# Patient Record
Sex: Female | Born: 1935 | Race: White | Hispanic: No | Smoking: Never smoker
Health system: Southern US, Community
[De-identification: ages and names within clinical notes are randomized; demographics above are authoritative.]

## PROBLEM LIST (undated history)

## (undated) DIAGNOSIS — R739 Hyperglycemia, unspecified: Secondary | ICD-10-CM

## (undated) DIAGNOSIS — Z789 Other specified health status: Secondary | ICD-10-CM

## (undated) DIAGNOSIS — I779 Disorder of arteries and arterioles, unspecified: Secondary | ICD-10-CM

## (undated) DIAGNOSIS — I739 Peripheral vascular disease, unspecified: Secondary | ICD-10-CM

## (undated) DIAGNOSIS — M25559 Pain in unspecified hip: Secondary | ICD-10-CM

## (undated) DIAGNOSIS — I340 Nonrheumatic mitral (valve) insufficiency: Secondary | ICD-10-CM

## (undated) DIAGNOSIS — M549 Dorsalgia, unspecified: Secondary | ICD-10-CM

## (undated) DIAGNOSIS — S2232XA Fracture of one rib, left side, initial encounter for closed fracture: Secondary | ICD-10-CM

## (undated) DIAGNOSIS — G8929 Other chronic pain: Secondary | ICD-10-CM

## (undated) DIAGNOSIS — R609 Edema, unspecified: Secondary | ICD-10-CM

## (undated) DIAGNOSIS — I2510 Atherosclerotic heart disease of native coronary artery without angina pectoris: Secondary | ICD-10-CM

## (undated) DIAGNOSIS — K5792 Diverticulitis of intestine, part unspecified, without perforation or abscess without bleeding: Secondary | ICD-10-CM

## (undated) DIAGNOSIS — E039 Hypothyroidism, unspecified: Secondary | ICD-10-CM

## (undated) DIAGNOSIS — I471 Supraventricular tachycardia, unspecified: Secondary | ICD-10-CM

## (undated) DIAGNOSIS — E669 Obesity, unspecified: Secondary | ICD-10-CM

## (undated) DIAGNOSIS — M5430 Sciatica, unspecified side: Secondary | ICD-10-CM

## (undated) DIAGNOSIS — I10 Essential (primary) hypertension: Secondary | ICD-10-CM

## (undated) DIAGNOSIS — IMO0002 Reserved for concepts with insufficient information to code with codable children: Secondary | ICD-10-CM

## (undated) DIAGNOSIS — S72009A Fracture of unspecified part of neck of unspecified femur, initial encounter for closed fracture: Secondary | ICD-10-CM

## (undated) DIAGNOSIS — J45909 Unspecified asthma, uncomplicated: Secondary | ICD-10-CM

## (undated) DIAGNOSIS — K219 Gastro-esophageal reflux disease without esophagitis: Secondary | ICD-10-CM

## (undated) DIAGNOSIS — R269 Unspecified abnormalities of gait and mobility: Secondary | ICD-10-CM

## (undated) DIAGNOSIS — F0390 Unspecified dementia without behavioral disturbance: Secondary | ICD-10-CM

## (undated) DIAGNOSIS — R9430 Abnormal result of cardiovascular function study, unspecified: Secondary | ICD-10-CM

## (undated) HISTORY — DX: Abnormal result of cardiovascular function study, unspecified: R94.30

## (undated) HISTORY — DX: Gastro-esophageal reflux disease without esophagitis: K21.9

## (undated) HISTORY — DX: Essential (primary) hypertension: I10

## (undated) HISTORY — DX: Nonrheumatic mitral (valve) insufficiency: I34.0

## (undated) HISTORY — DX: Fracture of unspecified part of neck of unspecified femur, initial encounter for closed fracture: S72.009A

## (undated) HISTORY — DX: Edema, unspecified: R60.9

## (undated) HISTORY — DX: Obesity, unspecified: E66.9

## (undated) HISTORY — DX: Atherosclerotic heart disease of native coronary artery without angina pectoris: I25.10

## (undated) HISTORY — DX: Reserved for concepts with insufficient information to code with codable children: IMO0002

## (undated) HISTORY — DX: Supraventricular tachycardia, unspecified: I47.10

## (undated) HISTORY — DX: Peripheral vascular disease, unspecified: I73.9

## (undated) HISTORY — DX: Unspecified asthma, uncomplicated: J45.909

## (undated) HISTORY — PX: ARTHROPLASTY W/ ARTHROSCOPY MEDIAL / LATERAL COMPARTMENT KNEE: SUR78

## (undated) HISTORY — DX: Supraventricular tachycardia: I47.1

## (undated) HISTORY — DX: Disorder of arteries and arterioles, unspecified: I77.9

## (undated) HISTORY — DX: Other specified health status: Z78.9

---

## 1970-09-20 HISTORY — PX: TUBAL LIGATION: SHX77

## 1981-07-22 HISTORY — PX: TOTAL ABDOMINAL HYSTERECTOMY: SHX209

## 1989-05-22 HISTORY — PX: SP ARTHRO THUMB*R*: HXRAD215

## 1993-12-20 HISTORY — PX: ANKLE FRACTURE SURGERY: SHX122

## 1996-04-29 HISTORY — PX: LAPAROSCOPIC CHOLECYSTECTOMY: SUR755

## 1998-01-13 ENCOUNTER — Other Ambulatory Visit: Admission: RE | Admit: 1998-01-13 | Discharge: 1998-01-13 | Payer: Self-pay | Admitting: Obstetrics and Gynecology

## 2000-12-08 ENCOUNTER — Encounter: Admission: RE | Admit: 2000-12-08 | Discharge: 2000-12-08 | Payer: Self-pay | Admitting: Internal Medicine

## 2001-11-19 ENCOUNTER — Ambulatory Visit (HOSPITAL_COMMUNITY): Admission: RE | Admit: 2001-11-19 | Discharge: 2001-11-19 | Payer: Self-pay | Admitting: Orthopaedic Surgery

## 2001-11-19 ENCOUNTER — Encounter: Payer: Self-pay | Admitting: Orthopaedic Surgery

## 2001-11-19 HISTORY — PX: KNEE ARTHROSCOPY: SHX127

## 2003-09-08 ENCOUNTER — Ambulatory Visit (HOSPITAL_COMMUNITY): Admission: RE | Admit: 2003-09-08 | Discharge: 2003-09-08 | Payer: Self-pay | Admitting: Orthopaedic Surgery

## 2003-11-19 ENCOUNTER — Emergency Department (HOSPITAL_COMMUNITY): Admission: EM | Admit: 2003-11-19 | Discharge: 2003-11-19 | Payer: Self-pay | Admitting: Emergency Medicine

## 2004-01-22 ENCOUNTER — Emergency Department (HOSPITAL_COMMUNITY): Admission: EM | Admit: 2004-01-22 | Discharge: 2004-01-22 | Payer: Self-pay | Admitting: Emergency Medicine

## 2004-05-28 ENCOUNTER — Ambulatory Visit (HOSPITAL_COMMUNITY): Admission: RE | Admit: 2004-05-28 | Discharge: 2004-05-28 | Payer: Self-pay | Admitting: Orthopaedic Surgery

## 2004-06-26 ENCOUNTER — Ambulatory Visit: Payer: Self-pay | Admitting: Family Medicine

## 2004-07-06 ENCOUNTER — Ambulatory Visit: Payer: Self-pay | Admitting: Cardiology

## 2004-08-05 ENCOUNTER — Emergency Department (HOSPITAL_COMMUNITY): Admission: EM | Admit: 2004-08-05 | Discharge: 2004-08-05 | Payer: Self-pay | Admitting: Emergency Medicine

## 2004-08-13 ENCOUNTER — Ambulatory Visit: Payer: Self-pay | Admitting: Family Medicine

## 2004-09-18 ENCOUNTER — Ambulatory Visit: Payer: Self-pay | Admitting: Family Medicine

## 2004-09-19 ENCOUNTER — Other Ambulatory Visit: Admission: RE | Admit: 2004-09-19 | Discharge: 2004-09-19 | Payer: Self-pay | Admitting: Orthopaedic Surgery

## 2004-10-03 ENCOUNTER — Ambulatory Visit: Payer: Self-pay | Admitting: Family Medicine

## 2004-10-19 ENCOUNTER — Ambulatory Visit: Payer: Self-pay | Admitting: Family Medicine

## 2004-10-20 ENCOUNTER — Emergency Department (HOSPITAL_COMMUNITY): Admission: EM | Admit: 2004-10-20 | Discharge: 2004-10-20 | Payer: Self-pay | Admitting: Emergency Medicine

## 2004-10-23 ENCOUNTER — Ambulatory Visit: Payer: Self-pay | Admitting: Family Medicine

## 2004-10-30 ENCOUNTER — Ambulatory Visit: Payer: Self-pay | Admitting: Family Medicine

## 2004-12-03 ENCOUNTER — Ambulatory Visit: Payer: Self-pay | Admitting: Family Medicine

## 2004-12-18 ENCOUNTER — Ambulatory Visit: Payer: Self-pay | Admitting: Family Medicine

## 2005-01-28 ENCOUNTER — Ambulatory Visit: Payer: Self-pay | Admitting: Cardiology

## 2005-02-19 ENCOUNTER — Ambulatory Visit: Payer: Self-pay | Admitting: Family Medicine

## 2005-05-22 ENCOUNTER — Ambulatory Visit: Payer: Self-pay | Admitting: Family Medicine

## 2005-12-11 ENCOUNTER — Ambulatory Visit: Payer: Self-pay | Admitting: Cardiology

## 2005-12-20 DIAGNOSIS — IMO0002 Reserved for concepts with insufficient information to code with codable children: Secondary | ICD-10-CM | POA: Insufficient documentation

## 2005-12-20 DIAGNOSIS — R9430 Abnormal result of cardiovascular function study, unspecified: Secondary | ICD-10-CM

## 2005-12-25 ENCOUNTER — Ambulatory Visit: Payer: Self-pay | Admitting: Cardiology

## 2006-01-06 ENCOUNTER — Ambulatory Visit: Payer: Self-pay | Admitting: Cardiology

## 2006-01-07 ENCOUNTER — Emergency Department (HOSPITAL_COMMUNITY): Admission: EM | Admit: 2006-01-07 | Discharge: 2006-01-07 | Payer: Self-pay | Admitting: Emergency Medicine

## 2006-02-28 ENCOUNTER — Emergency Department (HOSPITAL_COMMUNITY): Admission: EM | Admit: 2006-02-28 | Discharge: 2006-02-28 | Payer: Self-pay | Admitting: Emergency Medicine

## 2006-03-02 ENCOUNTER — Emergency Department (HOSPITAL_COMMUNITY): Admission: EM | Admit: 2006-03-02 | Discharge: 2006-03-02 | Payer: Self-pay | Admitting: Emergency Medicine

## 2006-04-10 ENCOUNTER — Ambulatory Visit: Payer: Self-pay | Admitting: Family Medicine

## 2006-05-22 ENCOUNTER — Ambulatory Visit: Payer: Self-pay | Admitting: Family Medicine

## 2006-07-03 ENCOUNTER — Ambulatory Visit: Payer: Self-pay | Admitting: Family Medicine

## 2007-04-24 ENCOUNTER — Ambulatory Visit: Payer: Self-pay | Admitting: Cardiology

## 2007-08-13 ENCOUNTER — Emergency Department (HOSPITAL_COMMUNITY): Admission: EM | Admit: 2007-08-13 | Discharge: 2007-08-13 | Payer: Self-pay | Admitting: Emergency Medicine

## 2008-06-20 ENCOUNTER — Ambulatory Visit: Payer: Self-pay | Admitting: Cardiology

## 2009-07-09 ENCOUNTER — Encounter: Payer: Self-pay | Admitting: Cardiology

## 2009-07-10 ENCOUNTER — Ambulatory Visit: Payer: Self-pay | Admitting: Cardiology

## 2009-07-12 ENCOUNTER — Ambulatory Visit (HOSPITAL_COMMUNITY): Admission: RE | Admit: 2009-07-12 | Discharge: 2009-07-12 | Payer: Self-pay | Admitting: Orthopaedic Surgery

## 2010-02-22 ENCOUNTER — Ambulatory Visit: Payer: Self-pay | Admitting: Cardiology

## 2010-02-22 ENCOUNTER — Inpatient Hospital Stay (HOSPITAL_COMMUNITY): Admission: EM | Admit: 2010-02-22 | Discharge: 2010-02-23 | Payer: Self-pay | Admitting: Emergency Medicine

## 2010-02-22 ENCOUNTER — Encounter: Payer: Self-pay | Admitting: Cardiology

## 2010-02-22 HISTORY — PX: CARDIAC CATHETERIZATION: SHX172

## 2010-02-23 ENCOUNTER — Encounter: Payer: Self-pay | Admitting: Cardiovascular Disease

## 2010-02-23 ENCOUNTER — Encounter: Payer: Self-pay | Admitting: Cardiology

## 2010-02-26 ENCOUNTER — Telehealth (INDEPENDENT_AMBULATORY_CARE_PROVIDER_SITE_OTHER): Payer: Self-pay | Admitting: *Deleted

## 2010-03-02 ENCOUNTER — Ambulatory Visit: Payer: Self-pay | Admitting: Cardiology

## 2010-03-13 ENCOUNTER — Telehealth (INDEPENDENT_AMBULATORY_CARE_PROVIDER_SITE_OTHER): Payer: Self-pay | Admitting: *Deleted

## 2010-03-20 ENCOUNTER — Encounter: Payer: Self-pay | Admitting: Cardiology

## 2010-03-29 ENCOUNTER — Ambulatory Visit: Payer: Self-pay | Admitting: Cardiology

## 2010-03-29 DIAGNOSIS — R609 Edema, unspecified: Secondary | ICD-10-CM

## 2010-03-29 HISTORY — DX: Edema, unspecified: R60.9

## 2010-04-03 ENCOUNTER — Telehealth: Payer: Self-pay | Admitting: Cardiology

## 2010-04-04 ENCOUNTER — Telehealth (INDEPENDENT_AMBULATORY_CARE_PROVIDER_SITE_OTHER): Payer: Self-pay | Admitting: *Deleted

## 2010-04-12 ENCOUNTER — Telehealth (INDEPENDENT_AMBULATORY_CARE_PROVIDER_SITE_OTHER): Payer: Self-pay | Admitting: *Deleted

## 2010-05-07 ENCOUNTER — Telehealth (INDEPENDENT_AMBULATORY_CARE_PROVIDER_SITE_OTHER): Payer: Self-pay | Admitting: *Deleted

## 2010-05-10 ENCOUNTER — Encounter (INDEPENDENT_AMBULATORY_CARE_PROVIDER_SITE_OTHER): Payer: Self-pay | Admitting: *Deleted

## 2010-05-10 ENCOUNTER — Inpatient Hospital Stay (HOSPITAL_COMMUNITY): Admission: EM | Admit: 2010-05-10 | Discharge: 2010-05-12 | Payer: Self-pay | Admitting: Emergency Medicine

## 2010-05-10 ENCOUNTER — Ambulatory Visit: Payer: Self-pay | Admitting: Internal Medicine

## 2010-05-11 ENCOUNTER — Ambulatory Visit: Payer: Self-pay | Admitting: Vascular Surgery

## 2010-05-11 ENCOUNTER — Encounter (INDEPENDENT_AMBULATORY_CARE_PROVIDER_SITE_OTHER): Payer: Self-pay | Admitting: Internal Medicine

## 2010-05-12 ENCOUNTER — Encounter (INDEPENDENT_AMBULATORY_CARE_PROVIDER_SITE_OTHER): Payer: Self-pay | Admitting: *Deleted

## 2010-05-21 ENCOUNTER — Encounter: Payer: Self-pay | Admitting: Cardiology

## 2010-06-11 ENCOUNTER — Ambulatory Visit: Payer: Self-pay | Admitting: Cardiology

## 2010-06-27 ENCOUNTER — Encounter: Payer: Self-pay | Admitting: Cardiology

## 2010-07-05 ENCOUNTER — Encounter: Payer: Self-pay | Admitting: Cardiology

## 2010-08-23 NOTE — Miscellaneous (Signed)
  Clinical Lists Changes  Problems: Added new problem of CAD (ICD-414.00) Added new problem of * ASPIRIN INTOLERANCE Observations: Added new observation of PAST MED HX: EF  65%...echo.Marland KitchenMarland Kitchen6/2007 /  normal... catheterization... August, 2011 mitral regurgitation...mild...Marland KitchenMarland Kitchenecho..12/2005 paroxysmal supraventricular tachycardia....infrequent episodes over the years...palpitations Hypertension. asthmatic bronchitis Obesity. CAD   catheterization  February 22, 2010.... bare-metal stents to 2 separate RCA lesions ( EF normal) .. residual 60% LAD.Marland Kitchen medical therapy.Marland KitchenMarland KitchenEffient for at least one month.... plan to change to Plavix and check P2Y12 at that time  ( patient does not tolerate aspirin) Aspirin intolerance (03/02/2010 8:05) Added new observation of PRIMARY MD: Ardeen Garland, MD (03/02/2010 8:05)       Past History:  Past Medical History: EF  65%...echo.Marland KitchenMarland Kitchen6/2007 /  normal... catheterization... August, 2011 mitral regurgitation...mild...Marland KitchenMarland Kitchenecho..12/2005 paroxysmal supraventricular tachycardia....infrequent episodes over the years...palpitations Hypertension. asthmatic bronchitis Obesity. CAD   catheterization  February 22, 2010.... bare-metal stents to 2 separate RCA lesions ( EF normal) .. residual 60% LAD.Marland Kitchen medical therapy.Marland KitchenMarland KitchenEffient for at least one month.... plan to change to Plavix and check P2Y12 at that time  ( patient does not tolerate aspirin) Aspirin intolerance

## 2010-08-23 NOTE — Medication Information (Signed)
Summary: RX Folder/ PANTOPRAZOLE SODIUM TABLET  RX Folder/ PANTOPRAZOLE SODIUM TABLET   Imported By: Dorise Hiss 06/28/2010 16:54:28  _____________________________________________________________________  External Attachment:    Type:   Image     Comment:   External Document

## 2010-08-23 NOTE — Assessment & Plan Note (Signed)
Summary: 1 MO F/U PER 8/12 OV-JM   Visit Type:  Follow-up Primary Provider:  Ardeen Garland, MD  CC:  CAD.  History of Present Illness: The patient is seen for follow up of coronary disease.  I saw her last on March 02, 2010.  Just prior to that visit she had received bare-metal stents separate places in her right coronary artery.  She was to use Effient for one month and she was then changed successfully to Plavix.  She does not tolerate aspirin.  She is doing very well.  She's not had any chest pain.  She has not had any shortness of breath.  She does mention that she's developed some mild edema.  This seems to be related to some recent increase in salt and fluid intake.  Current Medications (verified): 1)  Arthrotec 75 75-200 Mg-Mcg Tabs (Diclofenac-Misoprostol) .... Take 1 Tablet By Mouth Two Times A Day 2)  Flovent Hfa 44 Mcg/act Aero (Fluticasone Propionate  Hfa) .... 2 Puffs Two Times A Day As Needed 3)  Soya Lecithin 1200 Mg Caps (Lecithin) .... Take 1 Tablet By Mouth Once A Day 4)  Calcium-Magnesium-Zinc 333-133-8.3 Mg Tabs (Calcium-Magnesium-Zinc) .... Take 1 Tablet By Mouth Once A Day 5)  Norvasc 10 Mg Tabs (Amlodipine Besylate) .... Take 1 Tablet By Mouth Once A Day 6)  Protonix 40 Mg Tbec (Pantoprazole Sodium) .... Take 1 Capsule By Mouth Two Times A Day 7)  Lanoxin 0.25 Mg Tabs (Digoxin) .... Take 1 Tablet By Mouth Once A Day 8)  Toprol Xl 50 Mg Xr24h-Tab (Metoprolol Succinate) .... Take 1 Tablet By Mouth in Am and 2 in Pm 9)  Zyrtec Hives Relief 10 Mg Tabs (Cetirizine Hcl) .... Take 1/2 Tablet By Mouth Two Times A Day 10)  Alprazolam 0.5 Mg Tabs (Alprazolam) .... Take 1/2 Tablet By Mouth Once A Day As Needed 11)  Nitrostat 0.4 Mg Subl (Nitroglycerin) .... Use As Directed For Chest Pain Every Up To 3 Doses 12)  Tylenol 325 Mg Tabs (Acetaminophen) .... Take 2 By Mouth Every 4 Hours 13)  Simvastatin 40 Mg Tabs (Simvastatin) .... Take 1 Tablet By Mouth Once A Day 14)  Synthroid  50 Mcg Tabs (Levothyroxine Sodium) .... Take 1 Tablet By Mouth Once A Day 15)  Plavix 75 Mg Tabs (Clopidogrel Bisulfate) .... Take One Tablet By Mouth Daily  Allergies (verified): 1)  Asa 2)  Codeine 3)  Hydrocodone 4)  Celebrex 5)  * Advair  Past History:  Past Medical History: EF  65%...echo.Marland KitchenMarland Kitchen6/2007 /  normal... catheterization... August, 2011 mitral regurgitation...mild...Marland KitchenMarland Kitchenecho..12/2005 paroxysmal supraventricular tachycardia....infrequent episodes over the years...palpitations Hypertension. asthmatic bronchitis Obesity. CAD   catheterization  February 22, 2010.... bare-metal stents to 2 separate RCA lesions ( EF normal) .. residual 60% LAD.Marland Kitchen medical therapy.Marland KitchenMarland KitchenEffient for at least one month.... plan to change to Plavix and check P2Y12 at that time  ( patient does not tolerate aspirin) Aspirin intolerance GERD edema...Marland KitchenMarland KitchenMarland Kitchen March 29, 2010  Review of Systems       Patient denies fever, chills, headache, sweats, rash, change in vision, change in hearing, chest pain, cough, nausea vomiting, urinary symptoms.  All of the systems are reviewed and are negative  Vital Signs:  Patient profile:   77 year old female Height:      64 inches Weight:      164 pounds BMI:     28.25 Pulse rate:   64 / minute Resp:     16 per minute BP sitting:   152 /  83  (right arm)  Vitals Entered By: Marrion Coy, CNA (March 29, 2010 2:54 PM)  Physical Exam  General:  patient looks good today.  She is overweight. Head:  head is atraumatic. Eyes:  no xanthelasma. Neck:  no jugular venous distention. Chest Wall:  no chest wall tenderness. Lungs:  lungs are clear.  Respiratory effort is nonlabored. Heart:  cardiac exam reveals an S1-S2.  There no clicks or significant murmurs. Abdomen:  abdomen is soft. Msk:  no musculoskeletal deformities. Extremities:  there is 1+ edema in her feet Skin:  no skin rashes. Psych:  patient is oriented to person time and place.  Affect is  normal.   Impression & Recommendations:  Problem # 1:  EDEMA (ICD-782.3) This is a new problem today.  I feel she does not have evidence of congestive heart failure.  She has good left ventricular function.  She has had excess salt and fluid intake recently.  I've advised her to cut back on her salt and fluid.  If her edema does not improve she will be in touch with [place].  I've chosen not to start a diuretic today.  Problem # 2:  * ASPIRIN INTOLERANCE The patient is on Plavix and doing well.  Problem # 3:  CAD (ICD-414.00)  The following medications were removed from the medication list:    Effient 10 Mg Tabs (Prasugrel hcl) .Marland Kitchen... Take 1 tablet by mouth once a day Her updated medication list for this problem includes:    Norvasc 10 Mg Tabs (Amlodipine besylate) .Marland Kitchen... Take 1 tablet by mouth once a day    Toprol Xl 50 Mg Xr24h-tab (Metoprolol succinate) .Marland Kitchen... Take 1 tablet by mouth in am and 2 in pm    Nitrostat 0.4 Mg Subl (Nitroglycerin) ..... Use as directed for chest pain every up to 3 doses    Plavix 75 Mg Tabs (Clopidogrel bisulfate) .Marland Kitchen... Take one tablet by mouth daily Coronary disease is stable now.  No change in therapy.  Problem # 4:  OVERWEIGHT (ICD-278.02) Weight loss would be helpful.  Problem # 5:  HYPERTENSION (ICD-401.9)  Her updated medication list for this problem includes:    Norvasc 10 Mg Tabs (Amlodipine besylate) .Marland Kitchen... Take 1 tablet by mouth once a day    Toprol Xl 50 Mg Xr24h-tab (Metoprolol succinate) .Marland Kitchen... Take 1 tablet by mouth in am and 2 in pm Systolic pressure is slightly elevated today.  If she watches her salt and fluid intake I believe her pressure will improve.  Problem # 6:  SUPRAVENTRICULAR TACHYCARDIA (ICD-427.89)  The following medications were removed from the medication list:    Effient 10 Mg Tabs (Prasugrel hcl) .Marland Kitchen... Take 1 tablet by mouth once a day Her updated medication list for this problem includes:    Norvasc 10 Mg Tabs (Amlodipine  besylate) .Marland Kitchen... Take 1 tablet by mouth once a day    Toprol Xl 50 Mg Xr24h-tab (Metoprolol succinate) .Marland Kitchen... Take 1 tablet by mouth in am and 2 in pm    Nitrostat 0.4 Mg Subl (Nitroglycerin) ..... Use as directed for chest pain every up to 3 doses    Plavix 75 Mg Tabs (Clopidogrel bisulfate) .Marland Kitchen... Take one tablet by mouth daily Patient has not had any palpitations.  Patient Instructions: 1)  Your physician wants you to follow-up in: 3 months. You will receive a reminder letter in the mail one-two months in advance. If you don't receive a letter, please call our office to schedule  the follow-up appointment. 2)  Your physician recommends that you continue on your current medications as directed. Please refer to the Current Medication list given to you today.

## 2010-08-23 NOTE — Progress Notes (Signed)
Summary: GI Meds  Phone Note Call from Patient Call back at Home Phone [phone]   Summary of Call: Pt called stating the pharmacy told her she would need prior auth or to take OTC meds for reflux. She states the OTC meds don't help. Pt is aware Dr. Lysbeth Galas will manage. She states he will try to get prior auth. She just wanted to let us know. Initial call taken by: Cyril Loosen, RN, BSN,  May 07, 2010 4:16 PM

## 2010-08-23 NOTE — Medication Information (Signed)
Summary: RX Folder/ PANTOPRAZOLE AUTHORIZATION REQUEST  RX Folder/ PANTOPRAZOLE AUTHORIZATION REQUEST   Imported By: Dorise Hiss 06/12/2010 10:45:44  _____________________________________________________________________  External Attachment:    Type:   Image     Comment:   External Document

## 2010-08-23 NOTE — Miscellaneous (Signed)
  Clinical Lists Changes  Observations: Added new observation of CARDIO HPI: The patient will be taking Effient for one month after a bare-metal stent.  She does not tolerate aspirin.  She will be switched to Plavix at one month.  We are considering whether or not a lab test for P2Y12 needs to be checked.  It is likely that her reactivity to Plavix would be similar to her reactivity to aspirin.  His chart when we have not checked platelet reactivity to aspirin in this setting.  The patient will definitely be transitioned to Plavix.  I will decide after further discussions whether we will check P2Y12. (03/02/2010 15:07) Added new observation of PRIMARY MD: Ardeen Garland, MD (03/02/2010 15:07)      Primary Byrant Valent:  Ardeen Garland, MD   History of Present Illness: The patient will be taking Effient for one month after a bare-metal stent.  She does not tolerate aspirin.  She will be switched to Plavix at one month.  We are considering whether or not a lab test for P2Y12 needs to be checked.  It is likely that her reactivity to Plavix would be similar to her reactivity to aspirin.  His chart when we have not checked platelet reactivity to aspirin in this setting.  The patient will definitely be transitioned to Plavix.  I will decide after further discussions whether we will check P2Y12.

## 2010-08-23 NOTE — Assessment & Plan Note (Signed)
Summary: [facility]   Visit Type:  post hospitalization Primary Provider:  Ardeen Garland, MD  CC:  CAD.  History of Present Illness: The patient is seen for followup post hospitalization for coronary disease.  I followed her over time and she's been stable.  I saw her last in December, 2010.  However on February 22, 2010 the patient developed sudden chest tightness with diaphoresis.  She went immediately to Dr.Nyland's office and he immediately sent her by EMS to the hospital.  She went to the catheter lab that day.  She had 2 lesions in the right coronary artery and they were both stented with bare-metal stents.  There was residual 60% LAD lesion that was treated.  This is to be treated medically.  The patient does not tolerate aspirin.  She received Effient.  She will take this for one month and then Plavix will be started and we will check P2Y12 level.  She's here today feeling well.  She has some mild indigestion.  This is not her ischemic symptoms.  She had been on Nexium b.i.d. and this was changed to Protonix once a day related to her platelet therapy.  I will increase the Protonix b.i.d.  She's not had any chest pain.  Preventive Screening-Counseling & Management  Alcohol-Tobacco     Smoking Status: never  Current Medications (verified): 1)  Arthrotec 75 75-200 Mg-Mcg Tabs (Diclofenac-Misoprostol) .... Take 1 Tablet By Mouth Two Times A Day 2)  Flovent Hfa 44 Mcg/act Aero (Fluticasone Propionate  Hfa) .... 2 Puffs Two Times A Day As Needed 3)  Soya Lecithin 1200 Mg Caps (Lecithin) .... Take 1 Tablet By Mouth Once A Day 4)  Calcium-Magnesium-Zinc 333-133-8.3 Mg Tabs (Calcium-Magnesium-Zinc) .... Take 1 Tablet By Mouth Once A Day 5)  Norvasc 10 Mg Tabs (Amlodipine Besylate) .... Take 1 Tablet By Mouth Once A Day 6)  Protonix 40 Mg Tbec (Pantoprazole Sodium) .... Take 1 Tablet By Mouth Once A Day 7)  Lanoxin 0.25 Mg Tabs (Digoxin) .... Take 1 Tablet By Mouth Once A Day 8)  Toprol Xl 50 Mg  Xr24h-Tab (Metoprolol Succinate) .... Take 1 Tablet By Mouth in Am and 2 in Pm 9)  Zyrtec Hives Relief 10 Mg Tabs (Cetirizine Hcl) .... Take 1/2 Tablet By Mouth Two Times A Day 10)  Alprazolam 0.5 Mg Tabs (Alprazolam) .... Take 1/2 Tablet By Mouth Once A Day As Needed 11)  Nitrostat 0.4 Mg Subl (Nitroglycerin) .... Use As Directed For Chest Pain Every Up To 3 Doses 12)  Tylenol 325 Mg Tabs (Acetaminophen) .... Take 2 By Mouth Every 4 Hours 13)  Effient 10 Mg Tabs (Prasugrel Hcl) .... Take 1 Tablet By Mouth Once A Day 14)  Simvastatin 40 Mg Tabs (Simvastatin) .... Take 1 Tablet By Mouth Once A Day 15)  Synthroid 50 Mcg Tabs (Levothyroxine Sodium) .... Take 1 Tablet By Mouth Once A Day  Allergies (verified): 1)  Asa 2)  Codeine 3)  Hydrocodone 4)  Celebrex 5)  * Advair  Comments:  Nurse/Medical Assistant: The patient's medication list and allergies were reviewed with the patient and were updated in the Medication and Allergy Lists.  Past History:  Past Medical History: EF  65%...echo.Marland KitchenMarland Kitchen6/2007 /  normal... catheterization... August, 2011 mitral regurgitation...mild...Marland KitchenMarland Kitchenecho..12/2005 paroxysmal supraventricular tachycardia....infrequent episodes over the years...palpitations Hypertension. asthmatic bronchitis Obesity. CAD   catheterization  February 22, 2010.... bare-metal stents to 2 separate RCA lesions ( EF normal) .. residual 60% LAD.Marland Kitchen medical therapy.Marland KitchenMarland KitchenEffient for at least one month.Marland KitchenMarland KitchenMarland Kitchen  plan to change to Plavix and check P2Y12 at that time  ( patient does not tolerate aspirin) Aspirin intolerance GERD  Review of Systems       Patient denies fever, chills, headache, sweats, rash, change in vision, change in hearing, chest pain, cough, nausea vomiting, urinary symptoms.  All of the systems are reviewed and are negative.  Vital Signs:  Patient profile:   77 year old female Height:      64 inches Weight:      160 pounds Pulse rate:   68 / minute BP sitting:   152 / 85  (left  arm) Cuff size:   regular  Vitals Entered By: Carlye Grippe (March 02, 2010 1:07 PM)  Physical Exam  General:  patient looks quite good today. Head:  head is atraumatic. Eyes:  no xanthelasma. Neck:  no jugular venous distention. Chest Wall:  no chest wall tenderness. Lungs:  lungs are clear.  Respiratory effort is not labored. Heart:  cardiac exam reveals S1-S2.  No clicks or significant murmurs. Abdomen:  abdomen is soft. Msk:  no musculoskeletal deformities. Extremities:  the right groin catheter site is quite stable with no hematoma and no bruit.  There is no edema. Skin:  no skin rashes. Psych:  patient is oriented to person time and place.  Affect is normal.   Impression & Recommendations:  Problem # 1:  GERD (ICD-530.81)  The following medications were removed from the medication list:    Prilosec 20 Mg Cpdr (Omeprazole) .Marland Kitchen... Take 1 tablet by mouth once a day Her updated medication list for this problem includes:    Protonix 40 Mg Tbec (Pantoprazole sodium) .Marland Kitchen... Take 1 capsule by mouth two times a day The patient has GERD by history.  She had previously been on Nexium twice a day.  She is having some mild symptoms on Protonix once a day.  She will be allowed to increase the Protonix to b.i.d.  She may need further workup of this problem.  Problem # 2:  * ASPIRIN INTOLERANCE The patient cannot take aspirin.  She was treated with Effient. After she has taken this for 4 weeks she will be switched to Plavix and have labs checked P2Y12.  Problem # 3:  CAD (ICD-414.00)  Her updated medication list for this problem includes:    Norvasc 10 Mg Tabs (Amlodipine besylate) .Marland Kitchen... Take 1 tablet by mouth once a day    Toprol Xl 50 Mg Xr24h-tab (Metoprolol succinate) .Marland Kitchen... Take 1 tablet by mouth in am and 2 in pm    Nitrostat 0.4 Mg Subl (Nitroglycerin) ..... Use as directed for chest pain every up to 3 doses    Effient 10 Mg Tabs (Prasugrel hcl) .Marland Kitchen... Take 1 tablet by mouth  once a day    Plavix 75 Mg Tabs (Clopidogrel bisulfate) .Marland Kitchen... Take one tablet by mouth daily Coronary disease and a diagnosis.  She has not had this type of chest discomfort before.  EKG is done today and reviewed by me.  There is normal sinus rhythm and nonspecific ST-T wave abnormalities.  As part of evaluation today I have reviewed the hospital records including the discharge summary and the catheterization reports.  She is stable at this point.  They will be early followup to be sure that the transition to Plavix stable.  Problem # 4:  OVERWEIGHT (ICD-278.02) The patient has been losing weight and is feeling well.  Problem # 5:  HYPERTENSION (ICD-401.9)  Her updated medication list  for this problem includes:    Norvasc 10 Mg Tabs (Amlodipine besylate) .Marland Kitchen... Take 1 tablet by mouth once a day    Toprol Xl 50 Mg Xr24h-tab (Metoprolol succinate) .Marland Kitchen... Take 1 tablet by mouth in am and 2 in pm Systolic blood pressure is mildly elevated today.  I will not change her meds today but consider a change at the next visit.  Problem # 6:  SUPRAVENTRICULAR TACHYCARDIA (ICD-427.89)  Her updated medication list for this problem includes:    Norvasc 10 Mg Tabs (Amlodipine besylate) .Marland Kitchen... Take 1 tablet by mouth once a day    Toprol Xl 50 Mg Xr24h-tab (Metoprolol succinate) .Marland Kitchen... Take 1 tablet by mouth in am and 2 in pm    Nitrostat 0.4 Mg Subl (Nitroglycerin) ..... Use as directed for chest pain every up to 3 doses    Effient 10 Mg Tabs (Prasugrel hcl) .Marland Kitchen... Take 1 tablet by mouth once a day    Plavix 75 Mg Tabs (Clopidogrel bisulfate) .Marland Kitchen... Take one tablet by mouth daily The patient has not had any significant arrhythmias.  Other Orders: EKG w/ Interpretation (93000)  Patient Instructions: 1)  Follow up with Dr. Myrtis Ser on Thursday, Sept 8, 2011 at 2:45pm. 2)  Doreatha Martin currently supply of Effient. A prescription has been sent to your pharmacy for Plavix. DO NOT START THIS MEDICATION UNTIL INSTRUCTED  BY OUR OFFICE. If you have not heard anything from our office regarding this change when you have about 7 tablets of Efficent remaining, please contact our office. Prescriptions: PROTONIX 40 MG TBEC (PANTOPRAZOLE SODIUM) Take 1 capsule by mouth two times a day  #60 x 3   Entered by:   Cyril Loosen, RN, BSN   Authorized by:   Talitha Givens, MD, [facility]   Signed by:   Cyril Loosen, RN, BSN on 03/02/2010   Method used:   Electronically to        [facility]* (retail)       [address]       [place], [place]  [address]       Ph: [phone]       Fax:    RxID:   [id number] PLAVIX 75 MG TABS (CLOPIDOGREL BISULFATE) Take one tablet by mouth daily  #30 x 2   Entered by:   Cyril Loosen, RN, BSN   Authorized by:   Talitha Givens, MD, [facility]   Signed by:   Cyril Loosen, RN, BSN on 03/02/2010   Method used:   Electronically to        [facility]* (retail)       [address]       [place], [place]  [address]       Ph: [phone]       Fax:    RxID:   [id number]

## 2010-08-23 NOTE — Progress Notes (Signed)
Summary: Transitioning from Effient to Plavix  Phone Note Call from Patient Call back at [facility] Phone [phone]   Summary of Call: Pt has about 7 tablets of Effient remaining. She would like to know what she needs to do to transition over to the Plavix. Initial call taken by: Cyril Loosen, RN, BSN,  March 13, 2010 4:40 PM  Follow-up for Phone Call        I have decided she does not need other testing. She should start Plavix 75mg  daily the day after her last Effient dose is taken. Talitha Givens, MD, [facility]  March 14, 2010 8:00 AM   Additional Follow-up for Phone Call Additional follow up Details #1::        Pt notified and verbalized understanding.  Additional Follow-up by: Cyril Loosen, RN, BSN,  March 14, 2010 1:16 PM

## 2010-08-23 NOTE — Assessment & Plan Note (Signed)
Summary: POST [facility]E PER DGT'S CALL-JM   Visit Type:  Follow-up Referring Arden Axon:  Outlaw Primary Cresta Riden:  Katie Garland, MD  CC:  CAD.  History of Present Illness: The patient is seen for cardiology followup.  I saw her in the office last September 98, 2011.  Since that time she is hospitalized with GI symptoms.  She had received fair metal since the right coronary artery in August, 2011.  Patient had been treated with effient, but was then switched to Plavix.  She does not tolerate Plavix and historically does not tolerate aspirin.  Therefore she was switched back to effient at a lower dose of 5 mg as her long-term antiplatelet agent.  She now needs a colonoscopy.  We are asked her if it can be held for 5 days.  Preventive Screening-Counseling & Management  Alcohol-Tobacco     Smoking Status: never  Current Medications (verified): 1)  Arthrotec 75 75-200 Mg-Mcg Tabs (Diclofenac-Misoprostol) .... Take 1 Tablet By Mouth Two Times A Day 2)  Flovent Hfa 44 Mcg/act Aero (Fluticasone Propionate  Hfa) .... 2 Puffs Two Times A Day As Needed 3)  Soya Lecithin 1200 Mg Caps (Lecithin) .... Take 1 Tablet By Mouth Once A Day 4)  Calcium-Magnesium-Zinc 333-133-8.3 Mg Tabs (Calcium-Magnesium-Zinc) .... Take 1 Tablet By Mouth Once A Day 5)  Norvasc 10 Mg Tabs (Amlodipine Besylate) .... Take 1 Tablet By Mouth Once A Day 6)  Protonix 40 Mg Tbec (Pantoprazole Sodium) .... Take 1 Capsule By Mouth Two Times A Day 7)  Lanoxin 0.25 Mg Tabs (Digoxin) .... Take 1 Tablet By Mouth Once A Day 8)  Toprol Xl 50 Mg Xr24h-Tab (Metoprolol Succinate) .... Take 1 Tablet By Mouth in Am and 2 in Pm 9)  Zyrtec Hives Relief 10 Mg Tabs (Cetirizine Hcl) .... Take 1/2 Tablet By Mouth Two Times A Day 10)  Alprazolam 0.5 Mg Tabs (Alprazolam) .... Take 1/2 Tablet By Mouth Once A Day As Needed 11)  Nitrostat 0.4 Mg Subl (Nitroglycerin) .... Use As Directed For Chest Pain Every Up To 3 Doses 12)  Tylenol 325 Mg Tabs  (Acetaminophen) .... Take 1 Tablet By Mouth Four Times A Day 13)  Lipitor 20 Mg Tabs (Atorvastatin Calcium) .... Take 1 Tablet By Mouth Once A Day 14)  Synthroid 50 Mcg Tabs (Levothyroxine Sodium) .... Take 1 Tablet By Mouth Once A Day 15)  Effient 5 Mg Tabs (Prasugrel Hcl) .... Take 1 Tablet By Mouth Once A Day 16)  Murine Tears For Dry Eyes 5-6 Mg/ml Soln (Polyvinyl Alcohol-Povidone) .... One Drop Ou Every Morning  Allergies (verified): 1)  Asa 2)  Codeine 3)  Hydrocodone 4)  Celebrex 5)  * Advair  Comments:  Nurse/Medical Assistant: The patient's medication list and allergies were reviewed with the patient and were updated in the Medication and Allergy Lists.  Past History:  Past Medical History: EF  65%...echo.Marland KitchenMarland Kitchen6/2007 /  normal... catheterization... August, 2011 mitral regurgitation...mild...Marland KitchenMarland Kitchenecho..12/2005 paroxysmal supraventricular tachycardia....infrequent episodes over the years...palpitations Hypertension. asthmatic bronchitis Obesity. CAD   catheterization  February 22, 2010.... bare-metal stents to 2 separate RCA lesions ( EF normal) .. residual 60% LAD.Marland Kitchen medical therapy.Marland KitchenMarland KitchenEffient for at least one month.... plan to change to Plavix and check P2Y12 at that time  ( patient does not tolerate aspirin) /  does not tolerate Plavix with GI symptoms... switch back to efient. Aspirin intolerance Plavix intolerance GERD edema...Marland KitchenMarland KitchenMarland Kitchen March 29, 2010  Review of Systems       Patient denies fever, chills, headache,  sweats, rash, change in vision, change in hearing, chest pain, cough, nausea vomiting, urinary symptoms.  All of the systems are reviewed and are negative.  Vital Signs:  Patient profile:   77 year old female Height:      64 inches Weight:      163 pounds Pulse rate:   59 / minute BP sitting:   161 / 75  (left arm) Cuff size:   large  Vitals Entered By: Carlye Grippe (June 11, 2010 2:37 PM)  Physical Exam  General:  patient is stable. Eyes:  no  xanthelasma. Neck:  no jugular venous distention. Lungs:  lungs are clear.  Respiratory effort is not labored. Heart:  cardiac exam reveals S1-S2 present no clicks or significant murmurs. Abdomen:  abdomen is soft. Extremities:  no peripheral edema. Psych:  patient is oriented to person time and place.  Affect is normal.  She is here with a family member.   Impression & Recommendations:  Problem # 1:  EDEMA (ICD-782.3) There is no edema she is stable.  Problem # 2:  * ASPIRIN INTOLERANCE Patient has aspirin intolerance and Plavix intolerance.  She will be treated long term with Effient.  Problem # 3:  CAD (ICD-414.00)  Her updated medication list for this problem includes:    Norvasc 10 Mg Tabs (Amlodipine besylate) .Marland Kitchen... Take 1 tablet by mouth once a day    Toprol Xl 50 Mg Xr24h-tab (Metoprolol succinate) .Marland Kitchen... Take 1 tablet by mouth in am and 2 in pm    Nitrostat 0.4 Mg Subl (Nitroglycerin) ..... Use as directed for chest pain every up to 3 doses    Effient 5 Mg Tabs (Prasugrel hcl) .Marland Kitchen... Take 1 tablet by mouth once a day Coronary disease is stable.  No change in therapy.  Problem # 4:  * GI SYMPTOMATOLOGY The patient needs a colonoscopy.  It will be safe to hold her effient for 5 days and then restarted.  I'll see her back in 6 months for cardiology followup.  Patient Instructions: 1)  Your physician wants you to follow-up in: 6 months. You will receive a reminder letter in the mail one-two months in advance. If you don't receive a letter, please call our office to schedule the follow-up appointment. 2)  Your physician recommends that you continue on your current medications as directed. Please refer to the Current Medication list given to you today. Prescriptions: EFFIENT 5 MG TABS (PRASUGREL HCL) Take 1 tablet by mouth once a day  #30 x 6   Entered by:   Cyril Loosen, RN, BSN   Authorized by:   Talitha Givens, MD, [facility]   Signed by:   Cyril Loosen, RN, BSN on  06/11/2010   Method used:   Electronically to        [facility] [phone]* (retail)       [address] [place], [place]  [address]       Ph: [phone] or [phone]       Fax: [phone]   RxID:   [id number]

## 2010-08-23 NOTE — Progress Notes (Signed)
Summary: IBS  Phone Note Call from Patient Call back at Home Phone [phone]   Summary of Call: Pt called to notify the office that she had been in the hospital with abd pain and was diagnosed with IBS. Initial call taken by: Cyril Loosen, RN, BSN,  April 12, 2010 4:15 PM

## 2010-08-23 NOTE — Letter (Signed)
Summary: [facility]  Letter/ [facility]   Imported By: Dorise Hiss 06/12/2010 09:06:25  _____________________________________________________________________  External Attachment:    Type:   Image     Comment:   External Document

## 2010-08-23 NOTE — Progress Notes (Signed)
Summary: Tape at Cath insertion site  Phone Note Call from Patient Call back at Home Phone [phone]   Summary of Call: Pt contacted the office stating she had 2 stents placed on Thursday am. She states she has been cleaning the cath site and keeping it dry. She states there is a piece of tape that appears to be over actual insertion site rather than a residual piece of tape from dressing. She is not sure why this tape is there or if she should remove it. She was instructed to leave this in place until her appt with Dr. Myrtis Ser on Friday, August 12th. Pt instructed to go to ER for pain, swelling, redness, drainage, or other concerns. Pt verbalized understanding.  Initial call taken by: Cyril Loosen, RN, BSN,  February 26, 2010 9:50 AM

## 2010-08-23 NOTE — Progress Notes (Signed)
Summary: PLAVIX PROBLEMS/CALL FROM DAUGHTER  Phone Note Call from Patient Call back at [phone]   Caller: Daughter(Rhonda Para March) Summary of Call: Daughter called concerned also about problems that started 4 days after starting plavix and patient didn't let us know about all the problems. Daughter said patient also had small amount of nose bleeding and wanted her to go back on effient since she started having problems with stomach and nose bleeding after starting plavix. Also said there's some burning at rectum, & joint pain all over.  Patient increased fiber, and started maalox with no relief.  Nurse once again advised that patient tries miralax and call with response. Daughter verbalized understanding of plan. [phone] daughter cell number. Initial call taken by: Carlye Grippe,  April 04, 2010 8:58 AM  Follow-up for Phone Call        Agree  Talitha Givens, MD, [facility]  April 04, 2010 4:22 PM     Appended Document: PLAVIX PROBLEMS/CALL FROM Huel Cote called daughter back and she said that miralax made patient feel better and no more nose bleeding. nurse advised daughter to continue to monitor.

## 2010-08-23 NOTE — Progress Notes (Signed)
Summary: PHONE: MEDICATION PLAVIX  Phone Note Call from Patient Call back at Home Phone [phone]   Caller: Patient Summary of Call: Started taking Plavix on 9-4 causing patients stomach to swell, some constipation . Talked with pharmacy and they told her to contact our office Initial call taken by: Zachary George,  April 03, 2010 10:45 AM  Follow-up for Phone Call        Antony Contras,  Please call and try to figure this out with her. She is not on ASA. We must try to keep her on Plavix. If we can not, call me.      Appended Document: PHONE: MEDICATION PLAVIX Lydia, please help me with this. I realize now that Antony Contras is away.  Appended Document: PHONE: MEDICATION PLAVIX spoke with patient and advised patient to stay on plavix and try miralax for constipation. Patient stated that her PCP suggested miralax on yesterday also and she would try this and let us know the result.

## 2010-08-23 NOTE — Medication Information (Signed)
Summary: RX Folder/ FAXED [facility] PRIOR AUTHORIZATION  RX Folder/ FAXED [facility] PRIOR AUTHORIZATION   Imported By: Dorise Hiss 07/17/2010 12:01:54  _____________________________________________________________________  External Attachment:    Type:   Image     Comment:   External Document

## 2010-10-03 LAB — BASIC METABOLIC PANEL
BUN: 14 mg/dL (ref 6–23)
CO2: 27 mEq/L (ref 19–32)
CO2: 28 mEq/L (ref 19–32)
Calcium: 9.1 mg/dL (ref 8.4–10.5)
Chloride: 105 mEq/L (ref 96–112)
Chloride: 105 mEq/L (ref 96–112)
GFR calc Af Amer: 48 mL/min — ABNORMAL LOW (ref >60)
GFR calc non Af Amer: 40 mL/min — ABNORMAL LOW (ref >60)
Glucose, Bld: 110 mg/dL — ABNORMAL HIGH (ref 70–99)
Glucose, Bld: 158 mg/dL — ABNORMAL HIGH (ref 70–99)
Potassium: 4.1 mEq/L (ref 3.5–5.1)
Sodium: 137 mEq/L (ref 135–145)
Sodium: 137 mEq/L (ref 135–145)

## 2010-10-03 LAB — CBC
HCT: 36.1 % (ref 36.0–46.0)
HCT: 38.6 % (ref 36.0–46.0)
Hemoglobin: 11.6 g/dL — ABNORMAL LOW (ref 12.0–15.0)
Platelets: 273 10*3/uL (ref 150–400)
RBC: 4.16 MIL/uL (ref 3.87–5.11)
RBC: 4.36 MIL/uL (ref 3.87–5.11)
RDW: 13 % (ref 11.5–15.5)
RDW: 13.1 % (ref 11.5–15.5)
WBC: 10.6 10*3/uL — ABNORMAL HIGH (ref 4.0–10.5)

## 2010-10-03 LAB — URINE CULTURE: Colony Count: 3000

## 2010-10-03 LAB — DIFFERENTIAL
Basophils Relative: 1 % (ref 0–1)
Basophils Relative: 1 % (ref 0–1)
Eosinophils Absolute: 1 10*3/uL — ABNORMAL HIGH (ref 0.0–0.7)
Eosinophils Absolute: 1.5 10*3/uL — ABNORMAL HIGH (ref 0.0–0.7)
Monocytes Absolute: 1.2 10*3/uL — ABNORMAL HIGH (ref 0.1–1.0)
Monocytes Absolute: 1.2 10*3/uL — ABNORMAL HIGH (ref 0.1–1.0)
Monocytes Relative: 12 % (ref 3–12)
Neutrophils Relative %: 45 % (ref 43–77)
Neutrophils Relative %: 63 % (ref 43–77)

## 2010-10-03 LAB — COMPREHENSIVE METABOLIC PANEL
ALT: 17 U/L (ref 0–35)
Alkaline Phosphatase: 75 U/L (ref 39–117)
Chloride: 104 mEq/L (ref 96–112)
Glucose, Bld: 118 mg/dL — ABNORMAL HIGH (ref 70–99)
Potassium: 4.2 mEq/L (ref 3.5–5.1)
Sodium: 139 mEq/L (ref 135–145)
Total Bilirubin: 0.4 mg/dL (ref 0.3–1.2)
Total Protein: 6.7 g/dL (ref 6.0–8.3)

## 2010-10-03 LAB — MAGNESIUM: Magnesium: 2.2 mg/dL (ref 1.5–2.5)

## 2010-10-03 LAB — PROTIME-INR: Prothrombin Time: 13.6 seconds (ref 11.6–15.2)

## 2010-10-03 LAB — URINALYSIS, ROUTINE W REFLEX MICROSCOPIC
Glucose, UA: NEGATIVE mg/dL
Nitrite: NEGATIVE

## 2010-10-03 LAB — POCT CARDIAC MARKERS
CKMB, poc: <1 ng/mL — ABNORMAL LOW (ref 1.0–8.0)
Myoglobin, poc: 61.6 ng/mL (ref 12–200)
Troponin i, poc: <0.05 ng/mL (ref 0.00–0.09)

## 2010-10-03 LAB — APTT: aPTT: 28 seconds (ref 24–37)

## 2010-10-03 LAB — HEMOGLOBIN A1C
Hgb A1c MFr Bld: 6.4 % — ABNORMAL HIGH (ref <5.7)
Mean Plasma Glucose: 137 mg/dL — ABNORMAL HIGH (ref <117)

## 2010-10-03 LAB — TSH: TSH: 0.691 u[IU]/mL (ref 0.350–4.500)

## 2010-10-03 LAB — T4, FREE: Free T4: 1.5 ng/dL (ref 0.80–1.80)

## 2010-10-03 LAB — CARDIAC PANEL(CRET KIN+CKTOT+MB+TROPI)
CK, MB: 1 ng/mL (ref 0.3–4.0)
CK, MB: 1.1 ng/mL (ref 0.3–4.0)
Relative Index: INVALID (ref 0.0–2.5)
Relative Index: INVALID (ref 0.0–2.5)
Total CK: 44 U/L (ref 7–177)

## 2010-10-05 LAB — CARDIAC PANEL(CRET KIN+CKTOT+MB+TROPI)
CK, MB: 1.6 ng/mL (ref 0.3–4.0)
Relative Index: INVALID (ref 0.0–2.5)
Troponin I: 0.1 ng/mL — ABNORMAL HIGH (ref 0.00–0.06)
Troponin I: 0.21 ng/mL — ABNORMAL HIGH (ref 0.00–0.06)

## 2010-10-05 LAB — CBC
HCT: 41.6 % (ref 36.0–46.0)
Hemoglobin: 13.4 g/dL (ref 12.0–15.0)
MCH: 28.5 pg (ref 26.0–34.0)
MCH: 28.6 pg (ref 26.0–34.0)
MCHC: 32.2 g/dL (ref 30.0–36.0)
MCHC: 32.6 g/dL (ref 30.0–36.0)
MCV: 87.7 fL (ref 78.0–100.0)
Platelets: 275 10*3/uL (ref 150–400)
RDW: 13.2 % (ref 11.5–15.5)
WBC: 9.4 10*3/uL (ref 4.0–10.5)

## 2010-10-05 LAB — COMPREHENSIVE METABOLIC PANEL
ALT: 18 U/L (ref 0–35)
Alkaline Phosphatase: 84 U/L (ref 39–117)
CO2: 27 mEq/L (ref 19–32)
GFR calc non Af Amer: 43 mL/min — ABNORMAL LOW (ref >60)
Glucose, Bld: 122 mg/dL — ABNORMAL HIGH (ref 70–99)
Potassium: 4.6 mEq/L (ref 3.5–5.1)
Sodium: 136 mEq/L (ref 135–145)

## 2010-10-05 LAB — BASIC METABOLIC PANEL
Chloride: 103 mEq/L (ref 96–112)
Creatinine, Ser: 0.93 mg/dL (ref 0.4–1.2)
GFR calc Af Amer: >60 mL/min (ref >60)

## 2010-10-05 LAB — DIFFERENTIAL
Basophils Relative: 1 % (ref 0–1)
Eosinophils Absolute: 0.8 10*3/uL — ABNORMAL HIGH (ref 0.0–0.7)
Monocytes Relative: 11 % (ref 3–12)
Neutrophils Relative %: 61 % (ref 43–77)

## 2010-10-05 LAB — PROTIME-INR
INR: 0.99 (ref 0.00–1.49)
Prothrombin Time: 13.3 seconds (ref 11.6–15.2)

## 2010-10-05 LAB — LIPID PANEL: Triglycerides: 184 mg/dL — ABNORMAL HIGH (ref <150)

## 2010-12-04 NOTE — Assessment & Plan Note (Signed)
[facility]                          [facility] NOTE   NAME:Dority, MARWA FUHRMAN                    MRN:          [id number]  DATE:04/24/2007                            DOB:          1931-08-24    Ms. Colton is doing very well. She has a history of mild mitral valve  prolapse. She had a follow up echocardiogram in 2007. She actually has  no significant abnormality of the mitral valve, but very mild mitral  regurgitation. She has normal LV function. She has had some palpitations  with a history of paroxysmal supraventricular tachycardia, but she has  not had any prolonged episodes. She is exercising and feeling well.   PAST MEDICAL HISTORY:   ALLERGIES:  CODEINE and VICODIN.   MEDICATIONS:  1. Arthrotec.  2. Asmanex.  3. Calcium.  4. Nexium 40 mg b.i.d.  5. Lanoxin 0.25 mg.  6. Toprol XL 50 mg in the morning and 100 mg in the evening.  7. Mucinex.  8. Norvasc 10 mg.   OTHER MEDICAL PROBLEMS:  See the list below.   REVIEW OF SYSTEMS:  She is doing very well. Her asthma medicine is  working well and she is not having any significant problems. Her review  of systems is otherwise negative.   PHYSICAL EXAMINATION:  VITAL SIGNS:  Blood pressure 145/81, pulse 59.  Her weight is decreased to 168 pounds.  GENERAL:  The patient is oriented to person, time, and place. Affect is  normal.  HEENT:  Reveals no xanthelasma. She has normal extraocular motion. There  are no carotid bruits. There is no jugular venous distension.  LUNGS:  Clear. Respiratory effort is not labored.  CARDIAC:  Reveals an S1 with an S2. There is a 2/6 systolic murmur.  ABDOMEN:  The abdomen is soft. There are no masses or bruits.  EXTREMITIES:  She has no peripheral edema.  MUSCULOSKELETAL:  There are no significant musculoskeletal deformities.   EKG shows non-specific ST-T wave changes and is unchanged.   PROBLEMS:  1. History of mild mitral regurgitation.  2.  History of paroxysmal supraventricular tachycardia with very      infrequent episodes over the years and no further workup is needed      at this time.  3. Hypertension. At this point, her blood pressure is under good      control.  4. History of asthmatic bronchitis, well treated at this time.  5. Obesity. She has continued to lose weight slowly and she is doing      well.  6. Normal LV function.   No change in her medicines. I will see her back in one year.     Luis Abed, MD, [facility]  Electronically Signed    JDK/MedQ  DD: 04/24/2007  DT: 04/25/2007  Job #: [id number]   cc:   Delaney Meigs, M.D.

## 2010-12-04 NOTE — Assessment & Plan Note (Signed)
[facility]                          [facility]   NAME:Katie Ford                    MRN:          [id number]  DATE:06/20/2008                            DOB:          10-29-1931    Katie Ford was doing well.  She swims and has won a Oncologist with [employer].  She does have seasonal asthmatic bronchitis and is  having some at this time.  Dr. Lysbeth Galas is treating this.  She is not  having any significant arrhythmias.  We know she has mild mitral  regurgitation.   PAST MEDICAL HISTORY:   ALLERGIES:  CODEINE and VICODIN.   MEDICATIONS:  See the flow sheet.   REVIEW OF SYSTEMS:  She is not having any GI or GU symptoms.  She has no  headaches, fevers, or chills.  Her review of systems otherwise is  negative.   PHYSICAL EXAMINATION:  VITAL SIGNS:  Blood pressure is 115/72 with a  pulse of 60.  Weight is 164 pounds.  GENERAL:  The patient is oriented to person, time, and place.  Affect is  normal.  HEENT:  No xanthelasma.  She has normal extraocular motion.  NECK:  There are no carotid bruits.  There is no jugular venous  distention.  LUNGS:  Clear.  Respiratory effort is not labored.  CARDIAC:  An S1 with an S2.  There are no clicks or significant murmurs.  ABDOMEN:  Soft.  EXTREMITIES:  She has no peripheral edema.   EKG reveals sinus rhythm.   PROBLEMS:  1. History of mild mitral regurgitation.  She does not need an echo.  2. History of paroxysmal supraventricular tachycardia, stable.  3. Hypertension, treated well.  4. History of asthmatic bronchitis.  She is having some at this time      being treated.  5. Increased weight.  She is losing weight.  6. Normal left ventricular function.   No change in her meds.  She is doing well.  I can see her back in 1  year.     Luis Abed, MD, [facility]  Electronically Signed   JDK/MedQ  DD: 06/20/2008  DT: 06/21/2008  Job #: [id number]

## 2010-12-05 ENCOUNTER — Encounter: Payer: Self-pay | Admitting: Cardiology

## 2010-12-06 ENCOUNTER — Encounter: Payer: Self-pay | Admitting: Cardiology

## 2010-12-07 ENCOUNTER — Encounter: Payer: Self-pay | Admitting: Cardiology

## 2010-12-07 DIAGNOSIS — I471 Supraventricular tachycardia: Secondary | ICD-10-CM | POA: Insufficient documentation

## 2010-12-07 DIAGNOSIS — I340 Nonrheumatic mitral (valve) insufficiency: Secondary | ICD-10-CM | POA: Insufficient documentation

## 2010-12-07 DIAGNOSIS — I2510 Atherosclerotic heart disease of native coronary artery without angina pectoris: Secondary | ICD-10-CM | POA: Insufficient documentation

## 2010-12-07 DIAGNOSIS — K219 Gastro-esophageal reflux disease without esophagitis: Secondary | ICD-10-CM | POA: Insufficient documentation

## 2010-12-07 DIAGNOSIS — I10 Essential (primary) hypertension: Secondary | ICD-10-CM | POA: Insufficient documentation

## 2010-12-07 DIAGNOSIS — E669 Obesity, unspecified: Secondary | ICD-10-CM | POA: Insufficient documentation

## 2010-12-07 DIAGNOSIS — J45909 Unspecified asthma, uncomplicated: Secondary | ICD-10-CM | POA: Insufficient documentation

## 2010-12-10 ENCOUNTER — Ambulatory Visit (INDEPENDENT_AMBULATORY_CARE_PROVIDER_SITE_OTHER): Payer: Medicare Other | Admitting: Cardiology

## 2010-12-10 ENCOUNTER — Encounter: Payer: Self-pay | Admitting: Cardiology

## 2010-12-10 DIAGNOSIS — I10 Essential (primary) hypertension: Secondary | ICD-10-CM

## 2010-12-10 DIAGNOSIS — I2510 Atherosclerotic heart disease of native coronary artery without angina pectoris: Secondary | ICD-10-CM

## 2010-12-10 DIAGNOSIS — R609 Edema, unspecified: Secondary | ICD-10-CM

## 2010-12-10 NOTE — Assessment & Plan Note (Signed)
Coronary disease is stable.  She will remain on Effient as she does not tolerate Plavix and she does not tolerate aspirin.  No further testing is needed.

## 2010-12-10 NOTE — Assessment & Plan Note (Signed)
Blood pressure is reasonably controlled.  No change in therapy.  Also your back in 6 months.

## 2010-12-10 NOTE — Progress Notes (Signed)
HPI Patient is seen today for cardiology followup.  She is doing very well.  She received bare-metal stents to the right coronary artery in August, 2011.  She is intolerant to aspirin.  She had been Effient and the plan was to change her to Plavix at 30 days.  However she is intolerant to Plavix.  Therefore she will remain on Effient long-term because she can not use aspirin, she is very active and feeling well.  She's not having any chest discomfort. Allergies  Allergen Reactions  . Advair Hfa   . Aspirin     REACTION: Heart rate too fast  . Celecoxib   . Codeine   . Hydrocodone     Current Outpatient Prescriptions  Medication Sig Dispense Refill  . acetaminophen (TYLENOL) 325 MG tablet Take 325 mg by mouth every 6 (six) hours as needed.       Marland Kitchen albuterol (PROAIR HFA) 108 (90 BASE) MCG/ACT inhaler Inhale 2 puffs into the lungs every 6 (six) hours as needed.        . ALPRAZolam (XANAX) 0.5 MG tablet Take 0.25 mg by mouth daily as needed.       Marland Kitchen amLODipine (NORVASC) 10 MG tablet Take 10 mg by mouth daily.        Marland Kitchen CALCIUM-MAGNESUIUM-ZINC 333-133-8.3 MG TABS Take 1 tablet by mouth daily.       . cetirizine (ZYRTEC) 10 MG tablet Take 10 mg by mouth daily.        . diclofenac-misoprostol (ARTHROTEC 75) 75-0.2 MG per tablet Take 1 tablet by mouth 2 (two) times daily.        . digoxin (LANOXIN) 0.25 MG tablet Take 250 mcg by mouth daily.        . fluticasone (FLOVENT HFA) 44 MCG/ACT inhaler Inhale 1 puff into the lungs 2 (two) times daily.        Marland Kitchen levothyroxine (SYNTHROID, LEVOTHROID) 50 MCG tablet Take 50 mcg by mouth daily.        . metoprolol (TOPROL-XL) 50 MG 24 hr tablet Take 50 mg by mouth every morning. &  2 by mouth in evening      . nitroGLYCERIN (NITROSTAT) 0.4 MG SL tablet Place 0.4 mg under the tongue every 5 (five) minutes as needed.        . pantoprazole (PROTONIX) 40 MG tablet Take 40 mg by mouth daily.        . Polyvinyl Alcohol-Povidone (MURINE TEARS FOR DRY EYES) 5-6 MG/ML  SOLN Place 1 drop into both eyes as directed.        . prasugrel (EFFIENT) 5 MG TABS Take 5 mg by mouth daily.       Ermalinda Memos Lecithin 1200 MG CAPS Take 1 tablet by mouth daily.        Marland Kitchen DISCONTD: atorvastatin (LIPITOR) 20 MG tablet Take 20 mg by mouth daily.          History   Social History  . Marital Status: Widowed    Spouse Name: N/A    Number of Children: N/A  . Years of Education: N/A   Occupational History  . Not on file.   Social History Main Topics  . Smoking status: Never Smoker   . Smokeless tobacco: Never Used  . Alcohol Use: Not on file  . Drug Use: Not on file  . Sexually Active: Not on file   Other Topics Concern  . Not on file   Social History Narrative  . No narrative on file  No family history on file.  Past Medical History  Diagnosis Date  . Ejection fraction < 50% 12/2005    EF 65 %... normal... catheterization 02/2010.  . Mitral regurgitation     mild, echo, June, 2007  . Paroxysmal supraventricular tachycardia     infrequent episodes over the years..palpitations.  . Hypertension   . Asthmatic bronchitis   . Obesity   . CAD (coronary artery disease)     bare metal stents to 2 separate RCA lesions August, 2011, residual 60% LAD, medical therapy  /  plan was for effient for one month followed by Plavix,, but patient does not tolerate Plavix... therefore patient placed back on effient  . GERD (gastroesophageal reflux disease)   . Edema 03/29/2010    September, 2011  . Intolerance of drug     aspirin and plavix    No past surgical history on file.  ROS  Patient denies fever, chills, headache, sweats, rash, change in vision, change in hearing, chest pain, cough, nausea vomiting, urinary symptoms.  All other systems are reviewed and are negative.  PHYSICAL EXAM She looks quite good.  She is oriented to person time and place.  Affect is normal.  She is here with a family member.  Head is atraumatic.  There is no jugular venous distention.  Lungs  are clear.  Respiratory effort is not labored.  Cardiac exam reveals an S1-S2.  There are no clicks or significant murmurs.  The abdomen is soft there is no peripheral edema. Filed Vitals:   12/10/10 1009  BP: 156/81  Pulse: 51  Height: 5\' 4"  (1.626 m)  Weight: 161 lb (73.029 kg)    EKG EKG is not done today.  ASSESSMENT & PLAN

## 2010-12-10 NOTE — Patient Instructions (Signed)
Continue all current medications. Your physician wants you to follow up in: 6 months.  You will receive a reminder letter in the mail one-two months in advance.  If you don't receive a letter, please call our office to schedule the follow up appointment   

## 2010-12-10 NOTE — Assessment & Plan Note (Signed)
She does not have any edema.  No change in therapy.

## 2010-12-21 ENCOUNTER — Other Ambulatory Visit (HOSPITAL_COMMUNITY): Payer: Self-pay | Admitting: Orthopaedic Surgery

## 2010-12-21 DIAGNOSIS — M25561 Pain in right knee: Secondary | ICD-10-CM

## 2011-01-02 ENCOUNTER — Ambulatory Visit (HOSPITAL_COMMUNITY)
Admission: RE | Admit: 2011-01-02 | Discharge: 2011-01-02 | Disposition: A | Discharge status: Home / Self Care | Payer: Medicare Other | Source: Ambulatory Visit | Attending: Orthopaedic Surgery | Admitting: Orthopaedic Surgery

## 2011-01-02 DIAGNOSIS — M25569 Pain in unspecified knee: Secondary | ICD-10-CM | POA: Insufficient documentation

## 2011-01-02 DIAGNOSIS — S83289A Other tear of lateral meniscus, current injury, unspecified knee, initial encounter: Secondary | ICD-10-CM | POA: Insufficient documentation

## 2011-01-02 DIAGNOSIS — M7120 Synovial cyst of popliteal space [Baker], unspecified knee: Secondary | ICD-10-CM | POA: Insufficient documentation

## 2011-01-02 DIAGNOSIS — X58XXXA Exposure to other specified factors, initial encounter: Secondary | ICD-10-CM | POA: Insufficient documentation

## 2011-01-02 DIAGNOSIS — M25561 Pain in right knee: Secondary | ICD-10-CM

## 2011-01-03 ENCOUNTER — Other Ambulatory Visit: Payer: Self-pay | Admitting: Cardiology

## 2011-01-15 ENCOUNTER — Other Ambulatory Visit: Payer: Self-pay | Admitting: Cardiology

## 2011-02-01 ENCOUNTER — Other Ambulatory Visit: Payer: Self-pay | Admitting: Orthopaedic Surgery

## 2011-02-15 ENCOUNTER — Encounter (HOSPITAL_COMMUNITY)
Admission: RE | Admit: 2011-02-15 | Discharge: 2011-02-15 | Disposition: A | Discharge status: Home / Self Care | Payer: Medicare Other | Source: Ambulatory Visit | Attending: Orthopaedic Surgery | Admitting: Orthopaedic Surgery

## 2011-02-15 ENCOUNTER — Encounter (HOSPITAL_COMMUNITY): Payer: Self-pay

## 2011-02-15 LAB — DIFFERENTIAL
Lymphs Abs: 2.2 10*3/uL (ref 0.7–4.0)
Monocytes Relative: 12 % (ref 3–12)
Neutro Abs: 4.4 10*3/uL (ref 1.7–7.7)
Neutrophils Relative %: 45 % (ref 43–77)

## 2011-02-15 LAB — URINALYSIS, ROUTINE W REFLEX MICROSCOPIC
Glucose, UA: NEGATIVE mg/dL
Ketones, ur: NEGATIVE mg/dL
Protein, ur: NEGATIVE mg/dL

## 2011-02-15 LAB — CBC
Hemoglobin: 12.7 g/dL (ref 12.0–15.0)
RBC: 4.49 MIL/uL (ref 3.87–5.11)
WBC: 9.9 10*3/uL (ref 4.0–10.5)

## 2011-02-15 LAB — URINE MICROSCOPIC-ADD ON

## 2011-02-15 LAB — COMPREHENSIVE METABOLIC PANEL
ALT: 14 U/L (ref 0–35)
Albumin: 3.3 g/dL — ABNORMAL LOW (ref 3.5–5.2)
Alkaline Phosphatase: 96 U/L (ref 39–117)
BUN: 17 mg/dL (ref 6–23)
Chloride: 99 mEq/L (ref 96–112)
GFR calc Af Amer: >60 mL/min (ref >60)
Glucose, Bld: 146 mg/dL — ABNORMAL HIGH (ref 70–99)
Potassium: 4.6 mEq/L (ref 3.5–5.1)
Sodium: 137 mEq/L (ref 135–145)
Total Bilirubin: 0.3 mg/dL (ref 0.3–1.2)

## 2011-02-15 NOTE — Patient Instructions (Addendum)
[address] [place]  02/15/2011   Your procedure is scheduled on:  02/19/2011  Report to [facility].  Call this number if you have problems the morning of surgery: [phone]   Remember:   Do not eat food:After Midnight.  Do not drink clear liquids: After Midnight.  Take these medicines the morning of surgery with A SIP OF WATER: Xanax, Amlodipine, Lanoxin, Synthroid, Toprol, Protonix, tramadol   Do not wear jewelry, make-up or nail polish.  Do not bring valuables to the hospital.  Contacts, dentures or bridgework may not be worn into surgery.  Leave suitcase in the car. After surgery it may be brought to your room.  For patients admitted to the hospital, checkout time is 11:00 AM the day of discharge.   Patients discharged the day of surgery will not be allowed to drive home.  Name and phone number of your driver: driver  Special Instructions: CHG Shower Shower 2 days before surgery and 1 day before surgery with Hibiclens.   Please read over the following fact sheets that you were given: Pain Booklet, MRSA Information, Surgical Site Infection Prevention and Anesthesia Post-op Instructions   General Anesthetic, Adult A nurse specialized in giving anesthesia (anesthetist) or a doctor specialized in giving anesthesia (anesthesiologist) gave you a medicine that made you sleep while a procedure was performed. For as long as 24 hours following this procedure, you may feel:  Dizzy.   Weak.   Drowsy.  BEFORE YOUR ANESTHETIC OR SURGERY  You should be present one before your procedure or as directed by your caregiver. Check in at the admissions desk to fill out any necessary forms if you are not pre-registered. There may be consent forms or other paperwork to sign before the procedure.   There is a waiting area for your family while you are having your procedure.  LET YOUR CAREGIVERS KNOW ABOUT THE FOLLOWING  Allergies.  Medications taken including herbs, eye drops, over the  counter medications, dietary supplements, and creams.   Use of steroids (by mouth or creams).   Previous problems with anesthetics or novocaine.   Use of cigarettes, alcohol, or illicit drugs.   Possibility of pregnancy, if this applies.  History of blood clots (DVTs, pulmonary embolism).   History of bleeding or blood problems.   Previous surgery.   Family history (especially anesthetic problems).   Other health problems.  1.   1.  AFTER YOUR SURGERY 1. After surgery, you will be taken to the recovery area where a nurse will monitor your progress. You will be allowed to go home when you are awake, stable, taking fluids well, and without complications.  2.  3. For the first 24 hours following an anesthetic:   Have a responsible person with you.   Do not drive a car. If you are alone, do not take public transportation.   Do not drink alcohol.   Do not take medicine that has not been prescribed by your caregiver.   Do not sign important papers or make important decisions.   You may resume normal diet and activities as directed.   Change bandages (dressings) as directed.   Only take over-the-counter or prescription medicines for pain, discomfort, and or fever, as directed by your caregiver.  If you have questions or problems that seem related to the anesthetic, call the hospital and ask for the anesthetist or anesthesiologist on call. FINDING OUT THE RESULTS OF YOUR TEST Not all test results are available during  your visit. If your test results are not back during the visit, make an appointment with your caregiver to find out the results. Do not assume everything is normal if you have not heard from your caregiver or the medical facility. It is important for you to follow up on all of your test results. SEEK IMMEDIATE MEDICAL CARE IF:  You develop a rash.   You have difficulty breathing.   You have chest pain.   You have allergic problems.  Document Released:  10/15/2007 Document Re-Released: 10/02/2009 [facility] Patient Information 2011 [place], [place].

## 2011-02-18 NOTE — H&P (Signed)
Katie Ford, Katie Ford             ACCOUNT NO.:  [id number]  MEDICAL RECORD NO.:  [id number]  LOCATION:  [facility]                         FACILITY:  [facility]  PHYSICIAN:  J. Darreld Mclean, M.D. DATE OF BIRTH:  1932/03/06  DATE OF ADMISSION:  02/01/2011 DATE OF DISCHARGE:  LH                             HISTORY & PHYSICAL   CHIEF COMPLAINT:  Her right knee hurts.  HISTORY OF PRESENT ILLNESS:  The patient is a 77 year old female with pain and tenderness in her right knee for several months now.  She presented with swelling, giving way pain and tenderness of her right knee more laterally.  Patient had MRI of her right knee on January 02, 2011, which showed severe tricompartmental osteoarthritis.  She had a radial tear at the posterior horn and lateral meniscus.  She had changes in the medial meniscus prior meniscectomy.  She had a chronic complete ACL tear.  She had a small Baker's cyst.  Because of the tear of the lateral meniscus, I have recommended arthroscopy.  The patient has heart disease and has been on Effient 5 mg by mouth once a day from her cardiologist.  He suggested she stop that one week prior to surgery.  He also recommended overnight hospital admission for observation only to monitor heart to make sure she has no difficulty.  Due to her age and her heart disease, he felt it would be the safest course to have an overnight hospital stay.  I have arranged this.  I talked her about arthroscopy of the knee.  She is familiar having had it before.  She understands the risks and imponderables of the arthroscopy.  She understands also she has significant degenerative joint disease of the knee and that eventually she may need a total knee replacement.  She understands that not all her pain in her knee will be resolved by the surgery.  CURRENT MEDICATIONS:  Arthrotec 75 mg/200 mcg of misoprostol one twice a day, Flovent HFA 44 two puffs two times a day as needed, lecithin one tablet by  mouth daily, calcium, magnesium, zinc tablet one by mouth a day, Norvasc 10 mg by mouth once a day, Protonix 40 mg by mouth two times a day, Lanoxin 0.25 mg by mouth once a day, Toprol XL 50 mg XR 24 h by mouth, one in the morning and two in the evening, Zyrtec 10 mg one- half by mouth two times a day, alprazolam 0.5 mg one-half tablet by mouth as needed, Nitrostat 0.4 mg sublingual as necessary for chest pain, Tylenol 325 mg one tablet by mouth four times a day, Synthroid 50 mcg by mouth once a day, Imuran eye drops for dry eyes once in the right eye as needed every morning.  PREVIOUS SURGERY:  She has had right knee surgery arthroscopy by me in 2003, cholecystectomy in 1997, tubal ligation 1972, right ankle surgery 1990, right thumb surgery 1990, and a heart stent in 2011.  ALLERGIES:  She is allergic to ASPIRIN and MOST NONSTEROIDAL ANTI- INFLAMMATORIES.  Dr. Lysbeth Galas is her family doctor.  SOCIAL HISTORY:  Patient lives in [place].  She is a widow.  She is retired.  FAMILY HISTORY:  Heart disease,  diabetes run in the family.  She does not have diabetes as far as she knows.  REVIEW OF SYSTEMS:  Positive for coarse of the hypertension, heart disease, and shortness of breath at times.  She has a history of asthma. She also has history of acid reflux, thyroid disease.  She denies seizures, strokes, or weakness.  She denies abnormal blood work with anemia of the legs.  Last menstrual period was 35 years ago.  She had no fractures.  PHYSICAL EXAMINATION:  VITAL SIGNS: Patient's vital signs are normal limits.  She is alert, cooperative.  HEENT: Negative.  NECK: Supple. LUNGS: Clear to P and A.  HEART: Regular.  No murmur heard.  ABDOMEN: Soft, nontender without masses.  EXTREMITIES: Right knee has got mild effusion, pain, and tenderness laterally with positive Murray laterally. Knee has slight one-half drawer sign.  She has got mild degenerative joint changes in her fingers and some  crepitus in both knees.  Left knee does not hurt.  Her gait has slight limp to the right.  CNS: Intact. SKIN: Intact.  IMPRESSION: 1. Tear of lateral meniscus of the right knee. 2. History of heart disease. 3. History of hypertension. 4. History of gastric reflux. 5. History of thyroid problems.  PLAN:  Arthroscopy of the left knee and then have her admitted overnight for observation.  Her cardiologist recommendation can resume her blood thinner after surgery.  Patient is aware of the risks and imponderables. Labs are pending.                                           ______________________________ J. Darreld Mclean, M.D.     JWK/MEDQ  D:  02/18/2011  T:  02/18/2011  Job:  [id number]

## 2011-02-19 ENCOUNTER — Ambulatory Visit (HOSPITAL_COMMUNITY)
Admission: RE | Admit: 2011-02-19 | Discharge: 2011-02-20 | Disposition: A | Discharge status: Home / Self Care | Payer: Medicare Other | Source: Ambulatory Visit | Attending: Orthopaedic Surgery | Admitting: Orthopaedic Surgery

## 2011-02-19 ENCOUNTER — Encounter (HOSPITAL_COMMUNITY)
Admission: RE | Disposition: A | Discharge status: Home / Self Care | Payer: Self-pay | Source: Ambulatory Visit | Attending: Orthopaedic Surgery

## 2011-02-19 ENCOUNTER — Ambulatory Visit (HOSPITAL_COMMUNITY): Payer: Medicare Other | Admitting: Anesthesiology

## 2011-02-19 ENCOUNTER — Encounter (HOSPITAL_COMMUNITY): Payer: Self-pay | Admitting: Anesthesiology

## 2011-02-19 ENCOUNTER — Other Ambulatory Visit: Payer: Self-pay | Admitting: Orthopaedic Surgery

## 2011-02-19 ENCOUNTER — Encounter (HOSPITAL_COMMUNITY): Payer: Self-pay | Admitting: *Deleted

## 2011-02-19 DIAGNOSIS — M23302 Other meniscus derangements, unspecified lateral meniscus, unspecified knee: Secondary | ICD-10-CM | POA: Insufficient documentation

## 2011-02-19 DIAGNOSIS — Z01812 Encounter for preprocedural laboratory examination: Secondary | ICD-10-CM | POA: Insufficient documentation

## 2011-02-19 DIAGNOSIS — IMO0002 Reserved for concepts with insufficient information to code with codable children: Secondary | ICD-10-CM | POA: Insufficient documentation

## 2011-02-19 DIAGNOSIS — J45909 Unspecified asthma, uncomplicated: Secondary | ICD-10-CM | POA: Insufficient documentation

## 2011-02-19 DIAGNOSIS — I2510 Atherosclerotic heart disease of native coronary artery without angina pectoris: Secondary | ICD-10-CM | POA: Insufficient documentation

## 2011-02-19 DIAGNOSIS — I10 Essential (primary) hypertension: Secondary | ICD-10-CM | POA: Insufficient documentation

## 2011-02-19 DIAGNOSIS — Z79899 Other long term (current) drug therapy: Secondary | ICD-10-CM | POA: Insufficient documentation

## 2011-02-19 DIAGNOSIS — K219 Gastro-esophageal reflux disease without esophagitis: Secondary | ICD-10-CM | POA: Insufficient documentation

## 2011-02-19 DIAGNOSIS — M1710 Unilateral primary osteoarthritis, unspecified knee: Secondary | ICD-10-CM | POA: Insufficient documentation

## 2011-02-19 HISTORY — PX: KNEE ARTHROSCOPY: SHX127

## 2011-02-19 LAB — GLUCOSE, CAPILLARY: Glucose-Capillary: 144 mg/dL — ABNORMAL HIGH (ref 70–99)

## 2011-02-19 SURGERY — ARTHROSCOPY, KNEE
Anesthesia: Choice | Site: Knee | Laterality: Right | Wound class: Clean

## 2011-02-19 MED ORDER — NITROGLYCERIN 0.4 MG SL SUBL: 0.4000 mg | SUBLINGUAL_TABLET | SUBLINGUAL | Status: DC | PRN

## 2011-02-19 MED ORDER — MIDAZOLAM HCL 2 MG/2ML IJ SOLN
1.0000 mg | INTRAMUSCULAR | Status: DC | PRN
  Administered 2011-02-19: 2 mg via INTRAVENOUS

## 2011-02-19 MED ORDER — MORPHINE SULFATE 4 MG/ML IJ SOLN
4.0000 mg | INTRAMUSCULAR | Status: DC | PRN
  Administered 2011-02-19 – 2011-02-20 (×4): 4 mg via INTRAVENOUS

## 2011-02-19 MED ORDER — POLYVINYL ALCOHOL-POVIDONE 5-6 MG/ML OP SOLN: 1.0000 [drp] | OPHTHALMIC | Status: DC

## 2011-02-19 MED ORDER — GUAIFENESIN ER 600 MG PO TB12: 1200.0000 mg | ORAL_TABLET | Freq: Every day | ORAL | Status: DC

## 2011-02-19 MED ORDER — LACTATED RINGERS IR SOLN
Status: DC | PRN
  Administered 2011-02-19: 3000 mL

## 2011-02-19 MED ORDER — PROPOFOL 10 MG/ML IV EMUL
INTRAVENOUS | Status: DC | PRN
  Administered 2011-02-19: 25 ug/kg/min via INTRAVENOUS

## 2011-02-19 MED ORDER — FENTANYL CITRATE 0.05 MG/ML IJ SOLN
INTRAMUSCULAR | Status: DC | PRN
  Administered 2011-02-19: 25 ug via INTRATHECAL

## 2011-02-19 MED ORDER — ALPRAZOLAM 0.5 MG PO TABS
0.5000 mg | ORAL_TABLET | Freq: Two times a day (BID) | ORAL | Status: DC | PRN
  Administered 2011-02-19 – 2011-02-20 (×2): 0.5 mg via ORAL
  Filled 2011-02-19: qty 1

## 2011-02-19 MED ORDER — PROMETHAZINE HCL 25 MG/ML IJ SOLN: 12.5000 mg | INTRAMUSCULAR | Status: DC | PRN

## 2011-02-19 MED ORDER — LIDOCAINE HCL (PF) 1 % IJ SOLN
INTRAMUSCULAR | Status: AC
  Filled 2011-02-19: qty 5

## 2011-02-19 MED ORDER — BUPIVACAINE HCL (PF) 0.25 % IJ SOLN
INTRAMUSCULAR | Status: AC
  Filled 2011-02-19: qty 30

## 2011-02-19 MED ORDER — LEVOTHYROXINE SODIUM 50 MCG PO TABS: 50.0000 ug | ORAL_TABLET | Freq: Every day | ORAL | Status: DC

## 2011-02-19 MED ORDER — POLYVINYL ALCOHOL 1.4 % OP SOLN
1.0000 [drp] | OPHTHALMIC | Status: DC
  Administered 2011-02-19 (×2): 1 [drp] via OPHTHALMIC

## 2011-02-19 MED ORDER — PROPOFOL 10 MG/ML IV EMUL
INTRAVENOUS | Status: AC
  Filled 2011-02-19: qty 20

## 2011-02-19 MED ORDER — LACTATED RINGERS IV SOLN
INTRAVENOUS | Status: DC
  Administered 2011-02-19: 1000 mL via INTRAVENOUS

## 2011-02-19 MED ORDER — FENTANYL CITRATE 0.05 MG/ML IJ SOLN: 25.0000 ug | INTRAMUSCULAR | Status: DC | PRN

## 2011-02-19 MED ORDER — FLUTICASONE PROPIONATE HFA 44 MCG/ACT IN AERO
1.0000 | INHALATION_SPRAY | Freq: Two times a day (BID) | RESPIRATORY_TRACT | Status: DC
  Administered 2011-02-19 – 2011-02-20 (×2): 1 via RESPIRATORY_TRACT

## 2011-02-19 MED ORDER — MIDAZOLAM HCL 5 MG/5ML IJ SOLN
INTRAMUSCULAR | Status: DC | PRN
  Administered 2011-02-19: 2 mg via INTRAVENOUS

## 2011-02-19 MED ORDER — ACETAMINOPHEN 325 MG PO TABS
325.0000 mg | ORAL_TABLET | Freq: Four times a day (QID) | ORAL | Status: DC | PRN
  Administered 2011-02-19: 325 mg via ORAL
  Filled 2011-02-19: qty 1

## 2011-02-19 MED ORDER — METOPROLOL SUCCINATE ER 50 MG PO TB24: 100.0000 mg | ORAL_TABLET | Freq: Every day | ORAL | Status: DC

## 2011-02-19 MED ORDER — LORATADINE 10 MG PO TABS: 10.0000 mg | ORAL_TABLET | Freq: Every day | ORAL | Status: DC

## 2011-02-19 MED ORDER — FENTANYL CITRATE 0.05 MG/ML IJ SOLN
INTRAMUSCULAR | Status: DC | PRN
  Administered 2011-02-19: 25 ug via INTRAVENOUS
  Administered 2011-02-19: 50 ug via INTRAVENOUS

## 2011-02-19 MED ORDER — CHLORHEXIDINE GLUCONATE CLOTH 2 % EX PADS
6.0000 | MEDICATED_PAD | Freq: Every day | CUTANEOUS | Status: DC
  Administered 2011-02-20: 6 via TOPICAL
  Filled 2011-02-19: qty 6

## 2011-02-19 MED ORDER — DIGOXIN 250 MCG PO TABS: 250.0000 ug | ORAL_TABLET | Freq: Every day | ORAL | Status: DC

## 2011-02-19 MED ORDER — METOPROLOL SUCCINATE ER 50 MG PO TB24
50.0000 mg | ORAL_TABLET | ORAL | Status: DC
  Administered 2011-02-20: 50 mg via ORAL

## 2011-02-19 MED ORDER — ONDANSETRON HCL 4 MG/2ML IJ SOLN: 4.0000 mg | Freq: Once | INTRAMUSCULAR | Status: DC | PRN

## 2011-02-19 MED ORDER — LIDOCAINE IN DEXTROSE 5-7.5 % IV SOLN
INTRAVENOUS | Status: AC
  Filled 2011-02-19: qty 2

## 2011-02-19 MED ORDER — SODIUM CHLORIDE 0.9 % IR SOLN
Status: DC | PRN
  Administered 2011-02-19: 1000 mL

## 2011-02-19 MED ORDER — AMLODIPINE BESYLATE 5 MG PO TABS: 10.0000 mg | ORAL_TABLET | Freq: Every day | ORAL | Status: DC

## 2011-02-19 MED ORDER — TRAMADOL HCL 50 MG PO TABS
100.0000 mg | ORAL_TABLET | ORAL | Status: DC | PRN
  Administered 2011-02-19 – 2011-02-20 (×2): 100 mg via ORAL

## 2011-02-19 MED ORDER — LIDOCAINE HCL (PF) 0.5 % IJ SOLN
INTRAMUSCULAR | Status: DC | PRN
  Administered 2011-02-19: 75 mg via INTRATHECAL

## 2011-02-19 MED ORDER — MUPIROCIN 2 % EX OINT
TOPICAL_OINTMENT | Freq: Two times a day (BID) | CUTANEOUS | Status: DC
  Administered 2011-02-19: 22:00:00 via NASAL

## 2011-02-19 MED ORDER — BUPIVACAINE HCL (PF) 0.25 % IJ SOLN
INTRAMUSCULAR | Status: DC | PRN
  Administered 2011-02-19: 10 mL

## 2011-02-19 MED ORDER — CHLORHEXIDINE GLUCONATE CLOTH 2 % EX PADS: 6.0000 | MEDICATED_PAD | Freq: Every day | CUTANEOUS | Status: DC

## 2011-02-19 MED ORDER — ALBUTEROL SULFATE HFA 108 (90 BASE) MCG/ACT IN AERS
2.0000 | INHALATION_SPRAY | Freq: Two times a day (BID) | RESPIRATORY_TRACT | Status: DC
  Administered 2011-02-20: 2 via RESPIRATORY_TRACT

## 2011-02-19 MED ORDER — PANTOPRAZOLE SODIUM 40 MG PO TBEC
40.0000 mg | DELAYED_RELEASE_TABLET | Freq: Two times a day (BID) | ORAL | Status: DC
  Administered 2011-02-19: 40 mg via ORAL

## 2011-02-19 MED ORDER — ALBUTEROL SULFATE HFA 108 (90 BASE) MCG/ACT IN AERS
2.0000 | INHALATION_SPRAY | Freq: Four times a day (QID) | RESPIRATORY_TRACT | Status: DC | PRN
  Administered 2011-02-19: 2 via RESPIRATORY_TRACT

## 2011-02-19 MED ORDER — MIDAZOLAM HCL 2 MG/2ML IJ SOLN
INTRAMUSCULAR | Status: AC
  Filled 2011-02-19: qty 2

## 2011-02-19 MED ORDER — TRAMADOL HCL 50 MG PO TABS
50.0000 mg | ORAL_TABLET | ORAL | Status: DC | PRN
  Administered 2011-02-19: 50 mg via ORAL
  Filled 2011-02-19: qty 1

## 2011-02-19 MED ORDER — METOPROLOL SUCCINATE ER 50 MG PO TB24
100.0000 mg | ORAL_TABLET | Freq: Every day | ORAL | Status: DC
  Administered 2011-02-19: 100 mg via ORAL

## 2011-02-19 MED ORDER — FENTANYL CITRATE 0.05 MG/ML IJ SOLN
INTRAMUSCULAR | Status: AC
  Filled 2011-02-19: qty 2

## 2011-02-19 MED ORDER — MIDAZOLAM HCL 2 MG/2ML IJ SOLN
INTRAMUSCULAR | Status: AC
  Administered 2011-02-19: 2 mg via INTRAVENOUS
  Filled 2011-02-19: qty 2

## 2011-02-19 SURGICAL SUPPLY — 59 items
BAG HAMPER (MISCELLANEOUS) ×3 IMPLANT
BANDAGE ELASTIC 6 VELCRO NS (GAUZE/BANDAGES/DRESSINGS) ×3 IMPLANT
BANDAGE ELASTIC 6 VELCRO ST LF (GAUZE/BANDAGES/DRESSINGS) ×3 IMPLANT
BANDAGE ESMARK 4X12 BL STRL LF (DISPOSABLE) ×2 IMPLANT
BLADE SURG 15 STRL LF DISP TIS (BLADE) ×2 IMPLANT
BLADE SURG 15 STRL SS (BLADE) ×1
BLADE SURG SZ11 CARB STEEL (BLADE) ×3 IMPLANT
BNDG ESMARK 4X12 BLUE STRL LF (DISPOSABLE) ×3
CLOTH BEACON ORANGE TIMEOUT ST (SAFETY) ×6 IMPLANT
CUFF TOURNIQUET SINGLE 34IN LL (TOURNIQUET CUFF) ×3 IMPLANT
CUFF TOURNIQUET SINGLE 44IN (TOURNIQUET CUFF) IMPLANT
CUTTER TOMCAT 4.0M (BURR) ×3 IMPLANT
DECANTER SPIKE VIAL GLASS SM (MISCELLANEOUS) ×3 IMPLANT
DRAPE PROXIMA HALF (DRAPES) ×6 IMPLANT
DRSG XEROFORM 1X8 (GAUZE/BANDAGES/DRESSINGS) ×3 IMPLANT
ELECT MENISCUS 165MM 90D (ELECTRODE) IMPLANT
FLOOR PAD 36X40 (MISCELLANEOUS) ×3
FORMALIN 10 PREFIL 480ML (MISCELLANEOUS) ×3 IMPLANT
GAUZE SPONGE 4X4 16PLY XRAY LF (GAUZE/BANDAGES/DRESSINGS) ×3 IMPLANT
GLOVE BIO SURGEON STRL SZ8 (GLOVE) ×3 IMPLANT
GLOVE BIO SURGEON STRL SZ8.5 (GLOVE) ×3 IMPLANT
GLOVE BIOGEL PI IND STRL 7.0 (GLOVE) ×2 IMPLANT
GLOVE BIOGEL PI INDICATOR 7.0 (GLOVE) ×1
GLOVE ECLIPSE 6.5 STRL STRAW (GLOVE) ×3 IMPLANT
GOWN BRE IMP SLV AUR XL STRL (GOWN DISPOSABLE) ×3 IMPLANT
GOWN STRL REIN XL XLG (GOWN DISPOSABLE) ×3 IMPLANT
HANDPIECE (MISCELLANEOUS) IMPLANT
HLDR LEG FOAM (MISCELLANEOUS) ×2 IMPLANT
IV LACTATED RINGER IRRG 3000ML (IV SOLUTION) ×4
IV LR IRRIG 3000ML ARTHROMATIC (IV SOLUTION) ×8 IMPLANT
KIT BLADEGUARD II DBL (SET/KITS/TRAYS/PACK) ×3 IMPLANT
KIT ROOM TURNOVER AP CYSTO (KITS) ×3 IMPLANT
KNIFE HOOK (BLADE) IMPLANT
KNIFE MENISECTOMY (BLADE) ×3 IMPLANT
LEG HOLDER FOAM (MISCELLANEOUS) ×1
MANIFOLD NEPTUNE II (INSTRUMENTS) ×3 IMPLANT
MANIFOLD NEPTUNE WASTE (CANNULA) ×3 IMPLANT
MARKER SKIN DUAL TIP RULER LAB (MISCELLANEOUS) ×3 IMPLANT
NEEDLE HYPO 21X1.5 SAFETY (NEEDLE) ×3 IMPLANT
NEEDLE SPNL 18GX3.5 QUINCKE PK (NEEDLE) ×3 IMPLANT
NS IRRIG 1000ML POUR BTL (IV SOLUTION) ×3 IMPLANT
PACK ARTHRO LIMB DRAPE STRL (MISCELLANEOUS) ×3 IMPLANT
PAD ABD 5X9 TENDERSORB (GAUZE/BANDAGES/DRESSINGS) ×3 IMPLANT
PAD ARMBOARD 7.5X6 YLW CONV (MISCELLANEOUS) ×3 IMPLANT
PAD FLOOR 36X40 (MISCELLANEOUS) ×2 IMPLANT
PADDING CAST COTTON 6X4 STRL (CAST SUPPLIES) ×3 IMPLANT
SET ARTHROSCOPY INST (INSTRUMENTS) ×3 IMPLANT
SET BASIN LINEN APH (SET/KITS/TRAYS/PACK) ×3 IMPLANT
SOL PREP PROV IODINE SCRUB 4OZ (MISCELLANEOUS) ×3 IMPLANT
SPONGE GAUZE 4X4 12PLY (GAUZE/BANDAGES/DRESSINGS) ×3 IMPLANT
SUT ETHILON 3 0 FSL (SUTURE) ×3 IMPLANT
SYR 30ML LL (SYRINGE) ×3 IMPLANT
TIP 0 DEGREE (MISCELLANEOUS) IMPLANT
TIP 30 DEGREE (MISCELLANEOUS) IMPLANT
TIP 70 DEGREE (MISCELLANEOUS) IMPLANT
TOWEL OR 17X26 4PK STRL BLUE (TOWEL DISPOSABLE) ×3 IMPLANT
TUBING FLOSTEADY ARTHRO PUMP (IRRIGATION / IRRIGATOR) ×3 IMPLANT
WATER STERILE IRR 1000ML POUR (IV SOLUTION) ×3 IMPLANT
YANKAUER SUCT BULB TIP 10FT TU (MISCELLANEOUS) ×6 IMPLANT

## 2011-02-19 NOTE — Anesthesia Postprocedure Evaluation (Signed)
  Anesthesia Post-op Note  Patient: Katie Ford  Procedure(s) Performed:  ARTHROSCOPY KNEE - Medial and Lateral Partial Menisectomy  Patient Location: PACU  Anesthesia Type: Spinal  Level of Consciousness: alert   Airway and Oxygen Therapy: Patient Spontanous Breathing  Post-op Pain: none  Post-op Assessment: Patient's Cardiovascular Status Stable, Respiratory Function Stable, Patent Airway and No signs of Nausea or vomiting  Post-op Vital Signs: stable  Complications: No apparent anesthesia complications

## 2011-02-19 NOTE — Progress Notes (Signed)
The History and Physical has not changed since dictated yesterday. She is medically able to have surgery of the right knee. She will be placed in observation post op per recommendation of her cardiologist.

## 2011-02-19 NOTE — Transfer of Care (Signed)
Immediate Anesthesia Transfer of Care Note  Patient: Katie Ford  Procedure(s) Performed:  ARTHROSCOPY KNEE - Medial and Lateral Partial Menisectomy  Patient Location: PACU  Anesthesia Type: Spinal  Level of Consciousness: awake, alert  and oriented  Airway & Oxygen Therapy: Patient Spontanous Breathing and Patient connected to nasal cannula oxygen  Post-op Assessment: Report given to PACU RN and Post -op Vital signs reviewed and stable  Post vital signs: Reviewed and stable  Complications: No apparent anesthesia complications

## 2011-02-19 NOTE — Anesthesia Procedure Notes (Addendum)
Spinal Block  Patient location during procedure: OR Start time: 02/19/2011 7:48 AM Staffing CRNA/Resident: Marylene Buerger Performed by: resident/CRNA  Preanesthetic Checklist Completed: patient identified, site marked, surgical consent, pre-op evaluation, timeout performed, IV checked, risks and benefits discussed and monitors and equipment checked  Spinal Block  Start time: 02/19/2011 7:58 AM Spinal Block Patient position: right lateral decubitus Prep: Betadine Patient monitoring: heart rate Approach: midline Location: L3-4 Injection technique: single-shot Needle Needle type: Sprotte  Needle gauge: 24 G Needle length: 10 cm  Spinal Block  Additional Notes [id number] 2012  - 10 Lidicaine 75 Mg Fentanyl  25 Mcg

## 2011-02-19 NOTE — Brief Op Note (Signed)
02/19/2011  9:00 AM  PATIENT:  Katie Ford  77 y.o. female  PRE-OPERATIVE DIAGNOSIS:  tear lateral meniscus right knee  POST-OPERATIVE DIAGNOSIS:  tear lateral meniscus right knee degenerative arthritis  PROCEDURE:  Procedure(s): ARTHROSCOPY KNEE  SURGEON:  Surgeon(s): [employer]  PHYSICIAN ASSISTANT:   ASSISTANTS: none   ANESTHESIA:   spinal  ESTIMATED BLOOD LOSS: * No blood loss amount entered *   BLOOD ADMINISTERED:none  DRAINS: none   LOCAL MEDICATIONS USED:  MARCAINE 25CC  SPECIMEN:  Source of Specimen:  meniscus shavings  DISPOSITION OF SPECIMEN:  PATHOLOGY  COUNTS:  YES  TOURNIQUET:   Total Tourniquet Time Documented: Thigh (Right) - 36 minutes  DICTATION #: [id number]   PLAN OF CARE: To PACU then Discharge to observation bed for overnight stay here in the hospital and monitored bed.   PATIENT DISPOSITION:  PACU - hemodynamically stable.   Delay start of Pharmacological VTE agent (>24hrs) due to surgical blood loss or risk of bleeding:  no

## 2011-02-19 NOTE — Preoperative (Signed)
Beta Blockers   Reason not to administer Beta Blockers:Took Metoplolol at 6AM today. NO AnTIBIOTIC ORDERED

## 2011-02-19 NOTE — Addendum Note (Signed)
Addendum  created 02/19/11 1032 by Marylene Buerger, CRNA   Modules edited:Charges VN

## 2011-02-19 NOTE — Op Note (Signed)
NAMEKIKUYE, KORENEK             ACCOUNT NO.:  [id number]  MEDICAL RECORD NO.:  [id number]  LOCATION:  [facility]                          FACILITY:  [facility]  PHYSICIAN:  J. Darreld Mclean, M.D. DATE OF BIRTH:  Oct 01, 1931  DATE OF PROCEDURE: DATE OF DISCHARGE:                              OPERATIVE REPORT   PREOPERATIVE DIAGNOSIS:  Severe degenerative joint disease of the right knee plus tear of the lateral meniscus, status post partial medial meniscectomy.  POSTOPERATIVE DIAGNOSIS:  Severe degenerative joint disease of the right knee plus tear of the lateral meniscus, status post partial medial meniscectomy plus small remnant tear of the medial meniscus.  PROCEDURE PERFORMED:  Operative arthroscopy of the right knee, partial medial and lateral meniscectomy, and debridement of articular surface.  ANESTHESIA:  Spinal.  SURGEON:  J. Darreld Mclean, M.D.  No drains.  No blood given.  INDICATIONS:  The patient is a 77 year old female with pain and tenderness on her right knee with giving way, swelling, marked pain and tenderness not improved with conservative treatment.  She previously had an arthroscopy of the knee in 2003 by me and did well on the medial side and actually more pain laterally.  An MRI was done of the knee shows a tear of the right knee lateral meniscus with status post medial meniscectomy with severe degenerative joint disease bilaterally and ACL chronic tear.  Surgery is recommended.  She was not improving.  The patient has severe heart disease and is on anticoagulation through her heart doctor and these were stopped, but her cardiologist would like her admitted overnight for observation tonight on a monitored bed.  This has been arranged and was started back on anticoagulation medicine tomorrow. She plans to go home tomorrow.  She is familiar with the procedure and have been had a knee surgery in the past.  DESCRIPTION OF PROCEDURE:  The patient was seen in the  holding area, the right knee was identified as the correct surgical site.  She placed a mark on the right knee.  I placed a mark on the right knee.  She was brought to the operating room, given spinal anesthesia, then placed supine.  Tourniquet leg holder placed, deflated right upper thigh.  She was prepped and draped in the usual manner.  Generalized time-out was done.  The operating crew knew each other.  All instrumentation was deemed to be working, properly identified the patient as Ms. Liberto, we are doing arthroscopy of the right knee.  Leg was elevated and wrapped circumferentially with an Esmarch bandage. Tourniquet inflated to 350 mmHg.  Esmarch bandage removed.  Inflow cannula inserted medially.  Lactated Ringer's instilled in knee by an infusion pump.  The patient then had arthroscope inserted laterally, the knee was systematically observed.  She had severe degenerative joint disease of the knee with marked changes in the knee with grade 3-4 changes around the patella, marked synovitis grade 4 changes around the medial femoral condyle and part of the medial tibial plateau with a small remnant tear of the medial meniscus and this was removed. Anterior cruciate was chronically torn.  The lateral side was difficult to see.  She had a significant tear over the anterior  portion of the meniscus which prevented.  We are trying to get into the knee well and because of her severe degenerative joint disease, it was very difficult to review.  So, we had to remove the meniscus first anteriorly and then working to the lateral and posterior portion.  She has severe degenerative joint disease on the lateral side of the knee, grade 3 changes to grade 4 changes.  The meniscus was stellate tear and diffusely torn.  This was removed after this taking time using meniscal punch, meniscal shaver.  Knee was systematically reexamined and no pathology found and no loose bodies found.  Wound was  then reapproximated using 3-0 nylon interrupted vertical mattress manner. Marcaine 0.25% 25 mL instilled into the knee around each portal.  Total tourniquet time was 35 minutes.  Tourniquet course was released.  She tolerated the procedure well.  Sterile dressing applied.  She will be admitted to observation tonight, she will go to the PACU.          ______________________________ J. Darreld Mclean, M.D.     JWK/MEDQ  D:  02/19/2011  T:  02/19/2011  Job:  [id number]

## 2011-02-19 NOTE — Anesthesia Preprocedure Evaluation (Addendum)
Anesthesia Evaluation  Name, MR# and DOB Patient awake  General Assessment Comment  History of Anesthesia Complications Negative for: history of anesthetic complications  Airway Mallampati: I  Neck ROM: Full    Dental  (+) Teeth Intact   Pulmonary  asthma  clear to auscultation    Cardiovascular hypertension, Pt. on medications and Pt. on home beta blockers + CAD + dysrhythmias + Valvular Problems/Murmurs MR Regular Normal   Neuro/Psych  GI/Hepatic/Renal (+)  GERD Medicated     Endo/Other   Abdominal   Musculoskeletal  Hematology   Peds  Reproductive/Obstetrics   Anesthesia Other Findings             Anesthesia Physical Anesthesia Plan  ASA: III  Anesthesia Plan: Spinal   Post-op Pain Management:    Induction:   Airway Management Planned: Nasal Cannula  Additional Equipment:   Intra-op Plan:   Post-operative Plan:   Informed Consent:   Plan Discussed with:   Anesthesia Plan Comments: (SAB w/ propofol sedation )        Anesthesia Quick Evaluation

## 2011-02-20 MED ORDER — TRAMADOL HCL 50 MG PO TABS: 50.0000 mg | ORAL_TABLET | ORAL | Status: AC | PRN

## 2011-02-20 MED ORDER — MUPIROCIN 2 % EX OINT: TOPICAL_OINTMENT | Freq: Two times a day (BID) | CUTANEOUS | Status: AC

## 2011-02-20 NOTE — Discharge Summary (Signed)
Katie Ford, Katie Ford             ACCOUNT NO.:  [id number]  MEDICAL RECORD NO.:  [id number]  LOCATION:                                 FACILITY:  PHYSICIAN:  J. Darreld Mclean, M.D. DATE OF BIRTH:  February 12, 1932  DATE OF ADMISSION: DATE OF DISCHARGE:  LH                              DISCHARGE SUMMARY   DISCHARGE DIAGNOSIS:  Significant degenerative joint disease of the right knee with tear of the lateral meniscus and partial tear of the medial meniscus.  PROCEDURE PERFORMED:  Arthroscopy of the right knee.  Partial meniscectomy.  Debridement  DISCHARGE STATUS:  Improved.  PROGNOSIS:  Good.  DISPOSITION:  Home.  The patient has physical therapy scheduled the day after discharge as an outpatient.  DISCHARGE MEDICATIONS:  Mupirocin 2% ointment has been prescribed and given above prescription, tramadol 50 mg one tablet every 4 hours for pain if necessary take 2 tablets every 4-6 hours if the pain is worse. She is to resume her Xanax, Norvasc, calcium,  Zyrtec, Arthrotec, Effient 5 mg, Flovent inhaler, digoxin, Synthroid, Livalo 4 mg tablets, Toprol XL, Mucinex, murine dry eyes, Nitrostat, Protonix, ProAir inhaler, Soy lecithin capsules, and Tylenol as needed.  She already has these medicine and will continue these at home.  I will plan to see her in my office in 10 days to 2 weeks.  The therapist is to send me copies of the notes on how she is progressing.  The patient was placed in observation after surgery which she tolerated well on the recommendations of her cardiologist.  She has significant mitral regurgitation and history of paroxysmal supraventricular tachycardia ejection fraction less 50%, history of hypertension, asthma, bronchitis, and GERD along with her coronary artery disease.  She has been monitored and has had no untoward events.  She has remained stable in the hospital.  No new labs were drawn.  Labs prior to surgery were normal.  Her vital signs are stable.   She is afebrile.  She did complain of pain.  She has a problem with multiple allergies to most all oral analgesics only what she can take is Ultram, but she has tried various formulations of various medications over the years, and she gets very sick on her stomach and throws up.  She cannot take codeine at all or any preparation related to codeine.  She had pain and was given IV morphine on several occasions.  She has taken the tramadol.  The patient knows the exercises to do and physical therapy as stated has been arranged.  She is to contact me if any difficulty.  She has a walker at home and will use this.  She has had a previous knee surgery several years ago and did well.                                           ______________________________ J. Darreld Mclean, M.D.     JWK/MEDQ  D:  02/20/2011  T:  02/20/2011  Job:  [id number]

## 2011-02-20 NOTE — Progress Notes (Signed)
Patient discharged.  IV removed with no s/s of infection.  Aftercare instructions gone over with the patient.  Follow appt made.  Patient ready to be discharged in the company of her daughter.  Encouraged to elevate leg and use use ice periodically for swelling.  Patient and daughter verbalized understanding.

## 2011-02-20 NOTE — Progress Notes (Signed)
  She is afebrile.  Neurovascular is intact.  She complains of pain in the right knee.  She had a rough night last night she says.  She is intolerant to most oral analgesic agents.  The Ultram helps but does not fully control her pain.  She did have some IV morphine over the last 20 hours.    She can move her right knee well.  She has a walker at home.  She knows what to do.  She has physical therapy scheduled for tomorrow as an outpatient.  I will discharge her from observation this am.  She is scheduled to be seen in the office in ten days to two weeks.  She understands wound care.  She is to call if any problems.

## 2011-02-27 ENCOUNTER — Encounter (HOSPITAL_COMMUNITY): Payer: Self-pay | Admitting: Orthopaedic Surgery

## 2011-04-11 LAB — URINALYSIS, ROUTINE W REFLEX MICROSCOPIC
Glucose, UA: NEGATIVE
Hgb urine dipstick: NEGATIVE
Ketones, ur: NEGATIVE
Protein, ur: NEGATIVE

## 2011-04-20 ENCOUNTER — Other Ambulatory Visit: Payer: Self-pay

## 2011-04-20 ENCOUNTER — Emergency Department (HOSPITAL_COMMUNITY): Payer: Medicare Other

## 2011-04-20 ENCOUNTER — Inpatient Hospital Stay (HOSPITAL_COMMUNITY)
Admission: EM | Admit: 2011-04-20 | Discharge: 2011-04-30 | DRG: 481 | Disposition: A | Discharge status: Skilled Nursing Facility | Payer: Medicare Other | Attending: Orthopaedic Surgery | Admitting: Orthopaedic Surgery

## 2011-04-20 ENCOUNTER — Encounter (HOSPITAL_COMMUNITY): Payer: Self-pay | Admitting: Emergency Medicine

## 2011-04-20 DIAGNOSIS — N390 Urinary tract infection, site not specified: Secondary | ICD-10-CM | POA: Diagnosis not present

## 2011-04-20 DIAGNOSIS — S72143A Displaced intertrochanteric fracture of unspecified femur, initial encounter for closed fracture: Principal | ICD-10-CM | POA: Diagnosis present

## 2011-04-20 DIAGNOSIS — R509 Fever, unspecified: Secondary | ICD-10-CM | POA: Diagnosis not present

## 2011-04-20 DIAGNOSIS — S72002A Fracture of unspecified part of neck of left femur, initial encounter for closed fracture: Secondary | ICD-10-CM

## 2011-04-20 DIAGNOSIS — D72829 Elevated white blood cell count, unspecified: Secondary | ICD-10-CM | POA: Diagnosis present

## 2011-04-20 DIAGNOSIS — E039 Hypothyroidism, unspecified: Secondary | ICD-10-CM | POA: Diagnosis present

## 2011-04-20 DIAGNOSIS — I10 Essential (primary) hypertension: Secondary | ICD-10-CM

## 2011-04-20 DIAGNOSIS — S2239XA Fracture of one rib, unspecified side, initial encounter for closed fracture: Secondary | ICD-10-CM | POA: Diagnosis present

## 2011-04-20 DIAGNOSIS — D62 Acute posthemorrhagic anemia: Secondary | ICD-10-CM | POA: Diagnosis not present

## 2011-04-20 DIAGNOSIS — Z9861 Coronary angioplasty status: Secondary | ICD-10-CM

## 2011-04-20 DIAGNOSIS — I2510 Atherosclerotic heart disease of native coronary artery without angina pectoris: Secondary | ICD-10-CM | POA: Diagnosis present

## 2011-04-20 DIAGNOSIS — E119 Type 2 diabetes mellitus without complications: Secondary | ICD-10-CM | POA: Diagnosis present

## 2011-04-20 DIAGNOSIS — S2232XA Fracture of one rib, left side, initial encounter for closed fracture: Secondary | ICD-10-CM | POA: Diagnosis present

## 2011-04-20 DIAGNOSIS — D649 Anemia, unspecified: Secondary | ICD-10-CM | POA: Diagnosis present

## 2011-04-20 DIAGNOSIS — W010XXA Fall on same level from slipping, tripping and stumbling without subsequent striking against object, initial encounter: Secondary | ICD-10-CM | POA: Diagnosis present

## 2011-04-20 DIAGNOSIS — J45909 Unspecified asthma, uncomplicated: Secondary | ICD-10-CM | POA: Diagnosis present

## 2011-04-20 DIAGNOSIS — E871 Hypo-osmolality and hyponatremia: Secondary | ICD-10-CM | POA: Diagnosis present

## 2011-04-20 HISTORY — DX: Hypothyroidism, unspecified: E03.9

## 2011-04-20 HISTORY — DX: Hyperglycemia, unspecified: R73.9

## 2011-04-20 HISTORY — DX: Fracture of one rib, left side, initial encounter for closed fracture: S22.32XA

## 2011-04-20 LAB — DIFFERENTIAL
Basophils Absolute: 0.1 10*3/uL (ref 0.0–0.1)
Basophils Relative: 0 % (ref 0–1)
Eosinophils Absolute: 0.7 10*3/uL (ref 0.0–0.7)
Monocytes Absolute: 1.3 10*3/uL — ABNORMAL HIGH (ref 0.1–1.0)
Monocytes Relative: 10 % (ref 3–12)
Neutro Abs: 9.3 10*3/uL — ABNORMAL HIGH (ref 1.7–7.7)
Neutrophils Relative %: 72 % (ref 43–77)

## 2011-04-20 LAB — CBC
MCH: 28.4 pg (ref 26.0–34.0)
MCHC: 32.2 g/dL (ref 30.0–36.0)
RDW: 13.3 % (ref 11.5–15.5)

## 2011-04-20 LAB — URINALYSIS, ROUTINE W REFLEX MICROSCOPIC
Ketones, ur: NEGATIVE mg/dL
Leukocytes, UA: NEGATIVE
Nitrite: NEGATIVE
Protein, ur: NEGATIVE mg/dL
Urobilinogen, UA: 0.2 mg/dL (ref 0.0–1.0)

## 2011-04-20 LAB — COMPREHENSIVE METABOLIC PANEL
AST: 28 U/L (ref 0–37)
Albumin: 3.5 g/dL (ref 3.5–5.2)
BUN: 20 mg/dL (ref 6–23)
Chloride: 99 mEq/L (ref 96–112)
Creatinine, Ser: 1.38 mg/dL — ABNORMAL HIGH (ref 0.50–1.10)
Potassium: 4.5 mEq/L (ref 3.5–5.1)
Total Bilirubin: 0.2 mg/dL — ABNORMAL LOW (ref 0.3–1.2)
Total Protein: 7.2 g/dL (ref 6.0–8.3)

## 2011-04-20 MED ORDER — GUAIFENESIN ER 600 MG PO TB12
1200.0000 mg | ORAL_TABLET | Freq: Every day | ORAL | Status: DC
  Administered 2011-04-21 – 2011-04-30 (×9): 1200 mg via ORAL
  Filled 2011-04-20 (×12): qty 2

## 2011-04-20 MED ORDER — ACETAMINOPHEN 650 MG RE SUPP: 650.0000 mg | Freq: Four times a day (QID) | RECTAL | Status: DC | PRN

## 2011-04-20 MED ORDER — ALPRAZOLAM 0.5 MG PO TABS
0.5000 mg | ORAL_TABLET | Freq: Every day | ORAL | Status: DC | PRN
  Administered 2011-04-20 – 2011-04-28 (×4): 0.5 mg via ORAL
  Filled 2011-04-20 (×4): qty 1

## 2011-04-20 MED ORDER — SENNA 8.6 MG PO TABS
2.0000 | ORAL_TABLET | Freq: Every day | ORAL | Status: DC | PRN
  Filled 2011-04-20: qty 2

## 2011-04-20 MED ORDER — DIGOXIN 250 MCG PO TABS
250.0000 ug | ORAL_TABLET | Freq: Every day | ORAL | Status: DC
  Administered 2011-04-21 – 2011-04-30 (×9): 250 ug via ORAL
  Filled 2011-04-20 (×9): qty 1

## 2011-04-20 MED ORDER — ALBUTEROL SULFATE HFA 108 (90 BASE) MCG/ACT IN AERS
2.0000 | INHALATION_SPRAY | Freq: Four times a day (QID) | RESPIRATORY_TRACT | Status: DC | PRN
  Administered 2011-04-21 (×2): 2 via RESPIRATORY_TRACT
  Filled 2011-04-20: qty 6.7

## 2011-04-20 MED ORDER — PANTOPRAZOLE SODIUM 40 MG PO TBEC
40.0000 mg | DELAYED_RELEASE_TABLET | Freq: Two times a day (BID) | ORAL | Status: DC
  Administered 2011-04-21 – 2011-04-30 (×18): 40 mg via ORAL
  Filled 2011-04-20 (×18): qty 1

## 2011-04-20 MED ORDER — NITROGLYCERIN 0.4 MG SL SUBL: 0.4000 mg | SUBLINGUAL_TABLET | SUBLINGUAL | Status: DC | PRN

## 2011-04-20 MED ORDER — CALCIUM CARBONATE-VITAMIN D 500-200 MG-UNIT PO TABS
1.0000 | ORAL_TABLET | Freq: Every day | ORAL | Status: DC
  Administered 2011-04-22: 14:00:00 via ORAL
  Administered 2011-04-23 – 2011-04-30 (×7): 1 via ORAL
  Filled 2011-04-20 (×14): qty 1

## 2011-04-20 MED ORDER — LORATADINE 10 MG PO TABS
10.0000 mg | ORAL_TABLET | Freq: Every day | ORAL | Status: DC
  Administered 2011-04-21 – 2011-04-30 (×9): 10 mg via ORAL
  Filled 2011-04-20 (×9): qty 1

## 2011-04-20 MED ORDER — POLYETHYLENE GLYCOL 3350 17 G PO PACK
17.0000 g | PACK | Freq: Every day | ORAL | Status: DC
  Administered 2011-04-21 – 2011-04-30 (×9): 17 g via ORAL
  Filled 2011-04-20 (×9): qty 1

## 2011-04-20 MED ORDER — POLYVINYL ALCOHOL-POVIDONE 5-6 MG/ML OP SOLN: 1.0000 [drp] | OPHTHALMIC | Status: DC

## 2011-04-20 MED ORDER — ENOXAPARIN SODIUM 40 MG/0.4ML ~~LOC~~ SOLN
40.0000 mg | SUBCUTANEOUS | Status: DC
  Administered 2011-04-21: 40 mg via SUBCUTANEOUS
  Filled 2011-04-20: qty 0.4

## 2011-04-20 MED ORDER — ACETAMINOPHEN 325 MG PO TABS
650.0000 mg | ORAL_TABLET | Freq: Four times a day (QID) | ORAL | Status: DC | PRN
  Administered 2011-04-21 – 2011-04-29 (×6): 650 mg via ORAL
  Filled 2011-04-20 (×9): qty 2

## 2011-04-20 MED ORDER — HYDROMORPHONE HCL 1 MG/ML IJ SOLN
0.5000 mg | INTRAMUSCULAR | Status: DC | PRN
  Administered 2011-04-20 – 2011-04-22 (×6): 0.5 mg via INTRAVENOUS
  Filled 2011-04-20 (×6): qty 1

## 2011-04-20 MED ORDER — ONDANSETRON HCL 4 MG/2ML IJ SOLN
4.0000 mg | Freq: Four times a day (QID) | INTRAMUSCULAR | Status: DC | PRN
  Administered 2011-04-21: 4 mg via INTRAVENOUS
  Filled 2011-04-20: qty 2

## 2011-04-20 MED ORDER — SODIUM CHLORIDE 0.9 % IV SOLN
INTRAVENOUS | Status: DC
  Administered 2011-04-20: 16:00:00 via INTRAVENOUS

## 2011-04-20 MED ORDER — ALUM & MAG HYDROXIDE-SIMETH 200-200-20 MG/5ML PO SUSP: 30.0000 mL | Freq: Four times a day (QID) | ORAL | Status: DC | PRN

## 2011-04-20 MED ORDER — SOYA LECITHIN 1200 MG PO CAPS
1.0000 | ORAL_CAPSULE | Freq: Every day | ORAL | Status: DC
  Filled 2011-04-20 (×3): qty 1

## 2011-04-20 MED ORDER — METOPROLOL SUCCINATE ER 50 MG PO TB24
50.0000 mg | ORAL_TABLET | ORAL | Status: DC
  Administered 2011-04-21: 50 mg via ORAL
  Filled 2011-04-20 (×3): qty 1

## 2011-04-20 MED ORDER — AMLODIPINE BESYLATE 5 MG PO TABS
10.0000 mg | ORAL_TABLET | Freq: Every day | ORAL | Status: DC
  Administered 2011-04-21 – 2011-04-30 (×9): 10 mg via ORAL
  Filled 2011-04-20 (×7): qty 2
  Filled 2011-04-20: qty 1
  Filled 2011-04-20: qty 2
  Filled 2011-04-20: qty 1

## 2011-04-20 MED ORDER — MORPHINE SULFATE 4 MG/ML IJ SOLN
4.0000 mg | Freq: Once | INTRAMUSCULAR | Status: AC
  Administered 2011-04-20: 4 mg via INTRAVENOUS
  Filled 2011-04-20: qty 1

## 2011-04-20 MED ORDER — LEVOTHYROXINE SODIUM 50 MCG PO TABS
50.0000 ug | ORAL_TABLET | Freq: Every day | ORAL | Status: DC
  Administered 2011-04-21 – 2011-04-30 (×9): 50 ug via ORAL
  Filled 2011-04-20 (×12): qty 1

## 2011-04-20 MED ORDER — PITAVASTATIN CALCIUM 4 MG PO TABS
1.0000 | ORAL_TABLET | Freq: Every day | ORAL | Status: DC
  Filled 2011-04-20 (×3): qty 1

## 2011-04-20 MED ORDER — CALCIUM-MAGNESIUM-ZINC 333-133-8.3 MG PO TABS
1.0000 | ORAL_TABLET | Freq: Every day | ORAL | Status: DC
  Administered 2011-04-22: 1 via ORAL
  Filled 2011-04-20 (×3): qty 1

## 2011-04-20 MED ORDER — SODIUM CHLORIDE 0.9 % IV SOLN
INTRAVENOUS | Status: DC
  Administered 2011-04-21: 1000 mL via INTRAVENOUS
  Administered 2011-04-21: 17:00:00 via INTRAVENOUS
  Administered 2011-04-21: 1000 mL via INTRAVENOUS
  Administered 2011-04-22 (×2): via INTRAVENOUS

## 2011-04-20 MED ORDER — BISACODYL 10 MG RE SUPP: 10.0000 mg | Freq: Every day | RECTAL | Status: DC | PRN

## 2011-04-20 MED ORDER — ONDANSETRON HCL 4 MG/2ML IJ SOLN
4.0000 mg | Freq: Once | INTRAMUSCULAR | Status: AC
  Administered 2011-04-20: 4 mg via INTRAVENOUS
  Filled 2011-04-20: qty 2

## 2011-04-20 MED ORDER — ONDANSETRON HCL 4 MG PO TABS
4.0000 mg | ORAL_TABLET | Freq: Four times a day (QID) | ORAL | Status: DC | PRN
  Administered 2011-04-24: 4 mg via ORAL

## 2011-04-20 MED ORDER — FLEET ENEMA 7-19 GM/118ML RE ENEM: 1.0000 | ENEMA | Freq: Every day | RECTAL | Status: DC | PRN

## 2011-04-20 MED ORDER — FLUTICASONE PROPIONATE HFA 44 MCG/ACT IN AERO
1.0000 | INHALATION_SPRAY | Freq: Two times a day (BID) | RESPIRATORY_TRACT | Status: DC
  Administered 2011-04-21 – 2011-04-30 (×19): 1 via RESPIRATORY_TRACT
  Filled 2011-04-20 (×2): qty 10.6

## 2011-04-20 NOTE — ED Notes (Signed)
Attempted to call report, nurse in with pt at present.

## 2011-04-20 NOTE — ED Notes (Signed)
Pt fell on sidewalk hitting L hip.

## 2011-04-20 NOTE — ED Provider Notes (Signed)
Scribed for Katie Melter, MD, the patient was seen in room APA19/APA19. This chart was scribed by [employer]. The patient's care started at 15:34  CSN: [id number] Arrival date & time: No admission date for patient encounter.  Chief Complaint  Patient presents with  . Fall   HPI Katie Ford is a 77 y.o. female who presents to the Emergency Department complaining of a fall. Patient was walking with her cane when she noticed spots in front of her eyes. She fall and bounced on her left hip. Patient complains of pain and difficulty bearing weight on left hip. She retains normal range of motion in her knees and can wiggle her toes. Patient was taken off scoop after screening of primary neuro-functions..    Past Medical History  Diagnosis Date  . Ejection fraction < 50% 12/2005    EF 65 %... normal... catheterization 02/2010.  . Mitral regurgitation     mild, echo, June, 2007  . Paroxysmal supraventricular tachycardia     infrequent episodes over the years..palpitations.  . Hypertension   . Asthmatic bronchitis   . Obesity   . CAD (coronary artery disease)     bare metal stents to 2 separate RCA lesions August, 2011, residual 60% LAD, medical therapy  /  plan was for effient for one month followed by Plavix,, but patient does not tolerate Plavix... therefore patient placed back on effient  . GERD (gastroesophageal reflux disease)   . Edema 03/29/2010    September, 2011  . Intolerance of drug     aspirin and plavix    Past Surgical History  Procedure Date  . [place]    [facility]  . Knee arthroscopy 11/2001    left knee  . Laparoscopic cholecystectomy 04/29/1996    [facility] hospital  . Tubal ligation 09/1970  . [place]  . [place]  . [facility] hysterectomy [place]    [facility]  . Cardiac catheterization 02/22/2010    2 stents, [facility]  . Knee arthroscopy 02/19/2011   Procedure: ARTHROSCOPY KNEE;  Surgeon: Darreld Mclean;  Location: [facility];  Service: Orthopedics;  Laterality: Right;  Medial and Lateral Partial Menisectomy    No family history on file.  History  Substance Use Topics  . Smoking status: Never Smoker   . Smokeless tobacco: Never Used  . Alcohol Use: Yes     rarely    OB History    Grav Para Term Preterm Abortions TAB SAB Ect Mult Living                  Review of Systems  All other systems reviewed and are negative.    Allergies  Advair hfa; Celecoxib; Codeine; Hydrocodone; and Tartrazine  Home Medications   Current Outpatient Rx  Name Route Sig Dispense Refill  . ACETAMINOPHEN 325 MG PO TABS Oral Take 325 mg by mouth every 6 (six) hours as needed. FOR PAIN     . ALBUTEROL SULFATE HFA 108 (90 BASE) MCG/ACT IN AERS Inhalation Inhale 2 puffs into the lungs every 6 (six) hours as needed. FOR SHORTNESS OF BREATH    . ALPRAZOLAM 0.5 MG PO TABS Oral Take 0.5 mg by mouth daily as needed. TAKE HALF TO ONE  A TABLET IF NEEDED FOR ANXIETY     . AMLODIPINE BESYLATE 10 MG PO TABS Oral Take 10 mg by mouth daily.      Marland Kitchen  CALCIUM CARBONATE-VITAMIN D 600-200 MG-UNIT PO TABS Oral Take 1 tablet by mouth daily. OTC      . CALCIUM-MAGNESIUM-ZINC 333-133-8.3 MG PO TABS Oral Take 1 tablet by mouth daily.     Marland Kitchen CETIRIZINE HCL 10 MG PO TABS Oral Take 5 mg by mouth 2 (two) times daily. OTC   Take half a tablet twice daily FOR HIVES RELIEF    . DICLOFENAC-MISOPROSTOL 75-0.2 MG PO TABS Oral Take 1 tablet by mouth 2 (two) times daily.      Marland Kitchen DIGOXIN 0.25 MG PO TABS Oral Take 250 mcg by mouth daily.      Marland Kitchen EFFIENT 5 MG PO TABS  TAKE ONE TABLET BY MOUTH ONE TIME DAILY 30 tablet 5  . FLUTICASONE PROPIONATE  HFA 44 MCG/ACT IN AERO Inhalation Inhale 1 puff into the lungs 2 (two) times daily.      . GUAIFENESIN 600 MG PO TB12 Oral Take 1,200 mg by mouth daily. OTC TAKE ONE TO TWO TABLETS IF NEEDED FOR CONGESTION     . LEVOTHYROXINE SODIUM 50 MCG PO TABS  Oral Take 50 mcg by mouth daily.      Marland Kitchen METOPROLOL SUCCINATE 50 MG PO TB24 Oral Take 50 mg by mouth every morning. &  2 by mouth in evening    . NITROGLYCERIN 0.4 MG SL SUBL Sublingual Place 0.4 mg under the tongue every 5 (five) minutes as needed.      Marland Kitchen PANTOPRAZOLE SODIUM 40 MG PO TBEC  TAKE ONE TABLET BY MOUTH TWICE DAILY 60 tablet 3  . PITAVASTATIN CALCIUM 4 MG PO TABS Oral Take 1 tablet by mouth daily.      Marland Kitchen POLYVINYL ALCOHOL-POVIDONE 5-6 MG/ML OP SOLN Both Eyes Place 1 drop into both eyes as directed.      Marland Kitchen SOYA LECITHIN 1200 MG PO CAPS Oral Take 1 tablet by mouth daily.        BP 153/65  Pulse 82  Temp(Src) 97.9 F (36.6 C) (Oral)  Resp 20  SpO2 97%  Physical Exam  Nursing note and vitals reviewed. Constitutional: She appears well-developed and well-nourished.       Awake, alert, nontoxic appearance.  HENT:  Head: Normocephalic and atraumatic.  Eyes: Conjunctivae are normal. Pupils are equal, round, and reactive to light. Right eye exhibits no discharge. Left eye exhibits no discharge.  Neck: Neck supple.  Pulmonary/Chest: Effort normal. She exhibits no tenderness.  Abdominal: Soft. There is no tenderness. There is no rebound.  Musculoskeletal: She exhibits no tenderness.       Tenderness in the greater trochanter of the left hip Limited range of motion in left hip Pain with movement  Neurological:       Mental status and motor strength appears baseline for patient and situation.  Skin: No rash noted. She is not diaphoretic.  Psychiatric: She has a normal mood and affect.    ED Course  Procedures  OTHER DATA REVIEWED: Nursing notes, vital signs, and past medical records reviewed.   Date: 04/20/2011  Rate: 76  Rhythm: normal sinus rhythm  QRS Axis: normal  Intervals: normal  ST/T Wave abnormalities: nonspecific ST changes  Conduction Disutrbances:none  Narrative Interpretation:   Old EKG Reviewed: no change   DIAGNOSTIC STUDIES: Oxygen Saturation is 97% on  room air, normal by my interpretation.    LABS / RADIOLOGY: Screening labs for preoperative consideration have been ordered. The patient is anticoagulated with Effient. Films indicate left hip intertrochanteric fracture and left rib 9. Fracture  Dg Ribs Unilateral W/chest Left  04/20/2011  *RADIOLOGY REPORT*  Clinical Data: Fall.  Left chest injury and rib pain.  LEFT RIBS AND CHEST - 3+ VIEW  Comparison: 05/10/2010  Findings: Angulation of the cortex is seen involving the left anterior 9th rib, suspicious for a nondisplaced rib fracture.  No other rib fractures are identified.  No evidence of pneumothorax or hemothorax.  Para pulmonary hyperinflation is seen, consistent with COPD.  Both lungs are clear.  Mild cardiomegaly stable.  Ectasia of the thoracic aorta and great vessels is stable.  IMPRESSION:  1.  Probable nondisplaced fracture of the left anterior 9th rib. 2.  Stable mild cardiomegaly and COPD.  No active cardiopulmonary disease.  Original Report Authenticated By: Danae Orleans, M.D.   Dg Hip Complete Left  04/20/2011  *RADIOLOGY REPORT*  Clinical Data: Fall.  Left hip pain.  LEFT HIP - COMPLETE 2+ VIEW  Comparison: None.  Findings: A intertrochanteric left hip fracture is seen.  No evidence of dislocation.  No acetabular pelvic fracture identified. The lower lumbar spine degenerative changes noted.  IMPRESSION: Intertrochanteric left hip fracture.  Per CMS PQRS reporting requirements (PQRS Measure 24): Given the patient's age of greater than 50 and the fracture site (hip, distal radius, or spine), the patient should be tested for osteoporosis using DXA, and the appropriate treatment considered based on the DXA results.  Original Report Authenticated By: Danae Orleans, M.D.      ED COURSE / COORDINATION OF CARE: 15:35 - EDP examined patient's hip and ordered a Hip x-ray. 16:50 - EDP rechecked with patient and family at bedside. Discussed x-ray result. EDP to consult Ortho for further  evaluation of hip fracture.  MDM: Hip fracture in need of surgical consultation and likely surgical repair. The patient anticoagulated. She will need a period of time off the Effient prior to surgery.  IMPRESSION: #1 hip fracture, left #2 rib fracture, left  PLAN:  Admitted to Floor medical the case was discussed with the admitting physician Hilda Lias The patient is to return the emergency department if there is any worsening of symptoms. I have reviewed the discharge instructions with the patient and family  CONDITION ON DISCHARGE: Stable  MEDICATIONS GIVEN IN THE E.D.  Medications  0.9 %  sodium chloride infusion (not administered)  ondansetron (ZOFRAN) injection 4 mg (not administered)  morphine 4 MG/ML injection 4 mg (not administered)    DISCHARGE MEDICATIONS: New Prescriptions   No medications on file    SCRIBE ATTESTATION: I personally performed the services described in this documentation, which was scribed in my presence. The recorded information has been reviewed and considered. Katie Melter, MD     Katie Melter, MD 04/20/11 1739

## 2011-04-20 NOTE — ED Notes (Signed)
Attempted to call report. Nurse unable to take report at present.

## 2011-04-21 ENCOUNTER — Encounter (HOSPITAL_COMMUNITY): Payer: Self-pay | Admitting: Orthopedic Surgery

## 2011-04-21 MED ORDER — ROSUVASTATIN CALCIUM 20 MG PO TABS
10.0000 mg | ORAL_TABLET | Freq: Every day | ORAL | Status: DC
  Administered 2011-04-21 – 2011-04-29 (×7): 10 mg via ORAL
  Filled 2011-04-21 (×9): qty 1

## 2011-04-21 MED ORDER — SODIUM CHLORIDE 0.9 % IJ SOLN
INTRAMUSCULAR | Status: AC
  Administered 2011-04-21: 08:00:00
  Filled 2011-04-21: qty 10

## 2011-04-21 MED ORDER — METOPROLOL SUCCINATE ER 50 MG PO TB24
50.0000 mg | ORAL_TABLET | Freq: Two times a day (BID) | ORAL | Status: DC
  Administered 2011-04-21 – 2011-04-30 (×17): 50 mg via ORAL
  Filled 2011-04-21 (×17): qty 1

## 2011-04-21 MED ORDER — CEFAZOLIN SODIUM-DEXTROSE 2-3 GM-% IV SOLR
2.0000 g | INTRAVENOUS | Status: DC
  Filled 2011-04-21: qty 50

## 2011-04-21 MED ORDER — BIOTENE DRY MOUTH MT LIQD
Freq: Two times a day (BID) | OROMUCOSAL | Status: DC
  Administered 2011-04-21: 1 via OROMUCOSAL
  Administered 2011-04-21 – 2011-04-23 (×5): via OROMUCOSAL
  Administered 2011-04-24: 1 via OROMUCOSAL
  Administered 2011-04-24: 08:00:00 via OROMUCOSAL
  Administered 2011-04-25: 1 via OROMUCOSAL
  Administered 2011-04-26 (×2): via OROMUCOSAL
  Administered 2011-04-27: 1 via OROMUCOSAL
  Administered 2011-04-27 – 2011-04-28 (×3): via OROMUCOSAL
  Administered 2011-04-29: 1 via OROMUCOSAL
  Administered 2011-04-29 – 2011-04-30 (×2): via OROMUCOSAL

## 2011-04-21 MED ORDER — ENOXAPARIN SODIUM 40 MG/0.4ML ~~LOC~~ SOLN: 40.0000 mg | Freq: Once | SUBCUTANEOUS | Status: DC

## 2011-04-21 NOTE — H&P (Signed)
[name] is an 77 y.o. female.   Chief Complaint: left hip pain  [id number] Katie Katie Ford is a 77 y.o. female who presents to the Emergency Department complaining of a fall. Patient was walking with her cane when she noticed spots in front of her eyes. She fell and bounced on her left hip. Patient complains of severe non radiating deeppain and difficulty bearing weight on left hip.   Past Medical History  Diagnosis Date  . Ejection fraction < 50% 12/2005    EF 65 %... normal... catheterization 02/2010.  . Mitral regurgitation     mild, echo, June, 2007  . Paroxysmal supraventricular tachycardia     infrequent episodes over the years..palpitations.  . Hypertension   . Asthmatic bronchitis   . Obesity   . CAD (coronary artery disease)     bare metal stents to 2 separate RCA lesions August, 2011, residual 60% LAD, medical therapy  /  plan was for effient for one month followed by Plavix,, but patient does not tolerate Plavix... therefore patient placed back on effient  . GERD (gastroesophageal reflux disease)   . Edema 03/29/2010    September, 2011  . Intolerance of drug     aspirin and plavix    Past Surgical History  Procedure Date  . Arthro[place]rosc[place] medial [place]    [facility]  . Knee arthroscopy 11/2001    left knee  . Laparoscopic cholecystectomy 04/29/1996    [facility] hospital  . Tubal ligation 09/1970  . [place] 12[place]    [place]  . Sp arthro thumb*r* 05/[place]    [place]  . [facility] abdominal hysterectomy 1983    [facility]  . Cardiac catheterization 02/22/2010    2 stents, [facility]  . Knee arthroscopy 02/19/2011    Procedure: ARTHROSCOPY KNEE;  Surgeon: Darreld Mclean;  Location: [facility];  Service: Orthopedics;  Laterality: Right;  Medial and Lateral Partial Menisectomy    Family History  Problem Relation Age of Onset  . Diabetes Mother   . Arthritis Mother   . Heart attack Father   . Stroke Father   . Emphysema Father   . Cataracts Father   .  Cancer Sister   . Heart disease Sister   . Arthritis Sister   . Arthritis Brother   . Allergies Brother    Social History:  reports that she has never smoked. She has never used smokeless tobacco. She reports that she drinks alcohol. She reports that she does not use illicit drugs.  Allergies:  Allergies  Allergen Reactions  . Advair Hfa Other (See Comments)    TACHYCARDIA   . Celecoxib Other (See Comments)    TACHYCARDIA  . Codeine Other (See Comments)    TACHYCARIDIA  . Hydrocodone Other (See Comments)    TACHYCARDIA  . Tartrazine Other (See Comments)    Causes Tachycardia    Medications Prior to Admission  Medication Dose Route Frequency Provider Last Rate Last Dose  . 0.9 %  sodium chloride infusion   Intravenous Continuous Fuller Canada, MD 100 mL/hr at 04/21/11 0219 1,000 mL at 04/21/11 0219  . acetaminophen (TYLENOL) tablet 650 mg  650 mg Oral Q6H PRN Fuller Canada, MD   650 mg at 04/21/11 0222   Or  . acetaminophen (TYLENOL) suppository 650 mg  650 mg Rectal Q6H PRN Fuller Canada, MD      . albuterol (PROVENTIL HFA;VENTOLIN HFA) inhaler 2 puff  2 puff Inhalation Q6H PRN Fuller Canada, MD  2 puff at 04/21/11 0512  . ALPRAZolam Prudy Feeler) tablet 0.5 mg  0.5 mg Oral Daily PRN Fuller Canada, MD   0.5 mg at 04/20/11 2342  . alum & mag hydroxide-simeth (MAALOX/MYLANTA) 200-200-20 MG/5ML suspension 30 mL  30 mL Oral Q6H PRN Fuller Canada, MD      . amLODipine (NORVASC) tablet 10 mg  10 mg Oral Daily Fuller Canada, MD      . antiseptic oral rinse (BIOTENE) solution   Mouth Rinse BID Gokul Krishnan      . bisacodyl (DULCOLAX) suppository 10 mg  10 mg Rectal Daily PRN Fuller Canada, MD      . Calcium Carbonate-Vitamin D 600-200 MG-UNIT TABS 1 tablet  1 tablet Oral Daily Fuller Canada, MD      . CALCIUM-MAGNESUIUM-ZINC 333-133-8.3 MG TABS 1 tablet  1 tablet Oral Daily Fuller Canada, MD      . digoxin (LANOXIN) tablet 250 mcg  250 mcg Oral Daily Fuller Canada, MD      . enoxaparin (LOVENOX) injection 40 mg  40 mg Subcutaneous Q24H Fuller Canada, MD      . fluticasone (FLOVENT HFA) 44 MCG/ACT inhaler 1 puff  1 puff Inhalation BID Fuller Canada, MD   1 puff at 04/21/11 0515  . guaiFENesin (MUCINEX) 12 hr tablet 1,200 mg  1,200 mg Oral Daily Fuller Canada, MD      . HYDROmorphone (DILAUDID) injection 0.5 mg  0.5 mg Intravenous Q2H PRN Fuller Canada, MD   0.5 mg at 04/21/11 0748  . levothyroxine (SYNTHROID, LEVOTHROID) tablet 50 mcg  50 mcg Oral Daily Fuller Canada, MD      . loratadine (CLARITIN) tablet 10 mg  10 mg Oral Daily Fuller Canada, MD      . metoprolol (TOPROL-XL) 24 hr tablet 50-100 mg  50-100 mg Oral BID Mady Gemma, PHARMD      . morphine 4 MG/ML injection 4 mg  4 mg Intravenous Once Flint Melter, MD   4 mg at 04/20/11 1602  . morphine 4 MG/ML injection 4 mg  4 mg Intravenous Once Flint Melter, MD   4 mg at 04/20/11 1820  . nitroGLYCERIN (NITROSTAT) SL tablet 0.4 mg  0.4 mg Sublingual Q5 min PRN Fuller Canada, MD      . ondansetron [facility]) injection 4 mg  4 mg Intravenous Once Flint Melter, MD   4 mg at 04/20/11 1601  . ondansetron (ZOFRAN) injection 4 mg  4 mg Intravenous Once Flint Melter, MD   4 mg at 04/20/11 1820  . ondansetron (ZOFRAN) tablet 4 mg  4 mg Oral Q6H PRN Fuller Canada, MD       Or  . ondansetron [facility]) injection 4 mg  4 mg Intravenous Q6H PRN Fuller Canada, MD   4 mg at 04/21/11 0752  . pantoprazole (PROTONIX) EC tablet 40 mg  40 mg Oral BID AC Fuller Canada, MD   40 mg at 04/21/11 0856  . polyethylene glycol (MIRALAX / GLYCOLAX) packet 17 g  17 g Oral Daily Fuller Canada, MD      . Polyvinyl Alcohol-Povidone 5-6 MG/ML SOLN 1 drop  1 drop Both Eyes UD Fuller Canada, MD      . rosuvastatin (CRESTOR) tablet 10 mg  10 mg Oral q1800 Mady Gemma, PHARMD      . senna (SENOKOT) tablet 17.2 mg  2 tablet Oral Daily PRN Fuller Canada, MD      . sodium chloride  0.9 % injection           .  sodium phosphate (FLEET) 7-19 GM/118ML enema 1 enema  1 enema Rectal Daily PRN Fuller Canada, MD      . DISCONTD: 0.9 %  sodium chloride infusion   Intravenous Continuous Flint Melter, MD 125 mL/hr at 04/20/11 1601    . DISCONTD: metoprolol (TOPROL-XL) 24 hr tablet 50 mg  50 mg Oral QAM Fuller Canada, MD   50 mg at 04/21/11 [phone]  . DISCONTD: Pitavastatin Calcium TABS 4 mg  1 tablet Oral Daily Fuller Canada, MD      . DISCONTD: Soya Lecithin 1200 MG CAPS 1 tablet  1 tablet Oral Daily Fuller Canada, MD       Medications Prior to Admission  Medication Sig Dispense Refill  . acetaminophen (TYLENOL) 325 MG tablet Take 325 mg by mouth every 6 (six) hours as needed. FOR PAIN       . albuterol (PROAIR HFA) 108 (90 BASE) MCG/ACT inhaler Inhale 2 puffs into the lungs every 6 (six) hours as needed. FOR SHORTNESS OF BREATH      . ALPRAZolam (XANAX) 0.5 MG tablet Take 0.5 mg by mouth daily as needed. TAKE HALF TO ONE  A TABLET IF NEEDED FOR ANXIETY       . amLODipine (NORVASC) 10 MG tablet Take 10 mg by mouth daily.        . Calcium Carbonate-Vitamin D (CALCIUM + D) 600-200 MG-UNIT TABS Take 1 tablet by mouth daily. OTC        . CALCIUM-MAGNESUIUM-ZINC 333-133-8.3 MG TABS Take 1 tablet by mouth daily.       . cetirizine (ZYRTEC) 10 MG tablet Take 5 mg by mouth 2 (two) times daily. OTC   Take half a tablet twice daily FOR HIVES RELIEF      . diclofenac-misoprostol (ARTHROTEC 75) 75-0.2 MG per tablet Take 1 tablet by mouth 2 (two) times daily.        . digoxin (LANOXIN) 0.25 MG tablet Take 250 mcg by mouth daily.        Marland Kitchen EFFIENT 5 MG TABS TAKE ONE TABLET BY MOUTH ONE TIME DAILY  30 tablet  5  . fluticasone (FLOVENT HFA) 44 MCG/ACT inhaler Inhale 1 puff into the lungs 2 (two) times daily.        Marland Kitchen guaiFENesin (MUCINEX) 600 MG 12 hr tablet Take 1,200 mg by mouth daily. OTC TAKE ONE TO TWO TABLETS IF NEEDED FOR CONGESTION       . levothyroxine (SYNTHROID,  LEVOTHROID) 50 MCG tablet Take 50 mcg by mouth daily.        . metoprolol (TOPROL-XL) 50 MG 24 hr tablet Take 50-100 mg by mouth 2 (two) times daily. 50mg  every morning and 100mg  every evening.       . pantoprazole (PROTONIX) 40 MG tablet TAKE ONE TABLET BY MOUTH TWICE DAILY  60 tablet  3  . Pitavastatin Calcium (LIVALO) 4 MG TABS Take 1 tablet by mouth daily.        . Polyvinyl Alcohol-Povidone (MURINE TEARS FOR DRY EYES) 5-6 MG/ML SOLN Place 1 drop into both eyes as directed.        Ermalinda Memos Lecithin 1200 MG CAPS Take 1 tablet by mouth daily.        . nitroGLYCERIN (NITROSTAT) 0.4 MG SL tablet Place 0.4 mg under the tongue every 5 (five) minutes as needed.          Results for orders placed during the hospital encounter of 04/20/11 (from the past 48 hour(s))  CBC  Status: Abnormal   Collection Time   04/20/11  5:20 PM      Component Value Range Comment   WBC 13.0 (*) 4.0 - 10.5 (K/uL)    RBC 4.44  3.87 - 5.11 (MIL/uL)    Hemoglobin 12.6  12.0 - 15.0 (g/dL)    HCT 40.1  02.7 - 25.3 (%)    MCV 88.1  78.0 - 100.0 (fL)    MCH 28.4  26.0 - 34.0 (pg)    MCHC 32.2  30.0 - 36.0 (g/dL)    RDW 66.4  40.3 - 47.4 (%)    Platelets 293  150 - 400 (K/uL)   DIFFERENTIAL     Status: Abnormal   Collection Time   04/20/11  5:20 PM      Component Value Range Comment   Neutrophils Relative 72  43 - 77 (%)    Neutro Abs 9.3 (*) 1.7 - 7.7 (K/uL)    Lymphocytes Relative 12  12 - 46 (%)    Lymphs Abs 1.5  0.7 - 4.0 (K/uL)    Monocytes Relative 10  3 - 12 (%)    Monocytes Absolute 1.3 (*) 0.1 - 1.0 (K/uL)    Eosinophils Relative 6 (*) 0 - 5 (%)    Eosinophils Absolute 0.7  0.0 - 0.7 (K/uL)    Basophils Relative 0  0 - 1 (%)    Basophils Absolute 0.1  0.0 - 0.1 (K/uL)   COMPREHENSIVE METABOLIC PANEL     Status: Abnormal   Collection Time   04/20/11  5:20 PM      Component Value Range Comment   Sodium 135  135 - 145 (mEq/L)    Potassium 4.5  3.5 - 5.1 (mEq/L)    Chloride 99  96 - 112 (mEq/L)    CO2  28  19 - 32 (mEq/L)    Glucose, Bld 144 (*) 70 - 99 (mg/dL)    BUN 20  6 - 23 (mg/dL)    Creatinine, Ser 2.59 (*) 0.50 - 1.10 (mg/dL)    Calcium 8.9  8.4 - 10.5 (mg/dL)    Total Protein 7.2  6.0 - 8.3 (g/dL)    Albumin 3.5  3.5 - 5.2 (g/dL)    AST 28  0 - 37 (U/L)    ALT 22  0 - 35 (U/L)    Alkaline Phosphatase 105  39 - 117 (U/L)    Total Bilirubin 0.2 (*) 0.3 - 1.2 (mg/dL)    GFR calc non Af Amer 37 (*) >60 (mL/min)    GFR calc Af Amer 45 (*) >60 (mL/min)   TYPE AND SCREEN     Status: Normal   Collection Time   04/20/11  5:20 PM      Component Value Range Comment   ABO/RH(D) O POS      Antibody Screen NEG      Sample Expiration 04/23/2011     URINALYSIS, ROUTINE W REFLEX MICROSCOPIC     Status: Abnormal   Collection Time   04/20/11  5:37 PM      Component Value Range Comment   Color, Urine STRAW (*) YELLOW     Appearance CLEAR  CLEAR     Specific Gravity, Urine 1.015  1.005 - 1.030     pH 7.0  5.0 - 8.0     Glucose, UA NEGATIVE  NEGATIVE (mg/dL)    Hgb urine dipstick NEGATIVE  NEGATIVE     Bilirubin Urine NEGATIVE  NEGATIVE     Ketones,  ur NEGATIVE  NEGATIVE (mg/dL)    Protein, ur NEGATIVE  NEGATIVE (mg/dL)    Urobilinogen, UA 0.2  0.0 - 1.0 (mg/dL)    Nitrite NEGATIVE  NEGATIVE     Leukocytes, UA NEGATIVE  NEGATIVE  MICROSCOPIC NOT DONE ON URINES WITH NEGATIVE PROTEIN, BLOOD, LEUKOCYTES, NITRITE, OR GLUCOSE <1000 mg/dL.   Dg Ribs Unilateral W/chest Left  04/20/2011  *RADIOLOGY REPORT*  Clinical Data: Fall.  Left chest injury and rib pain.  LEFT RIBS AND CHEST - 3+ VIEW  Comparison: 05/10/2010  Findings: Angulation of the cortex is seen involving the left anterior 9th rib, suspicious for a nondisplaced rib fracture.  No other rib fractures are identified.  No evidence of pneumothorax or hemothorax.  Para pulmonary hyperinflation is seen, consistent with COPD.  Both lungs are clear.  Mild cardiomegaly stable.  Ectasia of the thoracic aorta and great vessels is stable.   IMPRESSION:  1.  Probable nondisplaced fracture of the left anterior 9th rib. 2.  Stable mild cardiomegaly and COPD.  No active cardiopulmonary disease.  Original Report Authenticated By: Danae Orleans, M.D.   Dg Hip Complete Left  04/20/2011  *RADIOLOGY REPORT*  Clinical Data: Fall.  Left hip pain.  LEFT HIP - COMPLETE 2+ VIEW  Comparison: None.  Findings: A intertrochanteric left hip fracture is seen.  No evidence of dislocation.  No acetabular pelvic fracture identified. The lower lumbar spine degenerative changes noted.  IMPRESSION: Intertrochanteric left hip fracture.  Per CMS PQRS reporting requirements (PQRS Measure 24): Given the patient's age of greater than 50 and the fracture site (hip, distal radius, or spine), the patient should be tested for osteoporosis using DXA, and the appropriate treatment considered based on the DXA results.  Original Report Authenticated By: Danae Orleans, M.D.    ROS  Blood pressure 140/69, pulse 66, temperature 98.2 F (36.8 C), temperature source Oral, resp. rate 20, height 5\' 4"  (1.626 m), weight 72.8 kg (160 lb 7.9 oz), SpO2 93.00%. Physical Exam  Constitutional: She is oriented to person, place, and time. She appears well-developed and well-nourished. No distress.  HENT:  Head: Normocephalic and atraumatic.  Eyes: Conjunctivae and EOM are normal. Pupils are equal, round, and reactive to light.  Neck: Normal range of motion. Neck supple. No JVD present.  Cardiovascular: Normal rate and regular rhythm.   Respiratory: Effort normal.  GI: Soft.  Lymphadenopathy:    She has no cervical adenopathy.       Right cervical: No superficial cervical adenopathy present.      Left cervical: No superficial cervical adenopathy present.  Neurological: She is alert and oriented to person, place, and time. She has normal reflexes.  Skin: Skin is warm.  Psychiatric: She has a normal mood and affect.   Musculoskeletal: The upper extremities are without subluxation  atrophy or tremor and have normal alignment  The left lower extremity is in traction  The right lower extremity is well aligned without subluxation atrophy or tremor  Assessment/Plan The patient is on a medication called asking it. It is a blood thinner. This will have to wear off before she can have surgery. Dr. Hilda Lias is out of town but will be back tomorrow. We will tentatively schedule the surgery for tomorrow with the family and patient understanding that it will probably have to be postponed another day. We will schedule her for a dynamic hip screw/Richard screw/DHS screw left hip.  Diagnosis left intertrochanteric fracture.  Fuller Canada 04/21/2011, 10:29 AM

## 2011-04-21 NOTE — Progress Notes (Signed)
Pharmacist - Physician Communication  Concerning: P&T Medication Policy on Herbal Medications  Description:  The patient's order(s) for Soya Lecithin has/have been noted. This product(s) is classified as an "herbal" or natural product.  Due to a lack of definitive safety studies or FDA approval, nonstandard manufacturing practices, plus the potential risk of unknown drug-drug interactions while on inpatient medications, the Pharmacy and Therapeutics Committee does not permit the use of "herbal" or natural products of this type within [facility].  Recommendation:  The pharmacy department has rejected this order at this time.  Please reevaluate the patient's clinical condition at discharge and address if the "herbal" or natural product(s) should be resumed at that time.

## 2011-04-22 ENCOUNTER — Encounter (HOSPITAL_COMMUNITY): Payer: Self-pay | Admitting: Adult Health

## 2011-04-22 DIAGNOSIS — I2510 Atherosclerotic heart disease of native coronary artery without angina pectoris: Secondary | ICD-10-CM

## 2011-04-22 DIAGNOSIS — I10 Essential (primary) hypertension: Secondary | ICD-10-CM

## 2011-04-22 DIAGNOSIS — Z01810 Encounter for preprocedural cardiovascular examination: Secondary | ICD-10-CM

## 2011-04-22 LAB — DIFFERENTIAL
Basophils Relative: 0 % (ref 0–1)
Eosinophils Relative: 2 % (ref 0–5)
Monocytes Absolute: 2.4 10*3/uL — ABNORMAL HIGH (ref 0.1–1.0)
Monocytes Relative: 16 % — ABNORMAL HIGH (ref 3–12)
Neutro Abs: 11.3 10*3/uL — ABNORMAL HIGH (ref 1.7–7.7)

## 2011-04-22 LAB — ABO/RH: ABO/RH(D): O POS

## 2011-04-22 LAB — CBC
HCT: 37.3 % (ref 36.0–46.0)
Hemoglobin: 11.9 g/dL — ABNORMAL LOW (ref 12.0–15.0)
MCH: 28.5 pg (ref 26.0–34.0)
MCHC: 31.9 g/dL (ref 30.0–36.0)
MCV: 89.4 fL (ref 78.0–100.0)
RDW: 13.4 % (ref 11.5–15.5)

## 2011-04-22 LAB — URINE CULTURE: Colony Count: NO GROWTH

## 2011-04-22 LAB — SURGICAL PCR SCREEN: Staphylococcus aureus: NEGATIVE

## 2011-04-22 LAB — PREPARE RBC (CROSSMATCH)

## 2011-04-22 MED ORDER — ONDANSETRON HCL 4 MG/2ML IJ SOLN: 4.0000 mg | Freq: Four times a day (QID) | INTRAMUSCULAR | Status: DC | PRN

## 2011-04-22 MED ORDER — HYDROMORPHONE 0.3 MG/ML IV SOLN
INTRAVENOUS | Status: AC
  Filled 2011-04-22: qty 25

## 2011-04-22 MED ORDER — CEFAZOLIN SODIUM 1-5 GM-% IV SOLN
1.0000 g | Freq: Once | INTRAVENOUS | Status: DC
  Filled 2011-04-22 (×2): qty 50

## 2011-04-22 MED ORDER — POVIDONE-IODINE 10 % EX SOLN
Freq: Once | CUTANEOUS | Status: AC
  Administered 2011-04-22: 08:00:00 via TOPICAL
  Filled 2011-04-22: qty 118

## 2011-04-22 MED ORDER — NALOXONE HCL 0.4 MG/ML IJ SOLN: 0.4000 mg | INTRAMUSCULAR | Status: DC | PRN

## 2011-04-22 MED ORDER — ENOXAPARIN SODIUM 40 MG/0.4ML ~~LOC~~ SOLN
40.0000 mg | SUBCUTANEOUS | Status: AC
  Administered 2011-04-22 – 2011-04-24 (×3): 40 mg via SUBCUTANEOUS
  Filled 2011-04-22 (×3): qty 0.4

## 2011-04-22 MED ORDER — DIPHENHYDRAMINE HCL 12.5 MG/5ML PO ELIX
12.5000 mg | ORAL_SOLUTION | Freq: Four times a day (QID) | ORAL | Status: DC | PRN
Start: 2011-04-22 — End: 2011-04-30

## 2011-04-22 MED ORDER — HYDROMORPHONE 0.3 MG/ML IV SOLN
INTRAVENOUS | Status: DC
  Administered 2011-04-22: 12:00:00 via INTRAVENOUS
  Administered 2011-04-23: 0.999 mg via INTRAVENOUS
  Administered 2011-04-23: 12:00:00 via INTRAVENOUS

## 2011-04-22 MED ORDER — DIPHENHYDRAMINE HCL 50 MG/ML IJ SOLN: 12.5000 mg | Freq: Four times a day (QID) | INTRAMUSCULAR | Status: DC | PRN

## 2011-04-22 MED ORDER — SODIUM CHLORIDE 0.9 % IJ SOLN: 9.0000 mL | INTRAMUSCULAR | Status: DC | PRN

## 2011-04-22 MED ORDER — CALCIUM-MAGNESIUM-ZINC 333-133-8.3 MG PO TABS
1.0000 | ORAL_TABLET | Freq: Every day | ORAL | Status: DC
  Administered 2011-04-23 – 2011-04-30 (×7): 1 via ORAL

## 2011-04-22 NOTE — Progress Notes (Signed)
She is on Effient.  She will need to be off this medicine for at least five days.  I have scheduled her hip surgery for Thursday.  I have cancelled plans for today.  I appreciate cardiology seeing the patient so promptly.  I will have the patient on enoxaparin for today, Tuesday and Wednesday.  The family is aware.  She is stable.  She is afebrile.  Pain controlled.

## 2011-04-22 NOTE — Consult Note (Signed)
CARDIOLOGY CONSULT NOTE  Patient ID: AIMA MCWHIRT MRN: [id number] DOB/AGE: 1932-07-16 77 y.o.  Admit date: 04/20/2011 Referring Physician:Keeling Primary Physician: Lysbeth Galas Primary Cardiologist: Myrtis Ser Reason for Consultation: Pre-Operative Evaluation and anticoagulation guidelines on Effient therapy  HPI: Katie Ford is a 77 y/o patient of Dr. Myrtis Ser in [place], with a history of CAD, with BMS to RCA (X 2) in August of 2011, on Effient as she does not tolerate Plavix or ASA, mitral regurgitation, PSVT, hypertension, GERD, who fell in a parking lot after developing lightheadedness and suffered an intertrochanteric fracture of the left hip, which will require surgical repair.    She denies chest pain, has had issues with shortness of breath associated with asthma, diaphoresis. She was a little dizzy on day of fall, but is usually very active, swimming daily at the [facility].  She states that Plavix upsets her stomach and ASA made her heart race in the remote past, She is apprehensive about taking ASA again because of this.  She was last seen by Dr. Myrtis Ser 12/10/2010, and was doing well.   Review of systems complete and found to be negative unless listed above  Past Medical History  Diagnosis Date  . Ejection fraction < 50% 12/2005    EF 65 %... normal... catheterization 02/2010.  . Mitral regurgitation     mild, echo, June, 2007  . Paroxysmal supraventricular tachycardia     infrequent episodes over the years..palpitations.  . Hypertension   . Asthmatic bronchitis   . Obesity   . CAD (coronary artery disease)     bare metal stents to 2 separate RCA lesions August, 2011, residual 60% LAD, medical therapy  /  plan was for effient for one month followed by Plavix,, but patient does not tolerate Plavix... therefore patient placed back on effient  . GERD (gastroesophageal reflux disease)   . Edema 03/29/2010    September, 2011  . Intolerance of drug     aspirin and plavix    Family History  Problem  Relation Age of Onset  . Diabetes Mother   . Arthritis Mother   . Heart attack Father   . Stroke Father   . Emphysema Father   . Cataracts Father   . Cancer Sister   . Heart disease Sister   . Arthritis Sister   . Arthritis Brother   . Allergies Brother     History   Social History  . Marital Status: Widowed    Spouse Name: N/A    Number of Children: N/A  . Years of Education: N/A   Occupational History  . [employer]     Engineer, site   Social History Main Topics  . Smoking status: Never Smoker   . Smokeless tobacco: Never Used  . Alcohol Use: Yes     rarely  . Drug Use: No  . Sexually Active: No   Other Topics Concern  . Not on file   Social History Narrative   Retired Engineer, site    Past Surgical History  Procedure Date  . Knee arthroscopy 11/2001    left knee  . Laparoscopic cholecystectomy 04/29/1996    [facility] hospital  . Tubal ligation 09/1970  . [place] 12[place]    [place]  . [place] thumb[place]r* 05[place]  . Total abdominal hysterectomy 1983    [facility]  . Cardiac catheterization 02/22/2010    2 stents, [facility]  . Knee arthroscopy 02/19/2011    Procedure: ARTHROSCOPY KNEE;  Surgeon: Darreld Mclean;  Location: [facility];  Service: Orthopedics;  Laterality: Right;  Medial and Lateral Partial Menisectomy  . Arthroplasty w/ arthroscopy medial / lateral compartment knee     [facility], should read arthroscopy not arthroplasty     Prescriptions prior to admission  Medication Sig Dispense Refill  . acetaminophen (TYLENOL) 325 MG tablet Take 325 mg by mouth every 6 (six) hours as needed. FOR PAIN       . albuterol (PROAIR HFA) 108 (90 BASE) MCG/ACT inhaler Inhale 2 puffs into the lungs every 6 (six) hours as needed. FOR SHORTNESS OF BREATH      . ALPRAZolam (XANAX) 0.5 MG tablet Take 0.5 mg by mouth daily as needed. TAKE HALF TO ONE  A TABLET IF NEEDED FOR ANXIETY       . amLODipine (NORVASC) 10 MG tablet Take 10 mg by mouth daily.        . Calcium  Carbonate-Vitamin D (CALCIUM + D) 600-200 MG-UNIT TABS Take 1 tablet by mouth daily. OTC        . CALCIUM-MAGNESUIUM-ZINC 333-133-8.3 MG TABS Take 1 tablet by mouth daily.       . cetirizine (ZYRTEC) 10 MG tablet Take 5 mg by mouth 2 (two) times daily. OTC   Take half a tablet twice daily FOR HIVES RELIEF      . diclofenac-misoprostol (ARTHROTEC 75) 75-0.2 MG per tablet Take 1 tablet by mouth 2 (two) times daily.        . digoxin (LANOXIN) 0.25 MG tablet Take 250 mcg by mouth daily.        Marland Kitchen EFFIENT 5 MG TABS TAKE ONE TABLET BY MOUTH ONE TIME DAILY  30 tablet  5  . fluticasone (FLOVENT HFA) 44 MCG/ACT inhaler Inhale 1 puff into the lungs 2 (two) times daily.        Marland Kitchen guaiFENesin (MUCINEX) 600 MG 12 hr tablet Take 1,200 mg by mouth daily. OTC TAKE ONE TO TWO TABLETS IF NEEDED FOR CONGESTION       . levothyroxine (SYNTHROID, LEVOTHROID) 50 MCG tablet Take 50 mcg by mouth daily.        . metoprolol (TOPROL-XL) 50 MG 24 hr tablet Take 50-100 mg by mouth 2 (two) times daily. 50mg  every morning and 100mg  every evening.       . pantoprazole (PROTONIX) 40 MG tablet TAKE ONE TABLET BY MOUTH TWICE DAILY  60 tablet  3  . Pitavastatin Calcium (LIVALO) 4 MG TABS Take 1 tablet by mouth daily.        . Polyvinyl Alcohol-Povidone (MURINE TEARS FOR DRY EYES) 5-6 MG/ML SOLN Place 1 drop into both eyes as directed.        Ermalinda Memos Lecithin 1200 MG CAPS Take 1 tablet by mouth daily.        Marland Kitchen DISCONTD: metoprolol (TOPROL-XL) 50 MG 24 hr tablet Take 50 mg by mouth every morning. &  2 by mouth in evening      . nitroGLYCERIN (NITROSTAT) 0.4 MG SL tablet Place 0.4 mg under the tongue every 5 (five) minutes as needed.          Physical Exam: Blood pressure 152/71, pulse 81, temperature 99.9 F (37.7 C), temperature source Oral, resp. rate 18, height 5\' 4"  (1.626 m), weight 160 lb 7.9 oz (72.8 kg), SpO2 93.00%.Body mass index is 27.55 kg/(m^2).    General: Well developed, well nourished, mildy anxious; mildly   overweight Head: Eyes PERRLA, No xanthomas.   Normal cephalic and atramatic  Lungs:Clear inspiration with  expiratory wheezes Heart: HRRR S1 S2, without 2/6 systolic murmur at the apex pulses are 2+ & equal.            No carotid bruit. No JVD.  No abdominal bruits. No femoral bruits. Abdomen: Bowel sounds are positive, abdomen soft and non-tender without masses or                  Hernia's noted. Msk:  Back normal, normal gait.Diminished strength at present.  Extremities: No clubbing, cyanosis or edema.  DP +1, Outward rotation of left leg with bucks traction noted.  Neuro: Alert and oriented X 3. Psych:  Good affect, responds appropriately   Labs:   Lab Results  Component Value Date   WBC 15.2* 04/22/2011   HGB 11.9* 04/22/2011   HCT 37.3 04/22/2011   MCV 89.4 04/22/2011   PLT 262 04/22/2011     Lab 04/20/11 1720  NA 135  K 4.5  CL 99  CO2 28  BUN 20  CREATININE 1.38*  CALCIUM 8.9  PROT 7.2  BILITOT 0.2*  ALKPHOS 105  ALT 22  AST 28  GLUCOSE 144*   Lab Results  Component Value Date   CKTOTAL 44 05/11/2010   CKMB 1.0 05/11/2010   TROPONINI  Value: 0.01        NO INDICATION OF MYOCARDIAL INJURY. 05/11/2010   (04/20/11)  IMPRESSION: Findings: A intertrochanteric left hip fracture is seen. No evidence of dislocation. No acetabular pelvic fracture identified.The lower lumbar spine degenerative changes noted.  Cardiac Catheterization (August 2011)  CONCLUSIONS:   1. Successful percutaneous stenting of the proximal and distal  portions of the mid right coronary artery using a non-drug-eluting overlapping platforms.   2. Preserved overall LV systolic function.   3. Scattered disease of the left anterior descending artery with 40% proximally and 60% mid stenosis (proximal LAD has a calcified smooth 40% eccentric plaque).   The patient will be treated with prasugrel.  Notably, she has an  aspirin allergy.  She will need prasugrel for a minimum of 1 month.  It is relative  contraindication as her age of 63, but she has no other limiting factors.  The patient could be treated with  clopidogrel following this for the duration of 1 year therapy.   EKG: NSR rate of 76 bpm, with nonspecific T-wave abnormalities should provide adequate ASSESSMENT AND PLAN:  1.  Left Hip Fx on Effient:   D/C Effient and wait 5 days to perform surgery.  LMWH should provide adequate DVT prophylaxis.   She is more than one year out from non-ST segment elevation MI and bare-metal stent placement in August of 2011; accordingly, continuing treatment with a P2Y12 platelet inhibitor is not mandatory.   Would consider trial of ASA (81 mg) if she is willing to try this, as her previous adverse reaction sounds neither like an allergy or an intolerance.    2. CAD: She is asymptomatic concerning chest pain, and has been very active until the fall.  Should continue metoprolol perioperatively.  She is relatively low  risk to proceed with hip repair with scattered diseased of the LAD per cath in August of 2011.    3.  PSVT:  Ashby Dawes previous arrhythmia is unclear.  Treatment with digoxin and  beta blocker appears to be efficacious and will be continued for the present.  4. Asthmatic Bronchitis:  Does not appear symptomatic at the present time.  Signed: Bettey Mare. Lyman Bishop NP [facility] 04/22/2011, 9:23  AM ---------------------------------------------------- Cardiology Attending  Patient interviewed and examined. Discussed with Joni Reining, NP.  Above note annotated and modified based upon my findings.  Patient has been asymptomatic with respect to coronary disease. With the relatively low risk surgical procedure planned, the likelihood of acute cardiac event is acceptable. Significant blood loss would likely occur if surgery were to proceed immediately. A 5 day period off drug is recommended for elective procedures. Postoperatively, a trial of low-dose aspirin could be initiated. If  tolerated, it would not be necessary to restart a medication in Effient's class.  We will be happy to follow this very nice lady with you.  [place] Bing, MD

## 2011-04-23 DIAGNOSIS — Z01810 Encounter for preprocedural cardiovascular examination: Secondary | ICD-10-CM

## 2011-04-23 MED ORDER — SODIUM CHLORIDE 0.9 % IJ SOLN
INTRAMUSCULAR | Status: AC
  Administered 2011-04-23: 23:00:00
  Filled 2011-04-23: qty 10

## 2011-04-23 MED ORDER — HYDROMORPHONE HCL 2 MG PO TABS
1.0000 mg | ORAL_TABLET | ORAL | Status: DC | PRN
  Administered 2011-04-24 – 2011-04-26 (×5): 1 mg via ORAL
  Filled 2011-04-23 (×5): qty 1

## 2011-04-23 MED ORDER — SODIUM CHLORIDE 0.9 % IN NEBU
INHALATION_SOLUTION | RESPIRATORY_TRACT | Status: AC
  Filled 2011-04-23: qty 3

## 2011-04-23 MED ORDER — SODIUM CHLORIDE 0.9 % IN NEBU
INHALATION_SOLUTION | RESPIRATORY_TRACT | Status: AC
  Administered 2011-04-23: 3 mL
  Filled 2011-04-23: qty 3

## 2011-04-23 MED ORDER — HYDROMORPHONE 0.3 MG/ML IV SOLN
INTRAVENOUS | Status: AC
  Filled 2011-04-23: qty 25

## 2011-04-23 MED ORDER — ALBUTEROL SULFATE (5 MG/ML) 0.5% IN NEBU
2.5000 mg | INHALATION_SOLUTION | Freq: Four times a day (QID) | RESPIRATORY_TRACT | Status: DC
  Administered 2011-04-23 – 2011-04-25 (×7): 2.5 mg via RESPIRATORY_TRACT
  Filled 2011-04-23 (×8): qty 0.5

## 2011-04-23 NOTE — Progress Notes (Signed)
SUBJECTIVE: No cardiac complaints. Little confused  Filed Vitals:   04/23/11 0400 04/23/11 0525 04/23/11 0718 04/23/11 0800  BP:  130/82    Pulse:  81    Temp:  99.8 F (37.7 C)    TempSrc:  Oral    Resp: 20 20  18   Height:      Weight:      SpO2: 96% 91% 93% 98%    Intake/Output Summary (Last 24 hours) at 04/23/11 0938 Last data filed at 04/23/11 0525  Gross per 24 hour  Intake   4470 ml  Output   2550 ml  Net   1920 ml    LABS: Basic Metabolic Panel:  Basename 04/20/11 1720  NA 135  K 4.5  CL 99  CO2 28  GLUCOSE 144*  BUN 20  CREATININE 1.38*  CALCIUM 8.9  MG --  PHOS --   Liver Function Tests:  [facility] 04/20/11 1720  AST 28  ALT 22  ALKPHOS 105  BILITOT 0.2*  PROT 7.2  ALBUMIN 3.5   No results found for this basename: LIPASE:2,AMYLASE:2 in the last 72 hours CBC:  Basename 04/22/11 0749 04/20/11 1720  WBC 15.2* 13.0*  NEUTROABS 11.3* 9.3*  HGB 11.9* 12.6  HCT 37.3 39.1  MCV 89.4 88.1  PLT 262 293     RADIOLOGY: Dg Ribs Unilateral W/chest Left  04/20/2011  *RADIOLOGY REPORT*  Clinical Data: Fall.  Left chest injury and rib pain.  LEFT RIBS AND CHEST - 3+ VIEW  Comparison: 05/10/2010 IMPRESSION:  1.  Probable nondisplaced fracture of the left anterior 9th rib. 2.  Stable mild cardiomegaly and COPD.  No active cardiopulmonary disease.  Original Report Authenticated By: Danae Orleans, M.D.   Dg Hip Complete Left  04/20/2011  *RADIOLOGY REPORT*  Clinical Data: Fall.  Left hip pain.  LEFT HIP - COMPLETE 2+ VIEW IMPRESSION: Intertrochanteric left hip fracture.  Per CMS PQRS reporting requirements (PQRS Measure 24): Given the patient's age of greater than 50 and the fracture site (hip, distal radius, or spine), the patient should be tested for osteoporosis using DXA, and the appropriate treatment considered based on the DXA results.  Original Report Authenticated By: Danae Orleans, M.D.    PHYSICAL EXAM General: Well developed, well nourished, in no  acute distress Head: Eyes PERRLA, No xanthomas.   Normal cephalic and atramatic  Lungs: Clear bilaterally to auscultation and percussion. Heart: HRRR S1 S2,1/.6 systolic murmur.  Pulses are 2+ & equal.            No carotid bruit. No JVD.  No abdominal bruits. No femoral bruits. Abdomen: Bowel sounds are positive, abdomen soft and non-tender without masses or                  Hernia's noted. Msk:  Back normal, normal gait. Normal strength and tone for age. Extremities: No clubbing, cyanosis or edema.  DP +1 Neuro: Alert and oriented X 3. Psych:  Good affect, responds appropriately  TELEMETRY: Reviewed telemetry pt in NSR  ASSESSMENT AND PLAN:  ASSESSMENT AND PLAN:  1. Left Hip Fx on Effient: D/C Effient and wait 5 days to perform surgery. LMWH should provide adequate DVT prophylaxis.Consider placing her back on low dose ASA post-operatively. She is a little apprehensive of this as she did not tolerate this many years ago (made her heart race). Should re-try at low dose.  2. CAD: She is asymptomatic concerning chest pain, and has been very active until the fall. Should  continue metoprolol perioperatively. She is relatively low risk to proceed with hip repair with scattered diseased of the LAD per cath in August of 2011. Will follow from a distance.  Patient examined and chart reviewed.  Continue beta blocker.  SEM on exam.  For surgery on Thursday.  Charlton Haws   Active Problems:  * No active hospital problems. Bettey Mare. Lyman Bishop NP [facility]

## 2011-04-23 NOTE — Progress Notes (Signed)
Subjective: I am just waiting for the surgery.   Objective: Vital signs in last 24 hours: Temp:  [98.5 F (36.9 C)-99.8 F (37.7 C)] 99.8 F (37.7 C) (10/02 0525) Pulse Rate:  [81-93] 81  (10/02 0525) Resp:  [18-20] 18  (10/02 0800) BP: (130-161)/(73-82) 130/82 mmHg (10/02 0525) SpO2:  [91 %-98 %] 98 % (10/02 0800)  Intake/Output from previous day: 10/01 0701 - 10/02 0700 In: 4470 [P.O.:200; I.V.:4270] Out: 2850 [Urine:2850] Intake/Output this shift:     Basename 04/22/11 0749 04/20/11 1720  HGB 11.9* 12.6    Basename 04/22/11 0749 04/20/11 1720  WBC 15.2* 13.0*  RBC 4.17 4.44  HCT 37.3 39.1  PLT 262 293    Basename 04/20/11 1720  NA 135  K 4.5  CL 99  CO2 28  BUN 20  CREATININE 1.38*  GLUCOSE 144*  CALCIUM 8.9    Basename 04/22/11 0749 04/21/11 1101  LABPT -- --  INR 1.05 1.04    Neurovascular intact Sensation intact distally Intact pulses distally She has no major complaints.  Her pain is controlled.  Assessment/Plan: Surgery is planned for Thursday of this week, in two more days.  I will have hospitalist be consulted as a base line.  Dr. Lysbeth Galas is her family doctor.    I will order Respiratory Therapy.   Katie Ford 04/23/2011, 8:49 AM

## 2011-04-24 ENCOUNTER — Inpatient Hospital Stay (HOSPITAL_COMMUNITY): Payer: Medicare Other

## 2011-04-24 ENCOUNTER — Encounter (HOSPITAL_COMMUNITY): Payer: Medicare Other

## 2011-04-24 ENCOUNTER — Other Ambulatory Visit: Payer: Self-pay

## 2011-04-24 ENCOUNTER — Encounter (HOSPITAL_COMMUNITY): Payer: Self-pay | Admitting: Internal Medicine

## 2011-04-24 DIAGNOSIS — J45909 Unspecified asthma, uncomplicated: Secondary | ICD-10-CM | POA: Diagnosis present

## 2011-04-24 DIAGNOSIS — D72829 Elevated white blood cell count, unspecified: Secondary | ICD-10-CM | POA: Diagnosis present

## 2011-04-24 DIAGNOSIS — S72002A Fracture of unspecified part of neck of left femur, initial encounter for closed fracture: Secondary | ICD-10-CM | POA: Diagnosis present

## 2011-04-24 DIAGNOSIS — R509 Fever, unspecified: Secondary | ICD-10-CM | POA: Diagnosis not present

## 2011-04-24 DIAGNOSIS — E039 Hypothyroidism, unspecified: Secondary | ICD-10-CM

## 2011-04-24 DIAGNOSIS — S2232XA Fracture of one rib, left side, initial encounter for closed fracture: Secondary | ICD-10-CM | POA: Diagnosis present

## 2011-04-24 DIAGNOSIS — E871 Hypo-osmolality and hyponatremia: Secondary | ICD-10-CM | POA: Diagnosis present

## 2011-04-24 DIAGNOSIS — E119 Type 2 diabetes mellitus without complications: Secondary | ICD-10-CM | POA: Diagnosis present

## 2011-04-24 DIAGNOSIS — R739 Hyperglycemia, unspecified: Secondary | ICD-10-CM

## 2011-04-24 DIAGNOSIS — D649 Anemia, unspecified: Secondary | ICD-10-CM | POA: Diagnosis present

## 2011-04-24 HISTORY — DX: Fracture of one rib, left side, initial encounter for closed fracture: S22.32XA

## 2011-04-24 HISTORY — DX: Hyperglycemia, unspecified: R73.9

## 2011-04-24 HISTORY — DX: Hypothyroidism, unspecified: E03.9

## 2011-04-24 LAB — BASIC METABOLIC PANEL
BUN: 9 mg/dL (ref 6–23)
CO2: 28 mEq/L (ref 19–32)
Chloride: 97 mEq/L (ref 96–112)
Creatinine, Ser: 0.67 mg/dL (ref 0.50–1.10)
GFR calc Af Amer: >90 mL/min (ref >90)
Glucose, Bld: 189 mg/dL — ABNORMAL HIGH (ref 70–99)

## 2011-04-24 LAB — TYPE AND SCREEN
ABO/RH(D): O POS
Antibody Screen: NEGATIVE
Unit division: 0
Unit division: 0

## 2011-04-24 LAB — CBC
HCT: 34.8 % — ABNORMAL LOW (ref 36.0–46.0)
MCV: 87.7 fL (ref 78.0–100.0)
RDW: 13.4 % (ref 11.5–15.5)
WBC: 14.6 10*3/uL — ABNORMAL HIGH (ref 4.0–10.5)

## 2011-04-24 LAB — T4, FREE: Free T4: 1.36 ng/dL (ref 0.80–1.80)

## 2011-04-24 LAB — URINALYSIS, ROUTINE W REFLEX MICROSCOPIC
Bilirubin Urine: NEGATIVE
Ketones, ur: NEGATIVE mg/dL
Nitrite: POSITIVE — AB
Urobilinogen, UA: 2 mg/dL — ABNORMAL HIGH (ref 0.0–1.0)

## 2011-04-24 LAB — URINE MICROSCOPIC-ADD ON

## 2011-04-24 MED ORDER — CEFAZOLIN SODIUM 1-5 GM-% IV SOLN
1.0000 g | Freq: Once | INTRAVENOUS | Status: DC
  Filled 2011-04-24: qty 50

## 2011-04-24 MED ORDER — LEVOFLOXACIN IN D5W 750 MG/150ML IV SOLN
750.0000 mg | INTRAVENOUS | Status: DC
  Administered 2011-04-24: 750 mg via INTRAVENOUS
  Filled 2011-04-24 (×3): qty 150

## 2011-04-24 MED ORDER — POVIDONE-IODINE 10 % EX SOLN
Freq: Once | CUTANEOUS | Status: DC
  Filled 2011-04-24: qty 118

## 2011-04-24 MED ORDER — SODIUM CHLORIDE 0.9 % IJ SOLN
10.0000 mL | INTRAMUSCULAR | Status: DC | PRN
  Filled 2011-04-24 (×3): qty 10

## 2011-04-24 MED ORDER — HYDROMORPHONE 0.3 MG/ML IV SOLN
INTRAVENOUS | Status: DC
  Administered 2011-04-24: 1.79 mg via INTRAVENOUS
  Administered 2011-04-25: 25 mL via INTRAVENOUS
  Administered 2011-04-25: 2.79 mg via INTRAVENOUS
  Administered 2011-04-25: 0.599 mg via INTRAVENOUS
  Administered 2011-04-25: 1.59 mg via INTRAVENOUS
  Administered 2011-04-26: 1.19 mg via INTRAVENOUS
  Administered 2011-04-26: 0.3 mg via INTRAVENOUS
  Administered 2011-04-26: 0.4 mg via INTRAVENOUS
  Administered 2011-04-26: 22:00:00 via INTRAVENOUS
  Administered 2011-04-27: 0.9 mg via INTRAVENOUS
  Administered 2011-04-27: 3 mg via INTRAVENOUS
  Administered 2011-04-27 (×2): 0.6 mg via INTRAVENOUS
  Administered 2011-04-27: 4 mg via INTRAVENOUS
  Administered 2011-04-28: 2.7 mg via INTRAVENOUS
  Administered 2011-04-28: 1 mg via INTRAVENOUS
  Administered 2011-04-28: 1.5 mg via INTRAVENOUS
  Administered 2011-04-28: 0.6 mg via INTRAVENOUS
  Administered 2011-04-28: 2.7 mg via INTRAVENOUS
  Administered 2011-04-28: 1.8 mg via INTRAVENOUS
  Administered 2011-04-29: 3.3 mg via INTRAVENOUS
  Administered 2011-04-29: 0.3 mg via INTRAVENOUS
  Administered 2011-04-29: 08:00:00 via INTRAVENOUS
  Administered 2011-04-29 – 2011-04-30 (×3): 0 mg via INTRAVENOUS
  Administered 2011-04-30: 0.9 mg via INTRAVENOUS

## 2011-04-24 MED ORDER — SODIUM CHLORIDE 0.9 % IJ SOLN
10.0000 mL | Freq: Two times a day (BID) | INTRAMUSCULAR | Status: DC
  Administered 2011-04-25 – 2011-04-28 (×3): 10 mL

## 2011-04-24 NOTE — Consult Note (Addendum)
Katie Ford MRN: [id number] DOB/AGE: 12-05-1931 77 y.o. Primary Care Physician:Ford,Katie Katie Maduro, MD Consult date: 04/24/11. Referring Physician: Darreld Mclean, MD Reason for Consult: Fever and medical management.  HPI: The patient is a 77 year old woman who has a history of coronary artery disease, PSVT, hypothyroidism, and hypertension. She was admitted by Dr. Romeo Ford on 04/21/2011 after falling on her left hip, causing a fracture. Per the history she provides, she was walking with her cane, getting ready to go up some steps, felt lightheaded, lost her balance, and fell on her left side. She felt immediate pain in her left hip. She denied any preceding chest pain, vertigo, shortness of breath, or palpitations. She did not lose consciousness. There was no head trauma. Currently, she is in no acute distress. She has been evaluated by the cardiology team from [facility], and she was cleared for surgery by them. Apparently, the operation has been postponed until tomorrow because the patient needs to be off of Effient for 5 days.  The patient became febrile overnight with a temperature of 101.7. She is currently afebrile with a temperature of 98.9. She has no complaints of a cough, diarrhea, or painful urination.    Pa thatst Medical History  Diagnosis Date  . Ejection fraction < 50% 12/2005    EF 65 %... normal... catheterization 02/2010.  . Mitral regurgitation     mild, echo, June, 2007  . Paroxysmal supraventricular tachycardia     infrequent episodes over the years..palpitations.  . Hypertension   . Asthmatic bronchitis   . Obesity   . CAD (coronary artery disease)     bare metal stents to 2 separate RCA lesions August, 2011, residual 60% LAD, medical therapy  /  plan was for effient for one month followed by Plavix,, but patient does not tolerate Plavix... therefore patient placed back on effient  . GERD (gastroesophageal reflux disease)   . Edema 03/29/2010    September, 2011  .  Intolerance of drug     aspirin and plavix  . Hypothyroidism 04/24/2011  .  04/24/2011    Past Surgical History  Procedure Date  . Knee arthroscopy 11/2001    left knee  . Laparoscopic cholecystectomy 04/29/1996    [facility]  . Tubal ligation 09/1970  . [place] fracture [place] 12[place]    [place]  . [place] thumb*r* 05/1989    [place]  . Total abdominal hysterectomy 1983    [facility]  . Cardiac catheterization 02/22/2010    2 stents, [facility]  . Knee arthroscopy 02/19/2011    Procedure: ARTHROSCOPY KNEE;  Surgeon: Katie Ford;  Location: [facility];  Service: Orthopedics;  Laterality: Right;  Medial and Lateral Partial Menisectomy  . Arthroplasty w/ arthroscopy medial / lateral compartment knee     [facility], should read arthroscopy not arthroplasty    Prior to Admission medications   Medication Sig Start Date End Date Taking? Authorizing Provider  acetaminophen (TYLENOL) 325 MG tablet Take 325 mg by mouth every 6 (six) hours as needed. FOR PAIN    Yes Historical Provider, MD  albuterol (PROAIR HFA) 108 (90 BASE) MCG/ACT inhaler Inhale 2 puffs into the lungs every 6 (six) hours as needed. FOR SHORTNESS OF BREATH   Yes Historical Provider, MD  ALPRAZolam Prudy Feeler) 0.5 MG tablet Take 0.5 mg by mouth daily as needed. TAKE HALF TO ONE  A TABLET IF NEEDED FOR ANXIETY    Yes Historical Provider, MD  amLODipine (NORVASC) 10 MG tablet Take 10 mg by mouth daily.  Yes Historical Provider, MD  Calcium Carbonate-Vitamin D (CALCIUM + D) 600-200 MG-UNIT TABS Take 1 tablet by mouth daily. OTC     Yes Historical Provider, MD  CALCIUM-MAGNESUIUM-ZINC 333-133-8.3 MG TABS Take 1 tablet by mouth daily.    Yes Historical Provider, MD  cetirizine (ZYRTEC) 10 MG tablet Take 5 mg by mouth 2 (two) times daily. OTC   Take half a tablet twice daily FOR HIVES RELIEF   Yes Historical Provider, MD  diclofenac-misoprostol (ARTHROTEC 75) 75-0.2 MG per tablet Take 1 tablet by mouth 2 (two) times daily.     Yes Historical Provider, MD    digoxin (LANOXIN) 0.25 MG tablet Take 250 mcg by mouth daily.     Yes Historical Provider, MD  EFFIENT 5 MG TABS TAKE ONE TABLET BY MOUTH ONE TIME DAILY 01/03/11  Yes Katie Abed, MD  fluticasone (FLOVENT HFA) 44 MCG/ACT inhaler Inhale 1 puff into the lungs 2 (two) times daily.     Yes Historical Provider, MD  guaiFENesin (MUCINEX) 600 MG 12 hr tablet Take 1,200 mg by mouth daily. OTC TAKE ONE TO TWO TABLETS IF NEEDED FOR CONGESTION    Yes Historical Provider, MD  levothyroxine (SYNTHROID, LEVOTHROID) 50 MCG tablet Take 50 mcg by mouth daily.     Yes Historical Provider, MD  metoprolol (TOPROL-XL) 50 MG 24 hr tablet Take 50-100 mg by mouth 2 (two) times daily. 50mg  every morning and 100mg  every evening.    Yes Historical Provider, MD  pantoprazole (PROTONIX) 40 MG tablet TAKE ONE TABLET BY MOUTH TWICE DAILY 01/15/11  Yes Katie Abed, MD  Pitavastatin Calcium (LIVALO) 4 MG TABS Take 1 tablet by mouth daily.     Yes Historical Provider, MD  Polyvinyl Alcohol-Povidone (MURINE TEARS FOR DRY EYES) 5-6 MG/ML SOLN Place 1 drop into both eyes as directed.     Yes Historical Provider, MD  Soya Lecithin 1200 MG CAPS Take 1 tablet by mouth daily.     Yes Historical Provider, MD  nitroGLYCERIN (NITROSTAT) 0.4 MG SL tablet Place 0.4 mg under the tongue every 5 (five) minutes as needed.      Historical Provider, MD    Allergies:  Allergies  Allergen Reactions  . Advair Hfa Other (See Comments)    TACHYCARDIA   . Celecoxib Other (See Comments)    TACHYCARDIA  . Codeine Other (See Comments)    TACHYCARIDIA  . Hydrocodone Other (See Comments)    TACHYCARDIA  . Tartrazine Other (See Comments)    Causes Tachycardia    Family History  Problem Relation Age of Onset  . Diabetes Mother   . Arthritis Mother   . Heart attack Father   . Stroke Father   . Emphysema Father   . Cataracts Father   . Cancer Sister   . Heart disease Sister   . Arthritis Sister   . Arthritis Brother   . Allergies  Brother     Social History: She is widowed. She lives in [place]. She is retired from the [employer]. She has 3 children. She had been driving without any difficulty. She denies alcohol, tobacco, and illicit drug use.     ROS: As above in history of present illness. In addition, she does have arthritic pain in her knees occasionally.   PHYSICAL EXAM: Blood pressure 135/66, pulse 78, temperature 98.9 F (37.2 C), temperature source Oral, resp. rate 19, height 5\' 4"  (1.626 m), weight 72.8 kg (160 lb 7.9 oz), SpO2 97.00%.  General:  The patient is lying in bed, in no acute distress. HEENT: Head is normocephalic nontraumatic. Pupils are equal, round, and reactive to light. Extraocular limits are intact. Conjunctivae are clear and sclerae are white. Oropharynx reveals mildly dry mucous membranes. No posterior exudates or erythema. Neck: Supple, no adenopathy, no JVD, no bruit. Heart: S1, S2, with a 2/6 systolic murmur. Abdomen: Mildly obese, positive bowel sounds, nontender, nondistended. Extremities: Trace of pedal edema on the left leg, with outward rotation. Some edema and tenderness over the left hip upon palpation. No appreciable pedal edema of the right leg. Pedal pulses palpable. Neurologic: Alert and oriented x2. Cranial nerves II through XII grossly intact. Basic Metabolic Panel:  Basename 04/24/11 0555  NA 132*  K 3.9  CL 97  CO2 28  GLUCOSE 189*  BUN 9  CREATININE 0.67  CALCIUM 8.8  MG --  PHOS --   Liver Function Tests: No results found for this basename: AST:2,ALT:2,ALKPHOS:2,BILITOT:2,PROT:2,ALBUMIN:2 in the last 72 hours No results found for this basename: LIPASE:2,AMYLASE:2 in the last 72 hours No results found for this basename: AMMONIA:2 in the last 72 hours CBC:  Basename 04/24/11 0555 04/22/11 0749  WBC 14.6* 15.2*  NEUTROABS -- 11.3*  HGB 11.3* 11.9*  HCT 34.8* 37.3  MCV 87.7 89.4  PLT 268 262   Cardiac Enzymes:  Basename 04/24/11  0757  CKTOTAL 87  CKMB 2.0  CKMBINDEX --  TROPONINI <0.30   BNP:  Basename 04/24/11 0755  POCBNP 1242.0*   D-Dimer: No results found for this basename: DDIMER:2 in the last 72 hours CBG: No results found for this basename: GLUCAP:6 in the last 72 hours Hemoglobin A1C: No results found for this basename: HGBA1C in the last 72 hours Fasting Lipid Panel: No results found for this basename: CHOL,HDL,LDLCALC,TRIG,CHOLHDL,LDLDIRECT in the last 72 hours Thyroid Function Tests: No results found for this basename: TSH,T4TOTAL,FREET4,T3FREE,THYROIDAB in the last 72 hours Anemia Panel: No results found for this basename: VITAMINB12,FOLATE,FERRITIN,TIBC,IRON,RETICCTPCT in the last 72 hours Urine Drug Screen:  Alcohol Level: No results found for this basename: ETH:2 in the last 72 hours Urinalysis:  Misc. Labs:   EKG:On 04/24/2011. Normal sinus rhythm with a heart rate of 84 beats per minute and nonspecific ST abnormalities.   Recent Results (from the past 240 hour(s))  URINE CULTURE     Status: Normal   Collection Time   04/20/11  5:37 PM      Component Value Range Status Comment   Specimen Description URINE, CATHETERIZED   Final    Special Requests NONE   Final    Setup Time [id number]   Final    Colony Count NO GROWTH   Final    Culture NO GROWTH   Final    Report Status 04/22/2011 FINAL   Final   SURGICAL PCR SCREEN     Status: Normal   Collection Time   04/21/11  9:21 PM      Component Value Range Status Comment   MRSA, PCR NEGATIVE  NEGATIVE  Final    Staphylococcus aureus NEGATIVE  NEGATIVE  Final      Results for orders placed during the Ford encounter of 04/20/11 (from the past 48 hour(s))  CBC     Status: Abnormal   Collection Time   04/24/11  5:55 AM      Component Value Range Comment   WBC 14.6 (*) 4.0 - 10.5 (K/uL)    RBC 3.97  3.87 - 5.11 (MIL/uL)    Hemoglobin 11.3 (*) 12.0 -  15.0 (g/dL)    HCT 47.8 (*) 29.5 - 46.0 (%)    MCV 87.7  78.0 - 100.0 (fL)     MCH 28.5  26.0 - 34.0 (pg)    MCHC 32.5  30.0 - 36.0 (g/dL)    RDW 62.1  30.8 - 65.7 (%)    Platelets 268  150 - 400 (K/uL)   BASIC METABOLIC PANEL     Status: Abnormal   Collection Time   04/24/11  5:55 AM      Component Value Range Comment   Sodium 132 (*) 135 - 145 (mEq/L)    Potassium 3.9  3.5 - 5.1 (mEq/L)    Chloride 97  96 - 112 (mEq/L)    CO2 28  19 - 32 (mEq/L)    Glucose, Bld 189 (*) 70 - 99 (mg/dL)    BUN 9  6 - 23 (mg/dL)    Creatinine, Ser 8.46  0.50 - 1.10 (mg/dL)    Calcium 8.8  8.4 - 10.5 (mg/dL)    GFR calc non Af Amer 83 (*) >90 (mL/min)    GFR calc Af Amer >90  >90 (mL/min)   PREPARE RBC (CROSSMATCH)     Status: Normal   Collection Time   04/24/11  7:49 AM      Component Value Range Comment   Order Confirmation ORDER PROCESSED BY BLOOD BANK     TYPE AND SCREEN     Status: Normal (Preliminary result)   Collection Time   04/24/11  7:49 AM      Component Value Range Comment   ABO/RH(D) O POS      Antibody Screen NEG      Sample Expiration 04/27/2011      Unit Number [id number]      Blood Component Type RED CELLS,LR      Unit division 00      Status of Unit ALLOCATED      Transfusion Status OK TO TRANSFUSE      Crossmatch Result Compatible      Unit Number [id number]      Blood Component Type RED CELLS,LR      Unit division 00      Status of Unit ALLOCATED      Transfusion Status OK TO TRANSFUSE      Crossmatch Result Compatible     PRO B NATRIURETIC PEPTIDE     Status: Abnormal   Collection Time   04/24/11  7:55 AM      Component Value Range Comment   BNP, POC 1242.0 (*) 0 - 450 (pg/mL)   CARDIAC PANEL(CRET KIN+CKTOT+MB+TROPI)     Status: Normal   Collection Time   04/24/11  7:57 AM      Component Value Range Comment   Total CK 87  7 - 177 (U/L)    CK, MB 2.0  0.3 - 4.0 (ng/mL)    Troponin I <0.30  <0.30 (ng/mL)    Relative Index RELATIVE INDEX IS INVALID  0.0 - 2.5      Dg Ribs Unilateral W/chest Left  04/20/2011  *RADIOLOGY REPORT*  Clinical  Data: Fall.  Left chest injury and rib pain.  LEFT RIBS AND CHEST - 3+ VIEW  Comparison: 05/10/2010  Findings: Angulation of the cortex is seen involving the left anterior 9th rib, suspicious for a nondisplaced rib fracture.  No other rib fractures are identified.  No evidence of pneumothorax or hemothorax.  Para pulmonary hyperinflation is seen, consistent with COPD.  Both lungs are clear.  Mild cardiomegaly stable.  Ectasia of the thoracic aorta and great vessels is stable.  IMPRESSION:  1.  Probable nondisplaced fracture of the left anterior 9th rib. 2.  Stable mild cardiomegaly and COPD.  No active cardiopulmonary disease.  Original Report Authenticated By: Danae Orleans, M.D.   Dg Hip Complete Left  04/20/2011  *RADIOLOGY REPORT*  Clinical Data: Fall.  Left hip pain.  LEFT HIP - COMPLETE 2+ VIEW  Comparison: None.  Findings: A intertrochanteric left hip fracture is seen.  No evidence of dislocation.  No acetabular pelvic fracture identified. The lower lumbar spine degenerative changes noted.  IMPRESSION: Intertrochanteric left hip fracture.  Per CMS PQRS reporting requirements (PQRS Measure 24): Given the patient's age of greater than 50 and the fracture site (hip, distal radius, or spine), the patient should be tested for osteoporosis using DXA, and the appropriate treatment considered based on the DXA results.  Original Report Authenticated By: Danae Orleans, M.D.   Dg Chest Portable 1 View  04/24/2011  *RADIOLOGY REPORT*  Clinical Data: Fever, hip fracture, asthma, wheezing  PORTABLE CHEST - 1 VIEW  Comparison: Portable exam 0903 hours compared to09/29/2012  Findings: Minimal enlargement of cardiac silhouette. Atherosclerotic calcification aorta. Pulmonary vascularity normal. Minimal atelectasis left base. Question underlying emphysematous changes. No acute infiltrate, pleural effusion or pneumothorax. Diffuse osseous demineralization.  IMPRESSION: Enlargement of cardiac silhouette. Question  emphysematous changes with left base atelectasis.  Original Report Authenticated By: Lollie Marrow, M.D.    Impression:  Active Problems:  Leukocytosis  Hyponatremia  Fracture of left hip  HTN (hypertension)  CAD (coronary artery disease)  Fever  Asthmatic bronchitis  Anemia  Hypothyroidism  Hyperglycemia  1. Leukocytosis and fever overnight. A chest x-ray was ordered today per recommendation, and it reveals emphysematous and left base atelectasis. She did have an IV in on admission, however, access has been lost and therefore a PIC line is being placed. No obvious erythema at the previous IV site. Her urinalysis on admission was essentially negative, however, that was 3 days ago.  Mild hyponatremia. Her serum sodium was 135 on admission and today it is 132.  Hyperglycemia. The patient's venous glucose was 144 yesterday and it is 189 today. She has no history of diabetes mellitus.  Hypertension. Well-controlled on medications. Coronary artery disease. Currently stable. Cardiac enzymes x1 are within normal limits. The patient has been evaluated by [facility] cardiology and she is apparently stable from their standpoint. History of PSVT. Currently stable on medications.  Hypothyroidism. Currently stable on level thyroxine.  Anemia. Likely hemodilution will from IV fluids. Her hemoglobin was 12.6 on admission and is 11.3 today.  Asthmatic bronchitis. Currently stable.   Acute left hip fracture, status post fall. Per Dr. Hilda Lias.  Plan: Recommend checking another urinalysis and culture. Will add Levaquin empirically. Will order blood cultures x2 if she becomes febrile again. Consider adding vancomycin if she becomes febrile again.  We will order a TSH, free T4, and hemoglobin A1c.  Will order laboratory studies in the morning. We'll continue to monitor her hemoglobin, hematocrit, serum sodium, and fever curve.  We'll follow along with you.      Kirke Breach 04/24/2011, 2:33  PM

## 2011-04-24 NOTE — Progress Notes (Signed)
She had a temperature to 101.4 last night.  She is afebrile now.  I will get a urine c&s.  She feels well.  She is scheduled to have left hip surgery tomorrow early afternoon.  I have asked the hospitalist to see her.  Other vitals are normal.  Her pain is controlled.  She is tired of waiting for the surgery.  Her labs look good.

## 2011-04-24 NOTE — Progress Notes (Signed)
Inpatient Diabetes Program Recommendations  AACE/ADA: New Consensus Statement on Inpatient Glycemic Control (2009)  Target Ranges:  Prepandial:   less than 140 mg/dL      Peak postprandial:   less than 180 mg/dL (1-2 hours)      Critically ill patients:  140 - 180 mg/dL   Reason for Visit: Elevated lab glucose:  189 mg/dL  Inpatient Diabetes Program Recommendations Correction (SSI): Add Novolog Correction HgbA1C: Order HgbA1C to assess glycemic control

## 2011-04-24 NOTE — Plan of Care (Signed)
Problem: Consults Goal: Diabetes Guidelines if Diabetic/Glucose > 140 If diabetic or lab glucose is > 140 mg/dl - Initiate Diabetes/Hyperglycemia Guidelines & Document Interventions  Outcome: Progressing Path reviewed and patient assessed.  Recommendations made.     

## 2011-04-25 ENCOUNTER — Encounter (HOSPITAL_COMMUNITY)
Admission: EM | Disposition: A | Discharge status: Skilled Nursing Facility | Payer: Self-pay | Source: Home / Self Care | Attending: Orthopaedic Surgery

## 2011-04-25 ENCOUNTER — Inpatient Hospital Stay (HOSPITAL_COMMUNITY): Payer: Medicare Other

## 2011-04-25 ENCOUNTER — Inpatient Hospital Stay (HOSPITAL_COMMUNITY): Payer: Medicare Other | Admitting: Anesthesiology

## 2011-04-25 ENCOUNTER — Encounter (HOSPITAL_COMMUNITY): Payer: Self-pay | Admitting: Anesthesiology

## 2011-04-25 DIAGNOSIS — N390 Urinary tract infection, site not specified: Secondary | ICD-10-CM | POA: Diagnosis not present

## 2011-04-25 HISTORY — PX: ORIF HIP FRACTURE: SHX2125

## 2011-04-25 LAB — BASIC METABOLIC PANEL
Chloride: 96 mEq/L (ref 96–112)
GFR calc non Af Amer: 81 mL/min — ABNORMAL LOW (ref >90)
Glucose, Bld: 176 mg/dL — ABNORMAL HIGH (ref 70–99)
Potassium: 4.1 mEq/L (ref 3.5–5.1)
Sodium: 132 mEq/L — ABNORMAL LOW (ref 135–145)

## 2011-04-25 LAB — CBC
HCT: 33.2 % — ABNORMAL LOW (ref 36.0–46.0)
Hemoglobin: 10.7 g/dL — ABNORMAL LOW (ref 12.0–15.0)
RBC: 3.77 MIL/uL — ABNORMAL LOW (ref 3.87–5.11)
WBC: 13.4 10*3/uL — ABNORMAL HIGH (ref 4.0–10.5)

## 2011-04-25 LAB — COMPREHENSIVE METABOLIC PANEL
ALT: 13 U/L (ref 0–35)
Alkaline Phosphatase: 88 U/L (ref 39–117)
BUN: 14 mg/dL (ref 6–23)
CO2: 30 mEq/L (ref 19–32)
Chloride: 97 mEq/L (ref 96–112)
GFR calc Af Amer: >90 mL/min (ref >90)
GFR calc non Af Amer: 79 mL/min — ABNORMAL LOW (ref >90)
Glucose, Bld: 140 mg/dL — ABNORMAL HIGH (ref 70–99)
Potassium: 4.3 mEq/L (ref 3.5–5.1)
Sodium: 132 mEq/L — ABNORMAL LOW (ref 135–145)
Total Bilirubin: 0.4 mg/dL (ref 0.3–1.2)
Total Protein: 6.6 g/dL (ref 6.0–8.3)

## 2011-04-25 LAB — HEMOGLOBIN A1C: Hgb A1c MFr Bld: 6.6 % — ABNORMAL HIGH (ref <5.7)

## 2011-04-25 SURGERY — OPEN REDUCTION INTERNAL FIXATION HIP
Anesthesia: Spinal | Site: Hip | Laterality: Left | Wound class: Clean

## 2011-04-25 MED ORDER — HYDROMORPHONE 0.3 MG/ML IV SOLN
INTRAVENOUS | Status: AC
  Administered 2011-04-25: 25 mL via INTRAVENOUS
  Filled 2011-04-25: qty 25

## 2011-04-25 MED ORDER — FENTANYL CITRATE 0.05 MG/ML IJ SOLN
INTRAMUSCULAR | Status: DC | PRN
  Administered 2011-04-25: 50 ug via INTRAVENOUS
  Administered 2011-04-25: 25 ug via INTRAVENOUS

## 2011-04-25 MED ORDER — BUPIVACAINE HCL 0.75 % IJ SOLN
INTRAMUSCULAR | Status: DC | PRN
  Administered 2011-04-25: 11 mg via INTRATHECAL

## 2011-04-25 MED ORDER — CEFAZOLIN SODIUM 1-5 GM-% IV SOLN
INTRAVENOUS | Status: AC
  Administered 2011-04-25: 2 g via INTRAVENOUS
  Filled 2011-04-25: qty 100

## 2011-04-25 MED ORDER — ACETAMINOPHEN 325 MG PO TABS
650.0000 mg | ORAL_TABLET | Freq: Four times a day (QID) | ORAL | Status: DC | PRN
  Administered 2011-04-25 – 2011-04-29 (×5): 650 mg via ORAL
  Filled 2011-04-25 (×3): qty 2

## 2011-04-25 MED ORDER — MIDAZOLAM HCL 5 MG/5ML IJ SOLN
INTRAMUSCULAR | Status: DC | PRN
  Administered 2011-04-25: 2 mg via INTRAVENOUS

## 2011-04-25 MED ORDER — ZOLPIDEM TARTRATE 5 MG PO TABS
5.0000 mg | ORAL_TABLET | Freq: Every day | ORAL | Status: DC
  Administered 2011-04-25 – 2011-04-29 (×3): 5 mg via ORAL
  Filled 2011-04-25 (×3): qty 1

## 2011-04-25 MED ORDER — FENTANYL CITRATE 0.05 MG/ML IJ SOLN: 25.0000 ug | INTRAMUSCULAR | Status: DC | PRN

## 2011-04-25 MED ORDER — ENOXAPARIN SODIUM 30 MG/0.3ML ~~LOC~~ SOLN
30.0000 mg | Freq: Two times a day (BID) | SUBCUTANEOUS | Status: DC
  Administered 2011-04-26 – 2011-04-30 (×9): 30 mg via SUBCUTANEOUS
  Filled 2011-04-25 (×9): qty 0.3

## 2011-04-25 MED ORDER — LEVOFLOXACIN IN D5W 500 MG/100ML IV SOLN
500.0000 mg | INTRAVENOUS | Status: DC
  Filled 2011-04-25 (×4): qty 100

## 2011-04-25 MED ORDER — ALBUTEROL SULFATE (5 MG/ML) 0.5% IN NEBU
2.5000 mg | INHALATION_SOLUTION | Freq: Four times a day (QID) | RESPIRATORY_TRACT | Status: AC
  Administered 2011-04-26 – 2011-04-28 (×10): 2.5 mg via RESPIRATORY_TRACT
  Filled 2011-04-25 (×10): qty 0.5

## 2011-04-25 MED ORDER — LACTATED RINGERS IV SOLN
INTRAVENOUS | Status: DC
  Administered 2011-04-25: 13:00:00 via INTRAVENOUS

## 2011-04-25 MED ORDER — ONDANSETRON HCL 4 MG/2ML IJ SOLN: 4.0000 mg | Freq: Once | INTRAMUSCULAR | Status: DC | PRN

## 2011-04-25 MED ORDER — MIDAZOLAM HCL 2 MG/2ML IJ SOLN
INTRAMUSCULAR | Status: AC
  Filled 2011-04-25: qty 2

## 2011-04-25 MED ORDER — SODIUM CHLORIDE 0.9 % IN NEBU
INHALATION_SOLUTION | RESPIRATORY_TRACT | Status: AC
  Administered 2011-04-25: 22:00:00
  Filled 2011-04-25: qty 3

## 2011-04-25 MED ORDER — PROMETHAZINE HCL 25 MG/ML IJ SOLN: 12.5000 mg | INTRAMUSCULAR | Status: DC | PRN

## 2011-04-25 MED ORDER — FENTANYL CITRATE 0.05 MG/ML IJ SOLN
INTRAMUSCULAR | Status: DC | PRN
  Administered 2011-04-25: 25 ug via INTRATHECAL

## 2011-04-25 MED ORDER — MIDAZOLAM HCL 2 MG/2ML IJ SOLN
1.0000 mg | INTRAMUSCULAR | Status: DC | PRN
  Administered 2011-04-25: 2 mg via INTRAVENOUS

## 2011-04-25 MED ORDER — PROPOFOL 10 MG/ML IV EMUL
INTRAVENOUS | Status: DC | PRN
  Administered 2011-04-25: 25 ug/kg/min via INTRAVENOUS

## 2011-04-25 MED ORDER — MAGNESIUM HYDROXIDE 400 MG/5ML PO SUSP: 30.0000 mL | Freq: Every day | ORAL | Status: DC | PRN

## 2011-04-25 SURGICAL SUPPLY — 55 items
BAG HAMPER (MISCELLANEOUS) ×2 IMPLANT
BIT DRILL 6.5 DISP STRL (BIT) ×2 IMPLANT
BIT DRILL TWIST 3.5MM (BIT) ×1 IMPLANT
BLADE SURG SZ10 CARB STEEL (BLADE) ×4 IMPLANT
BLADE SURG SZ20 CARB STEEL (BLADE) ×2 IMPLANT
CLOTH BEACON ORANGE TIMEOUT ST (SAFETY) ×2 IMPLANT
COVER LIGHT HANDLE STERIS (MISCELLANEOUS) ×4 IMPLANT
COVER MAYO STAND XLG (DRAPE) ×2 IMPLANT
DRAPE STERI IOBAN 125X83 (DRAPES) ×2 IMPLANT
DRILL OMEGA BONE (BIT) IMPLANT
DRILL TWIST 3.5MM (BIT) ×2
ELECT REM PT RETURN 9FT ADLT (ELECTROSURGICAL) ×2
ELECTRODE REM PT RTRN 9FT ADLT (ELECTROSURGICAL) ×1 IMPLANT
EVACUATOR 3/16  PVC DRAIN (DRAIN) ×1
EVACUATOR 3/16 PVC DRAIN (DRAIN) ×1 IMPLANT
GAUZE XEROFORM 5X9 LF (GAUZE/BANDAGES/DRESSINGS) ×2 IMPLANT
GLOVE BIO SURGEON STRL SZ8 (GLOVE) ×2 IMPLANT
GLOVE BIO SURGEON STRL SZ8.5 (GLOVE) ×2 IMPLANT
GLOVE ECLIPSE 7.0 STRL STRAW (GLOVE) ×2 IMPLANT
GLOVE ECLIPSE 8.0 STRL XLNG CF (GLOVE) ×2 IMPLANT
GLOVE INDICATOR 7.5 STRL GRN (GLOVE) ×2 IMPLANT
GLOVE INDICATOR 8.5 STRL (GLOVE) ×2 IMPLANT
GOWN BRE IMP SLV AUR XL STRL (GOWN DISPOSABLE) ×4 IMPLANT
GOWN STRL REIN 3XL LVL4 (GOWN DISPOSABLE) ×2 IMPLANT
GOWN STRL REIN XL XLG (GOWN DISPOSABLE) ×2 IMPLANT
GUIDE PIN CALIBRATED (PIN) ×2 IMPLANT
INST SET MAJOR BONE (KITS) ×2 IMPLANT
KIT BLADEGUARD II DBL (SET/KITS/TRAYS/PACK) ×2 IMPLANT
KIT ROOM TURNOVER AP CYSTO (KITS) ×2 IMPLANT
MANIFOLD NEPTUNE II (INSTRUMENTS) ×2 IMPLANT
MARKER SKIN DUAL TIP RULER LAB (MISCELLANEOUS) ×2 IMPLANT
NS IRRIG 1000ML POUR BTL (IV SOLUTION) ×2 IMPLANT
PACK BASIC III (CUSTOM PROCEDURE TRAY) ×1
PACK SRG BSC III STRL LF ECLPS (CUSTOM PROCEDURE TRAY) ×1 IMPLANT
PAD ABD 5X9 TENDERSORB (GAUZE/BANDAGES/DRESSINGS) ×8 IMPLANT
PAD ARMBOARD 7.5X6 YLW CONV (MISCELLANEOUS) ×2 IMPLANT
PENCIL HANDSWITCHING (ELECTRODE) ×2 IMPLANT
PLATE SHORT BARREL 135X4 (Plate) ×2 IMPLANT
SCREW CORTICAL SFTP 4.5X38MM (Screw) ×6 IMPLANT
SCREW CORTICAL SFTP 4.5X42MM (Screw) ×2 IMPLANT
SCREW LAG 85MM (Screw) ×1 IMPLANT
SCREW LAGSTD 85X21X12.7X9 (Screw) ×1 IMPLANT
SET BASIN LINEN APH (SET/KITS/TRAYS/PACK) ×2 IMPLANT
SPONGE DRAIN TRACH 4X4 STRL 2S (GAUZE/BANDAGES/DRESSINGS) ×2 IMPLANT
SPONGE GAUZE 4X4 12PLY (GAUZE/BANDAGES/DRESSINGS) ×2 IMPLANT
SPONGE LAP 18X18 X RAY DECT (DISPOSABLE) ×4 IMPLANT
STAPLER VISISTAT 35W (STAPLE) ×2 IMPLANT
SUT BRALON NAB BRD #1 30IN (SUTURE) ×6 IMPLANT
SUT ETHILON 3 0 FSL (SUTURE) ×4 IMPLANT
SUT PLAIN 2 0 XLH (SUTURE) ×6 IMPLANT
SUT SILK 0 FSL (SUTURE) ×2 IMPLANT
SYR BULB IRRIGATION 50ML (SYRINGE) ×2 IMPLANT
TAPE MEDIFIX FOAM 3 (GAUZE/BANDAGES/DRESSINGS) ×2 IMPLANT
YANKAUER SUCT 12FT TUBE ARGYLE (SUCTIONS) ×2 IMPLANT
YANKAUER SUCT BULB TIP 10FT TU (MISCELLANEOUS) ×2 IMPLANT

## 2011-04-25 NOTE — Brief Op Note (Signed)
04/20/2011 - 04/25/2011  2:35 PM  PATIENT:  Katie Ford  77 y.o. female  PRE-OPERATIVE DIAGNOSIS:  fracture left hip, Intertrochanteric, comminuted  POST-OPERATIVE DIAGNOSIS:  fracture left hip  PROCEDURE:  Procedure(s): OPEN REDUCTION INTERNAL FIXATION HIP with Richard Compression Screw System, 135 degree short barrel, four hole side plate and a 85 mm compression screw  SURGEON:  Surgeon(s): [employer]  PHYSICIAN ASSISTANT:   ASSISTANTS: none   ANESTHESIA:   spinal  OR FLUID I/O:  Total I/O In: 100 [I.V.:100] Out: 300 [Urine:200; Blood:100]  BLOOD ADMINISTERED:none  DRAINS: Large hemovac drain left hip   LOCAL MEDICATIONS USED:  NONE  SPECIMEN:  No Specimen  DISPOSITION OF SPECIMEN:  N/A  COUNTS:  YES  TOURNIQUET:  * No tourniquets in log *  DICTATION: .Other Dictation: Dictation Number [id number]  PLAN OF CARE: Admit to inpatient   PATIENT DISPOSITION:  PACU - hemodynamically stable.   Delay start of Pharmacological VTE agent (>24hrs) due to surgical blood loss or risk of bleeding:  no

## 2011-04-25 NOTE — Progress Notes (Signed)
Her temp went up to 100.7 last night.  She is afebrile now.  I plan to do surgery on the left hip today if her temperature remains normal or near normal.  Her other vitals are normal.

## 2011-04-25 NOTE — Progress Notes (Signed)
Chart reviewed. Back from surgery 15 minutes ago.  Subjective: Complaining of chest congestion. Complaining of left-sided chest wall pain  Objective: Vital signs in last 24 hours: Filed Vitals:   04/25/11 1515 04/25/11 1530 04/25/11 1541 04/25/11 1617  BP: 159/64 146/57 153/83 138/77  Pulse: 93 94 94 92  Temp:   98.6 F (37 C)   TempSrc:   Oral Oral  Resp: 22 22 22 22   Height:      Weight:      SpO2: 94% 95% 96%    Weight change:   Intake/Output Summary (Last 24 hours) at 04/25/11 1625 Last data filed at 04/25/11 1450  Gross per 24 hour  Intake   1240 ml  Output   2220 ml  Net   -980 ml   General: Comfortable, alert and oriented. Lungs clear to auscultation bilaterally without wheeze rhonchi or rales Cardiovascular regular rate rhythm without murmurs gallops rubs Musculoskeletal: She does have tenderness under her left breast no bruising or deformity. Abdomen soft nontender nondistended Extremities sequential compression devices are in place. No edema.  Lab Results: Basic Metabolic Panel:  Lab 04/25/11 1610 04/24/11 0555  NA 132* 132*  K 4.3 3.9  CL 97 97  CO2 30 28  GLUCOSE 140* 189*  BUN 14 9  CREATININE 0.78 0.67  CALCIUM 8.9 8.8  MG -- --  PHOS -- --   Liver Function Tests:  Lab 04/25/11 0425 04/20/11 1720  AST 15 28  ALT 13 22  ALKPHOS 88 105  BILITOT 0.4 0.2*  PROT 6.6 7.2  ALBUMIN 2.2* 3.5   No results found for this basename: LIPASE:2,AMYLASE:2 in the last 168 hours No results found for this basename: AMMONIA:2 in the last 168 hours CBC:  Lab 04/25/11 0425 04/24/11 0555 04/22/11 0749 04/20/11 1720  WBC 13.4* 14.6* -- --  NEUTROABS -- -- 11.3* 9.3*  HGB 10.7* 11.3* -- --  HCT 33.2* 34.8* -- --  MCV 88.1 87.7 -- --  PLT 257 268 -- --   Cardiac Enzymes:  Lab 04/24/11 0757  CKTOTAL 87  CKMB 2.0  CKMBINDEX --  TROPONINI <0.30   BNP:  Lab 04/24/11 0755  POCBNP 1242.0*   D-Dimer: No results found for this basename: DDIMER:2 in the  last 168 hours CBG: No results found for this basename: GLUCAP:6 in the last 168 hours Hemoglobin A1C: No results found for this basename: HGBA1C in the last 168 hours Fasting Lipid Panel: No results found for this basename: CHOL,HDL,LDLCALC,TRIG,CHOLHDL,LDLDIRECT in the last 960 hours Thyroid Function Tests:  Lab 04/24/11 0758  TSH 2.445  T4TOTAL --  FREET4 1.36  T3FREE --  THYROIDAB --   Anemia Panel: No results found for this basename: VITAMINB12,FOLATE,FERRITIN,TIBC,IRON,RETICCTPCT in the last 168 hours  Alcohol Level: No results found for this basename: ETH:2 in the last 168 hours Urinalysis: Specific gravity 1.015 pH 7.0 glucose 250 protein 30 nitrates positive moderate leukocyte esterase too numerous to count white cells 3-6 red cells few squamous epithelial cells many bacteria urine culture growing greater than 100,000 colonies of gram-negative rods  Micro Results: Recent Results (from the past 240 hour(s))  URINE CULTURE     Status: Normal   Collection Time   04/20/11  5:37 PM      Component Value Range Status Comment   Specimen Description URINE, CATHETERIZED   Final    Special Requests NONE   Final    Setup Time [id number]   Final    Colony Count NO GROWTH  Final    Culture NO GROWTH   Final    Report Status 04/22/2011 FINAL   Final   SURGICAL PCR SCREEN     Status: Normal   Collection Time   04/21/11  9:21 PM      Component Value Range Status Comment   MRSA, PCR NEGATIVE  NEGATIVE  Final    Staphylococcus aureus NEGATIVE  NEGATIVE  Final   URINE CULTURE     Status: Normal (Preliminary result)   Collection Time   04/24/11  9:27 AM      Component Value Range Status Comment   Specimen Description URINE, CATHETERIZED   Final    Special Requests NONE   Final    Setup Time [id number]   Final    Colony Count >=100,000 COLONIES/ML   Final    Culture GRAM NEGATIVE RODS   Final    Report Status PENDING   Incomplete    Studies/Results: Dg Hip Operative  Left  04/25/2011  *RADIOLOGY REPORT*  Clinical Data: Left hip fracture, ORIF  OPERATIVE LEFT HIP  Comparison: 04/20/2011  Findings: Three digital C-arm fluoroscopic images obtained intraoperatively. Images are interpreted postoperatively. 19 seconds of fluoroscopy utilized by Dr. Hilda Lias.  Images demonstrate placement of a lateral plate and compression screw across a reduced intertrochanteric fracture of the proximal left femur. No femoral head dislocation or acute complications seen.  IMPRESSION: Post ORIF of intertrochanteric fracture left femur.  Original Report Authenticated By: Lollie Marrow, M.D.   Dg Chest Portable 1 View  04/24/2011  *RADIOLOGY REPORT*  Clinical Data: PICC placement.  PORTABLE CHEST - 1 VIEW  Comparison: 04/24/2011.  Findings: The left lung apex is excluded from view.  Left upper extremity PICC is present with the tip terminating in the mid SVC. This is new compared to the prior exam of 04/24/2011 at 0903 hours. Basilar atelectasis is present.  Cardiopericardial silhouette borderline for projection.  IMPRESSION: New left upper extremity PICC with the tip in the mid SVC.  Original Report Authenticated By: Andreas Newport, M.D.   Dg Chest Portable 1 View  04/24/2011  *RADIOLOGY REPORT*  Clinical Data: Fever, hip fracture, asthma, wheezing  PORTABLE CHEST - 1 VIEW  Comparison: Portable exam 0903 hours compared to09/29/2012  Findings: Minimal enlargement of cardiac silhouette. Atherosclerotic calcification aorta. Pulmonary vascularity normal. Minimal atelectasis left base. Question underlying emphysematous changes. No acute infiltrate, pleural effusion or pneumothorax. Diffuse osseous demineralization.  IMPRESSION: Enlargement of cardiac silhouette. Question emphysematous changes with left base atelectasis.  Original Report Authenticated By: Lollie Marrow, M.D.   Medications: I have reviewed the patient's current medications. Scheduled Meds:   . albuterol  2.5 mg Nebulization QID  .  albuterol  2.5 mg Nebulization Q6H  . amLODipine  10 mg Oral Daily  . antiseptic oral rinse   Mouth Rinse BID  . CALCIUM-MAGNESUIUM-ZINC  1 tablet Oral Daily  . calcium-vitamin D  1 tablet Oral Daily  . ceFAZolin      . ceFAZolin (ANCEF) IV  1 g Intravenous Once  . ceFAZolin (ANCEF) IV  1 g Intravenous Once  . digoxin  250 mcg Oral Daily  . enoxaparin  30 mg Subcutaneous Q12H  . fluticasone  1 puff Inhalation BID  . guaiFENesin  1,200 mg Oral Daily  . HYDROmorphone PCA 0.3 mg/mL   Intravenous Q4H  . levofloxacin (LEVAQUIN) IV  750 mg Intravenous Q24H  . levothyroxine  50 mcg Oral Daily  . loratadine  10 mg Oral Daily  . metoprolol  50-100 mg Oral BID  . midazolam      . pantoprazole  40 mg Oral BID AC  . polyethylene glycol  17 g Oral Daily  . Polyvinyl Alcohol-Povidone  1 drop Both Eyes UD  . povidone-iodine   Topical Once  . rosuvastatin  10 mg Oral q1800  . sodium chloride  10 mL Intracatheter Q12H  . zolpidem  5 mg Oral QHS  . DISCONTD: ceFAZolin (ANCEF) IV  2 g Intravenous 60 min Pre-Op   Continuous Infusions:   . DISCONTD: lactated ringers 50 mL/hr at 04/25/11 1244   PRN Meds:.acetaminophen, acetaminophen, acetaminophen, albuterol, ALPRAZolam, alum & mag hydroxide-simeth, bisacodyl, diphenhydrAMINE, diphenhydrAMINE, HYDROmorphone, HYDROmorphone, magnesium hydroxide, naloxone, nitroGLYCERIN, ondansetron (ZOFRAN) IV, ondansetron (ZOFRAN) IV, ondansetron, promethazine, senna, sodium chloride, sodium chloride, sodium phosphate, DISCONTD: fentaNYL, DISCONTD: midazolam, DISCONTD: ondansetron (ZOFRAN) IV Assessment/Plan: Active Problems:  UTI (urinary tract infection)  Leukocytosis  Hyponatremia  Fracture of left hip  HTN (hypertension)  CAD (coronary artery disease)  Fever  Asthmatic bronchitis  Anemia  Hypothyroidism  Hyperglycemia  Fracture of rib of left side  Continue levofloxacin. Her chest pain is musculoskeletal, most likely injury from her fall. I will order  incentive spirometry.   LOS: 5 days   Nerine Pulse L 04/25/2011, 4:25 PM

## 2011-04-25 NOTE — Anesthesia Postprocedure Evaluation (Addendum)
  Anesthesia Post-op Note  Patient: Katie Ford  Procedure(s) Performed:  OPEN REDUCTION INTERNAL FIXATION HIP  Patient Location: PACU  Anesthesia Type: Spinal  Level of Consciousness: awake and alert   Airway and Oxygen Therapy: Patient Spontanous Breathing  Post-op Pain: none  Post-op Assessment: Post-op Vital signs reviewed and Patient's Cardiovascular Status Stable  Post-op Vital Signs: Reviewed and stable  Complications: No apparent anesthesia complications  04/26/2011  1130 Patient doing well.  VSS.  Denies headache or backache.  Sensation to legs returned to normal.

## 2011-04-25 NOTE — Transfer of Care (Signed)
Immediate Anesthesia Transfer of Care Note  Patient: Katie Ford  Procedure(s) Performed:  OPEN REDUCTION INTERNAL FIXATION HIP  Patient Location: PACU  Anesthesia Type: Spinal  Level of Consciousness: awake, alert  and oriented  Airway & Oxygen Therapy: Patient Spontanous Breathing and Patient connected to face mask oxygen  Post-op Assessment: Report given to PACU RN, Post -op Vital signs reviewed and stable and Patient moving all extremities  Post vital signs: Reviewed  Complications: No apparent anesthesia complications

## 2011-04-25 NOTE — Anesthesia Preprocedure Evaluation (Addendum)
Anesthesia Evaluation  Name, MR# and DOB Patient awake  General Assessment Comment  History of Anesthesia Complications Negative for: history of anesthetic complications  Airway Mallampati: I  Neck ROM: Full    Dental  (+) Teeth Intact   Pulmonary asthma  clear to auscultation        Cardiovascular hypertension, Pt. on medications and Pt. on home beta blockers + CAD (stents x 2 2011, off effient 5 days now.) + dysrhythmias + Valvular Problems/Murmurs MR Regular Normal    Neuro/Psych    GI/Hepatic GERD Medicated  Endo/Other  Hypothyroidism   Renal/GU      Musculoskeletal   Abdominal   Peds  Hematology   Anesthesia Other Findings   Reproductive/Obstetrics                           Anesthesia Physical Anesthesia Plan  ASA: III  Anesthesia Plan: Spinal   Post-op Pain Management:    Induction:   Airway Management Planned: Nasal Cannula  Additional Equipment:   Intra-op Plan:   Post-operative Plan:   Informed Consent: I have reviewed the patients History and Physical, chart, labs and discussed the procedure including the risks, benefits and alternatives for the proposed anesthesia with the patient or authorized representative who has indicated his/her understanding and acceptance.     Plan Discussed with:   Anesthesia Plan Comments:         Anesthesia Quick Evaluation

## 2011-04-25 NOTE — Anesthesia Procedure Notes (Addendum)
Spinal Block  Patient location during procedure: OR Start time: 04/25/2011 1:17 PM Staffing CRNA/Resident: Marylene Buerger Spinal Block Patient position: left lateral decubitus Prep: Betadine  Spinal Block  End time: 04/25/2011 1:27 PM Spinal Block Location: L3-4 Injection technique: single-shot Needle Needle type: Spinocan  Needle gauge: 22 G Additional Notes [id number] 2013   08 Marcaine  11 Mg Fentanyl25 Mcg Epi <o.1

## 2011-04-26 ENCOUNTER — Encounter (HOSPITAL_COMMUNITY): Payer: Medicare Other

## 2011-04-26 ENCOUNTER — Inpatient Hospital Stay (HOSPITAL_COMMUNITY): Payer: Medicare Other

## 2011-04-26 ENCOUNTER — Encounter (HOSPITAL_COMMUNITY): Payer: Self-pay | Admitting: Internal Medicine

## 2011-04-26 LAB — GLUCOSE, CAPILLARY: Glucose-Capillary: 153 mg/dL — ABNORMAL HIGH (ref 70–99)

## 2011-04-26 LAB — BASIC METABOLIC PANEL
BUN: 15 mg/dL (ref 6–23)
CO2: 30 mEq/L (ref 19–32)
Chloride: 94 mEq/L — ABNORMAL LOW (ref 96–112)
GFR calc Af Amer: >90 mL/min (ref >90)
Potassium: 3.8 mEq/L (ref 3.5–5.1)

## 2011-04-26 LAB — URINE CULTURE
Colony Count: >=100000
Culture  Setup Time: 201210031150

## 2011-04-26 LAB — DIFFERENTIAL
Lymphocytes Relative: 7 % — ABNORMAL LOW (ref 12–46)
Monocytes Absolute: 2.8 10*3/uL — ABNORMAL HIGH (ref 0.1–1.0)
Monocytes Relative: 19 % — ABNORMAL HIGH (ref 3–12)
Neutro Abs: 10.4 10*3/uL — ABNORMAL HIGH (ref 1.7–7.7)

## 2011-04-26 LAB — CBC
HCT: 33.2 % — ABNORMAL LOW (ref 36.0–46.0)
Hemoglobin: 10.6 g/dL — ABNORMAL LOW (ref 12.0–15.0)
MCHC: 31.9 g/dL (ref 30.0–36.0)
MCV: 88.3 fL (ref 78.0–100.0)
WBC: 14.4 10*3/uL — ABNORMAL HIGH (ref 4.0–10.5)

## 2011-04-26 MED ORDER — ASPIRIN EC 81 MG PO TBEC
81.0000 mg | DELAYED_RELEASE_TABLET | Freq: Every day | ORAL | Status: DC
  Administered 2011-04-26 – 2011-04-30 (×5): 81 mg via ORAL
  Filled 2011-04-26 (×5): qty 1

## 2011-04-26 MED ORDER — SODIUM CHLORIDE 0.9 % IJ SOLN
10.0000 mL | INTRAMUSCULAR | Status: DC | PRN
  Filled 2011-04-26 (×4): qty 10

## 2011-04-26 MED ORDER — METFORMIN HCL 500 MG PO TABS
500.0000 mg | ORAL_TABLET | Freq: Every day | ORAL | Status: DC
  Administered 2011-04-27 – 2011-04-30 (×4): 500 mg via ORAL
  Filled 2011-04-26 (×4): qty 1

## 2011-04-26 MED ORDER — FUROSEMIDE 20 MG PO TABS
20.0000 mg | ORAL_TABLET | Freq: Every day | ORAL | Status: AC
  Administered 2011-04-26: 20 mg via ORAL
  Filled 2011-04-26: qty 1

## 2011-04-26 MED ORDER — DEXTROSE 5 % IV SOLN
1.0000 g | INTRAVENOUS | Status: DC
  Administered 2011-04-26 – 2011-04-30 (×5): 1 g via INTRAVENOUS
  Filled 2011-04-26 (×6): qty 1

## 2011-04-26 MED ORDER — HYDROMORPHONE 0.3 MG/ML IV SOLN
INTRAVENOUS | Status: AC
  Filled 2011-04-26: qty 25

## 2011-04-26 MED ORDER — POTASSIUM CHLORIDE CRYS ER 20 MEQ PO TBCR
20.0000 meq | EXTENDED_RELEASE_TABLET | Freq: Two times a day (BID) | ORAL | Status: AC
  Administered 2011-04-26 (×2): 20 meq via ORAL
  Filled 2011-04-26 (×3): qty 1

## 2011-04-26 MED ORDER — SODIUM CHLORIDE 0.9 % IJ SOLN
10.0000 mL | Freq: Two times a day (BID) | INTRAMUSCULAR | Status: DC
  Administered 2011-04-26 – 2011-04-30 (×4): 10 mL
  Filled 2011-04-26 (×3): qty 10

## 2011-04-26 NOTE — Progress Notes (Signed)
Subjective:  The patient has no complaints of chest congestion or chest wall pain at this time. She was told in the past that she may have borderline diabetes mellitus.   Objective: Vital signs in last 24 hours: Filed Vitals:   04/26/11 0144 04/26/11 0400 04/26/11 0545 04/26/11 0824  BP:   161/72   Pulse:   93   Temp:   99.2 F (37.3 C)   TempSrc:      Resp:  18 20   Height:      Weight:      SpO2: 98% 97% 95% 95%   Weight change:   Intake/Output Summary (Last 24 hours) at 04/26/11 1150 Last data filed at 04/25/11 2000  Gross per 24 hour  Intake   1075 ml  Output    820 ml  Net    255 ml   General: Comfortable, alert and oriented. Lungs clear to auscultation bilaterally without wheeze rhonchi or rales Cardiovascular regular rate rhythm without murmurs gallops rubs Musculoskeletal: She does have tenderness under her left breast no bruising or deformity. Abdomen soft nontender nondistended Extremities sequential compression devices are in place. No edema.  Lab Results: Basic Metabolic Panel:  Lab 04/26/11 9147 04/25/11 1737  NA 131* 132*  K 3.8 4.1  CL 94* 96  CO2 30 29  GLUCOSE 160* 176*  BUN 15 13  CREATININE 0.79 0.71  CALCIUM 8.8 8.7  MG -- --  PHOS -- --   Liver Function Tests:  Lab 04/25/11 0425 04/20/11 1720  AST 15 28  ALT 13 22  ALKPHOS 88 105  BILITOT 0.4 0.2*  PROT 6.6 7.2  ALBUMIN 2.2* 3.5   No results found for this basename: LIPASE:2,AMYLASE:2 in the last 168 hours No results found for this basename: AMMONIA:2 in the last 168 hours CBC:  Lab 04/26/11 0450 04/25/11 0425 04/22/11 0749  WBC 14.4* 13.4* --  NEUTROABS 10.4* -- 11.3*  HGB 10.6* 10.7* --  HCT 33.2* 33.2* --  MCV 88.3 88.1 --  PLT 280 257 --   Cardiac Enzymes:  Lab 04/24/11 0757  CKTOTAL 87  CKMB 2.0  CKMBINDEX --  TROPONINI <0.30   BNP:  Lab 04/24/11 0755  POCBNP 1242.0*   D-Dimer: No results found for this basename: DDIMER:2 in the last 168  hours CBG: No results found for this basename: GLUCAP:6 in the last 168 hours Hemoglobin A1C:  Lab 04/25/11 0425  HGBA1C 6.6*   Fasting Lipid Panel: No results found for this basename: CHOL,HDL,LDLCALC,TRIG,CHOLHDL,LDLDIRECT in the last 829 hours Thyroid Function Tests:  Lab 04/24/11 0758  TSH 2.445  T4TOTAL --  FREET4 1.36  T3FREE --  THYROIDAB --   Anemia Panel: No results found for this basename: VITAMINB12,FOLATE,FERRITIN,TIBC,IRON,RETICCTPCT in the last 168 hours  Alcohol Level: No results found for this basename: ETH:2 in the last 168 hours Urinalysis: Specific gravity 1.015 pH 7.0 glucose 250 protein 30 nitrates positive moderate leukocyte esterase too numerous to count white cells 3-6 red cells few squamous epithelial cells many bacteria urine culture growing greater than 100,000 colonies of gram-negative rods  Micro Results: Recent Results (from the past 240 hour(s))  URINE CULTURE     Status: Normal   Collection Time   04/20/11  5:37 PM      Component Value Range Status Comment   Specimen Description URINE, CATHETERIZED   Final    Special Requests NONE   Final    Setup Time [id number]   Final    Colony Count  NO GROWTH   Final    Culture NO GROWTH   Final    Report Status 04/22/2011 FINAL   Final   SURGICAL PCR SCREEN     Status: Normal   Collection Time   04/21/11  9:21 PM      Component Value Range Status Comment   MRSA, PCR NEGATIVE  NEGATIVE  Final    Staphylococcus aureus NEGATIVE  NEGATIVE  Final   URINE CULTURE     Status: Normal   Collection Time   04/24/11  9:27 AM      Component Value Range Status Comment   Specimen Description URINE, CATHETERIZED   Final    Special Requests NONE   Final    Setup Time [id number]   Final    Colony Count >=100,000 COLONIES/ML   Final    Culture ESCHERICHIA COLI   Final    Report Status 04/26/2011 FINAL   Final    Organism ID, Bacteria ESCHERICHIA COLI   Final    Studies/Results: Dg Hip Operative  Left  04/25/2011  *RADIOLOGY REPORT*  Clinical Data: Left hip fracture, ORIF  OPERATIVE LEFT HIP  Comparison: 04/20/2011  Findings: Three digital C-arm fluoroscopic images obtained intraoperatively. Images are interpreted postoperatively. 19 seconds of fluoroscopy utilized by Dr. Hilda Lias.  Images demonstrate placement of a lateral plate and compression screw across a reduced intertrochanteric fracture of the proximal left femur. No femoral head dislocation or acute complications seen.  IMPRESSION: Post ORIF of intertrochanteric fracture left femur.  Original Report Authenticated By: Lollie Marrow, M.D.   Dg Chest Portable 1 View  04/24/2011  *RADIOLOGY REPORT*  Clinical Data: PICC placement.  PORTABLE CHEST - 1 VIEW  Comparison: 04/24/2011.  Findings: The left lung apex is excluded from view.  Left upper extremity PICC is present with the tip terminating in the mid SVC. This is new compared to the prior exam of 04/24/2011 at 0903 hours. Basilar atelectasis is present.  Cardiopericardial silhouette borderline for projection.  IMPRESSION: New left upper extremity PICC with the tip in the mid SVC.  Original Report Authenticated By: Andreas Newport, M.D.   Medications: I have reviewed the patient's current medications. Scheduled Meds:    . albuterol  2.5 mg Nebulization Q6H  . amLODipine  10 mg Oral Daily  . antiseptic oral rinse   Mouth Rinse BID  . aspirin  81 mg Oral Daily  . CALCIUM-MAGNESUIUM-ZINC  1 tablet Oral Daily  . calcium-vitamin D  1 tablet Oral Daily  . ceFAZolin      . ceFAZolin (ANCEF) IV  1 g Intravenous Once  . ceFAZolin (ANCEF) IV  1 g Intravenous Once  . cefTRIAXone (ROCEPHIN) IV  1 g Intravenous Q24H  . digoxin  250 mcg Oral Daily  . enoxaparin  30 mg Subcutaneous Q12H  . fluticasone  1 puff Inhalation BID  . guaiFENesin  1,200 mg Oral Daily  . HYDROmorphone PCA 0.3 mg/mL   Intravenous Q4H  . levothyroxine  50 mcg Oral Daily  . loratadine  10 mg Oral Daily  . metFORMIN  500  mg Oral Q breakfast  . metoprolol  50-100 mg Oral BID  . pantoprazole  40 mg Oral BID AC  . polyethylene glycol  17 g Oral Daily  . Polyvinyl Alcohol-Povidone  1 drop Both Eyes UD  . povidone-iodine   Topical Once  . rosuvastatin  10 mg Oral q1800  . sodium chloride  10 mL Intracatheter Q12H  . sodium chloride      .  zolpidem  5 mg Oral QHS  . DISCONTD: albuterol  2.5 mg Nebulization QID  . DISCONTD: ceFAZolin (ANCEF) IV  2 g Intravenous 60 min Pre-Op  . DISCONTD: levofloxacin (LEVAQUIN) IV  500 mg Intravenous Q24H  . DISCONTD: levofloxacin (LEVAQUIN) IV  750 mg Intravenous Q24H  . DISCONTD: midazolam       Continuous Infusions:    . DISCONTD: lactated ringers 50 mL/hr at 04/25/11 1244   PRN Meds:.acetaminophen, acetaminophen, acetaminophen, albuterol, ALPRAZolam, alum & mag hydroxide-simeth, bisacodyl, diphenhydrAMINE, diphenhydrAMINE, HYDROmorphone, HYDROmorphone, magnesium hydroxide, naloxone, nitroGLYCERIN, ondansetron (ZOFRAN) IV, ondansetron, promethazine, senna, sodium chloride, sodium chloride, sodium phosphate, DISCONTD: fentaNYL, DISCONTD: midazolam, DISCONTD: ondansetron (ZOFRAN) IV, DISCONTD: ondansetron (ZOFRAN) IV Assessment/Plan: Active Problems:  Leukocytosis  Hyponatremia  Fracture of left hip  HTN (hypertension)  CAD (coronary artery disease)  Fever  Asthmatic bronchitis  Anemia  Hypothyroidism  DM2 (diabetes mellitus, type 2)  Fracture of rib of left side  UTI (urinary tract infection)  1. Escherichia coli urinary tract infection. The Escherichia coli is sensitive to Levaquin, however, her followup urinalysis and reveals ongoing infection. Her white blood cell count is still elevated. The indwelling Foley catheter may be contributing to this. The pharmacist has recommended Rocephin; we will start this. We'll need to discontinue the Foley catheter when it is okay with Dr. Hilda Lias.  Fairly recent diagnosed type 2 diabetes mellitus. Her capillary blood glucose  has been consistently elevated. Her hemoglobin A1c is 6.6. She is being treated with sliding scale NovoLog in the hospital. She will benefit from a start of oral therapy. We will therefore start metformin at 500 mg daily. I doubt that she will need insulin when she is discharged. We'll order diabetes education.  Hyponatremia. IV fluids have been discontinued. Etiology is unknown at this time. We'll check a uric acid level to assess for SIADH. Because her BNP is elevated, we will give a trial of Lasix x1.   Hypertension. Her blood pressure is not optimally controlled however we will continue to monitor her on Norvasc and Toprol-XL. Further recommendations per cardiology.  Hypothyroidism. Her TSH and free T4 are within normal limits on Synthroid.   LOS: 6 days   Duvid Smalls 04/26/2011, 11:50 AM

## 2011-04-26 NOTE — Op Note (Signed)
NAMEJAIYLA, Katie Ford             ACCOUNT NO.:  [id number]  MEDICAL RECORD NO.:  [id number]  LOCATION:  A325                          FACILITY:  [facility]  PHYSICIAN:  J. Darreld Mclean, M.D. DATE OF BIRTH:  04-26-1932  DATE OF PROCEDURE: DATE OF DISCHARGE:                              OPERATIVE REPORT   PREOPERATIVE DIAGNOSIS:  Comminuted intertrochanteric fracture of the hip on the left.  POSTOPERATIVE DIAGNOSIS:  Comminuted intertrochanteric fracture of the hip on the left.  PROCEDURE:  Open treatment, internal reduction of left hip intertrochanteric fracture using a [employer] compression screw system with 135 degrees short barrel 4 hole, long side plate with 91-YN compression screw.  ANESTHESIA:  Spinal.  SURGEON:  J. Darreld Mclean, MD.  ESTIMATED BLOOD LOSS:  Minimal.  One large Hemovac drain.  INDICATIONS:  The patient fell on Saturday, April 20, 2011 and injured her left hip.  X-rays showed a comminuted fracture.  The patient was admitted.  However, surgery had to be delayed because she is on a new type anticoagulant.  Cardiology was consulted and they recommended 5 days' time with stopping anticoagulation until surgery and that is today.  I appreciate Cardiology's involvement with the patient.  The patient was aware of the delay from the time of admission to the time of surgery for safety reasons.  Although, we could have done other means, it would have caused potentially other problems and not follow the recommendations that I was given.  The patient is at increased risk because of underlying medical condition and heart problems as well.  The patient has been seen also by the hospitalist.  I appreciate their consultation as well.  I went over the risks and imponderables with the patient and the family including infection, blood clots that could lead to death, possibility of blood transfusion, need for physical therapy, and need for walker for 6-8  weeks may be longer, and anesthesia risk among others.  She appeared to understand and agreed to proceed with the procedure as outlined and asked appropriate questions.  DESCRIPTION OF PROCEDURE:  The patient was identified in the holding area and her left hip was marked as the site of surgery.  She was brought to the operating room and anesthesia was given.  She was placed on the fracture table.  AP and lateral views of the hip were taken after reduction on the table and these looked good.  These were accepted.  We had a time-out before doing any radiology procedure.  We went ahead on apron and thyroid shield and had our badges on.  The patient was prepped and draped in the usual manner, had a time-out once again and at this time identified the patient as Katie Ford and we are doing her left hip. All the instrumentations were properly working, the OR team knew each other, and knew the x-ray tech.  List of the patient's allergies were discussed as well.  Incision was then made through the skin, subcutaneous tissue, tensor fascia lata, vastus lateralis, femoral shaft, and guide pin was placed, looked good in AP and lateral views and measured 20, 90, and 85 mm.  85-mm compression screw was used.  First step  drill was used and the compression screw was inserted and then a four-hole 135 degrees side plate was then inserted.  Drill holes were made.  These measured from 48 mm to 32 mm.  Compression was applied to the system.  X-rays were taken in AP and lateral views.  These were acceptable and good.  Hemovac drain was placed and sewn with 2-0 silk suture.  Vastus lateralis reapproximated using a running locking #1 Surgilon suture.  The tensor fascia lata was reapproximated using figure- of-eight interrupted #1 Surgilon suture.  The patient had large amount of adipose tissues and this was closed in layers using 2-0 plain and skin staples.  The patient tolerated the procedure well and went  to recovery in good condition, of course remains admitted.          ______________________________ Shela Commons. Darreld Mclean, M.D.     JWK/MEDQ  D:  04/25/2011  T:  04/26/2011  Job:  [id number]

## 2011-04-26 NOTE — Progress Notes (Signed)
Encounter addended by: Glynn Octave on: 04/26/2011 11:31 AM<BR>     Documentation filed: Notes Section

## 2011-04-26 NOTE — Progress Notes (Signed)
She had temp spike last night to 102.6, afebrile now.  She got disoriented around 4:30 this am and pulled out her PICC line. Her daughter was in the room but was resting and was unaware until the nurse discovered it.  She is alert and oriented now.  Her daughter says she has been getting somewhat disoriented at home in the evenings.  I was unaware of this.  This is a recent event.  Her HGB is stable at 10.6.   Her sodium and chloride are slightly decreased.  She will need a new PICC line.  It has been ordered.  They will need to make sure it is well wrapped to keep her from pulling the new one out.  She will begin PT today.  NV is intact.  Her pain is controlled.   Her other vital signs are normal.

## 2011-04-26 NOTE — Progress Notes (Signed)
Inpatient Diabetes Program Recommendations  AACE/ADA: New Consensus Statement on Inpatient Glycemic Control (2009)  Target Ranges:  Prepandial:   less than 140 mg/dL      Peak postprandial:   less than 180 mg/dL (1-2 hours)      Critically ill patients:  140 - 180 mg/dL   Reason for Visit: Elevated HgbA1c 6.6%  Inpatient Diabetes Program Recommendations Correction (SSI): Add Novolog Correction HgbA1C: Please address elevated HgbA1c 6.6%:  ADA diagnostic criteria of DM: A1c >6.5% Diet: Add CHO modified medium to diet

## 2011-04-26 NOTE — Progress Notes (Addendum)
SUBJECTIVE:Some confusion overnight. Had some left sided chest burning, briefly when using trapeze bar. Otherwise no complaints.   Filed Vitals:   04/26/11 0144 04/26/11 0400 04/26/11 0545 04/26/11 0824  BP:   161/72   Pulse:   93   Temp:   99.2 F (37.3 C)   TempSrc:      Resp:  18 20   Height:      Weight:      SpO2: 98% 97% 95% 95%    Intake/Output Summary (Last 24 hours) at 04/26/11 1003 Last data filed at 04/25/11 2000  Gross per 24 hour  Intake   1075 ml  Output    820 ml  Net    255 ml    LABS: Basic Metabolic Panel:  Basename 04/26/11 0450 04/25/11 1737  NA 131* 132*  K 3.8 4.1  CL 94* 96  CO2 30 29  GLUCOSE 160* 176*  BUN 15 13  CREATININE 0.79 0.71  CALCIUM 8.8 8.7  MG -- --  PHOS -- --   Liver Function Tests:  Basename 04/25/11 0425  AST 15  ALT 13  ALKPHOS 88  BILITOT 0.4  PROT 6.6  ALBUMIN 2.2*   No results found for this basename: LIPASE:2,AMYLASE:2 in the last 72 hours CBC:  Basename 04/26/11 0450 04/25/11 0425  WBC 14.4* 13.4*  NEUTROABS 10.4* --  HGB 10.6* 10.7*  HCT 33.2* 33.2*  MCV 88.3 88.1  PLT 280 257   Cardiac Enzymes:  Basename 04/24/11 0757  CKTOTAL 87  CKMB 2.0  CKMBINDEX --  TROPONINI <0.30   BNP:  Basename 04/24/11 0755  POCBNP 1242.0*   Thyroid Function Tests:  Basename 04/24/11 0758  TSH 2.445  T4TOTAL --  T3FREE --  THYROIDAB --   Anemia Panel: No results found for this basename: VITAMINB12,FOLATE,FERRITIN,TIBC,IRON,RETICCTPCT in the last 72 hours  RADIOLOGY: Dg Ribs Unilateral W/chest Left  04/20/2011  *RADIOLOGY REPORT*  Clinical Data: Fall.  Left chest injury and rib pain.  LEFT RIBS AND CHEST - 3+ VIEW  Comparison: 05/10/2010  Findings: Angulation of the cortex is seen involving the left anterior 9th rib, suspicious for a nondisplaced rib fracture.  No other rib fractures are identified.  No evidence of pneumothorax or hemothorax.  Para pulmonary hyperinflation is seen, consistent with COPD.  Both  lungs are clear.  Mild cardiomegaly stable.  Ectasia of the thoracic aorta and great vessels is stable.  IMPRESSION:  1.  Probable nondisplaced fracture of the left anterior 9th rib. 2.  Stable mild cardiomegaly and COPD.  No active cardiopulmonary disease.  Original Report Authenticated By: Danae Orleans, M.D.   Dg Hip Complete Left  04/20/2011  *RADIOLOGY REPORT*  Clinical Data: Fall.  Left hip pain.  LEFT HIP - COMPLETE 2+ VIEW  Comparison: None.  Findings: A intertrochanteric left hip fracture is seen.  No evidence of dislocation.  No acetabular pelvic fracture identified. The lower lumbar spine degenerative changes noted.  IMPRESSION: Intertrochanteric left hip fracture.  Per CMS PQRS reporting requirements (PQRS Measure 24): Given the patient's age of greater than 50 and the fracture site (hip, distal radius, or spine), the patient should be tested for osteoporosis using DXA, and the appropriate treatment considered based on the DXA results.  Original Report Authenticated By: Danae Orleans, M.D.   Dg Hip Operative Left  04/25/2011  *RADIOLOGY REPORT*  Clinical Data: Left hip fracture, ORIF  OPERATIVE LEFT HIP  Comparison: 04/20/2011  Findings: Three digital C-arm fluoroscopic images obtained intraoperatively. Images  are interpreted postoperatively. 19 seconds of fluoroscopy utilized by Dr. Hilda Lias.  Images demonstrate placement of a lateral plate and compression screw across a reduced intertrochanteric fracture of the proximal left femur. No femoral head dislocation or acute complications seen.  IMPRESSION: Post ORIF of intertrochanteric fracture left femur.  Original Report Authenticated By: Lollie Marrow, M.D.   Dg Chest Portable 1 View  04/24/2011  *RADIOLOGY REPORT*  Clinical Data: PICC placement.  PORTABLE CHEST - 1 VIEW  Comparison: 04/24/2011.  Findings: The left lung apex is excluded from view.  Left upper extremity PICC is present with the tip terminating in the mid SVC. This is new compared  to the prior exam of 04/24/2011 at 0903 hours. Basilar atelectasis is present.  Cardiopericardial silhouette borderline for projection.  IMPRESSION: New left upper extremity PICC with the tip in the mid SVC.  Original Report Authenticated By: Andreas Newport, M.D.   Dg Chest Portable 1 View  04/24/2011  *RADIOLOGY REPORT*  Clinical Data: Fever, hip fracture, asthma, wheezing  PORTABLE CHEST - 1 VIEW  Comparison: Portable exam 0903 hours compared to09/29/2012  Findings: Minimal enlargement of cardiac silhouette. Atherosclerotic calcification aorta. Pulmonary vascularity normal. Minimal atelectasis left base. Question underlying emphysematous changes. No acute infiltrate, pleural effusion or pneumothorax. Diffuse osseous demineralization.  IMPRESSION: Enlargement of cardiac silhouette. Question emphysematous changes with left base atelectasis.  Original Report Authenticated By: Lollie Marrow, M.D.    PHYSICAL EXAM General: Well developed, well nourished, in no acute distress Head: Eyes PERRLA, No xanthomas.   Normal cephalic and atramatic  Lungs: Upper airway congestion that clears with coughing. No rales. Heart: HRRR S1 S2, basilar 2/6 systolic murmur  Pulses are 2+ & equal.            No carotid bruit. No JVD.  No abdominal bruits. No femoral bruits. Abdomen: Bowel sounds are positive, abdomen soft and non-tender without masses or                  Hernia's noted. Msk: Deconditioned, Left hip staples and dressing. Some soreness. Extremities: No clubbing, cyanosis or edema.  DP +1; sequential compression stockings in place Neuro: Alert and oriented X 3. Psych:  Good affect, responds appropriately, but she is somewhat disoriented, unaware that her surgery was performed only 24 hours ago.  TELEMETRY: Reviewed telemetry:  Sinus tachycardia rate of 100 bpm.   No significant arrhythmias  ASSESSMENT AND PLAN:  1. CAD: S/P BMS to RCA ( x2) August of 2011. She has been on Effient, but it is not necessary  to restart her at this time. Would restart ASA 81 mg today.  Continue HR control with digoxin and metoprolol. Slightly elevated heart rate at this time, probably r/t acute post-operative state and post-operative pain.  2. PSVT: No evidence of this at present. Continue current medications.  3. Mild fluid overload:  BNP 1242.  Will not add diuretic at this time unless necessary.  I&Os are net negative for the patient's entire hospital stay, but urine output may not have been recorded accurately. There has been a net diuresis over the last 2 days as well. Patient has no evidence for fluid overload on exam, and I would not worry about that issue at present despite a mildly elevated proBNP level.  Bettey Mare. Lyman Bishop NP [facility] -------------------------------------------- Cardiology Attending  Patient interviewed and examined. Discussed with Joni Reining, NP.  Above note annotated and modified based upon my findings.  Physical exam is benign with  no JVD, a grade 1-2/6 basilar systolic ejection murmur and essentially clear lung fields.  Patient is tolerating by mouth intake well, but is anorectic. Platelet inhibitor can be restarted when there is absolutely no risk for bleeding early next week.  [place] Bing, MD

## 2011-04-26 NOTE — Consults (Signed)
Spoke with Pt today regarding D/C plan.  She had surgery yesterday and will require short-term rehab at D/C. Pt has been faxed out to area facilities and is awaiting bed offers. CSW will continue to follow and assist with D/C plan as  indicated.  Anticipated D/C Monday.

## 2011-04-26 NOTE — Progress Notes (Signed)
Physical Therapy Evaluation Patient Name: Katie Ford Date: 04/26/2011 Problem List:  Patient Active Problem List  Diagnoses  . Mitral regurgitation  . Paroxysmal supraventricular tachycardia  . Hypertension  . Asthmatic bronchitis  . Obesity  . GERD (gastroesophageal reflux disease)  . Edema  . CAD (coronary artery disease)  . Leukocytosis  . Hyponatremia  . Fracture of left hip  . HTN (hypertension)  . CAD (coronary artery disease)  . Fever  . Asthmatic bronchitis  . Anemia  . Hypothyroidism  . DM2 (diabetes mellitus, type 2)  . Fracture of rib of left side  . UTI (urinary tract infection)   Past Medical History:  Past Medical History  Diagnosis Date  . Ejection fraction < 50% 12/2005    EF 65 %... normal... catheterization 02/2010.  . Mitral regurgitation     mild, echo, June, 2007  . Paroxysmal supraventricular tachycardia     infrequent episodes over the years..palpitations.  . Hypertension   . Asthmatic bronchitis   . Obesity   . CAD (coronary artery disease)     bare metal stents to 2 separate RCA lesions August, 2011, residual 60% LAD, medical therapy  /  plan was for effient for one month followed by Plavix,, but patient does not tolerate Plavix... therefore patient placed back on effient  . GERD (gastroesophageal reflux disease)   . Edema 03/29/2010    September, 2011  . Intolerance of drug     aspirin and plavix  . Hypothyroidism 04/24/2011  . Hyperglycemia 04/24/2011  . Fracture of rib of left side 04/24/2011  . DM2 (diabetes mellitus, type 2) 04/24/2011   Past Surgical History:  Past Surgical History  Procedure Date  . Knee arthroscopy 11/2001    left knee  . Laparoscopic cholecystectomy 04/29/1996    [facility]  . Tubal ligation 09/1970  . [place] fracture [place] 12[place]    [place]  . [place] thumb[place]r* 05[place]  . [facility] abdominal hysterectomy 1983    [facility]  . Cardiac catheterization 02/22/2010    2 stents, [facility]  . Knee arthroscopy  02/19/2011    Procedure: ARTHROSCOPY KNEE;  Surgeon: Darreld Mclean;  Location: [facility];  Service: Orthopedics;  Laterality: Right;  Medial and Lateral Partial Menisectomy  . Arthroplasty w/ arthroscopy medial / lateral compartment knee     [place], should read arthroscopy not arthroplasty    Precautions/Restrictions  Precautions Precautions: Fall Restrictions Weight Bearing Restrictions: Yes LLE Weight Bearing: Touchdown weight bearing Prior Functioning  Home Living Type of Home: House Lives With: Sheran Spine Help From: Family Home Layout: One level Home Access: Level entry Entrance Stairs-Rails: None Entrance Stairs-Number of Steps: 0 Bathroom Shower/Tub: Health visitor: Standard Home Adaptive Equipment: Raised toilet seat with rails;Walker - four wheeled Prior Function Level of Independence: Independent with basic ADLs Driving: No Vocation: Retired Producer, television/film/video: Awake/alert Overall Cognitive Status: Appears within functional limits for tasks assessed Orientation Level: Oriented X4 Sensation/Coordination Coordination Gross Motor Movements are Fluid and Coordinated: Yes Extremity Assessment RLE Assessment RLE Assessment: Exceptions to [facility] RLE AROM (degrees) Overall AROM Right Lower Extremity: Deficits;Due to decreased strength RLE Strength RLE Overall Strength: Deficits LLE Assessment LLE Assessment: Exceptions to WFL LLE AROM (degrees) Overall AROM Left Lower Extremity: Deficits;Due to decreased strength;Due to pain LLE Strength LLE Overall Strength: Deficits;Due to pain Mobility (including Balance) Bed Mobility Bed Mobility: Yes Supine to Sit: 2: Max assist Sitting - Scoot to Edge of Bed: 3: Mod assist  Transfers Transfers: Yes Sit to Stand: 2: Max assist Stand to Sit: 3: Mod assist Stand Pivot Transfers: 2: Max assist Ambulation/Gait Ambulation/Gait: No Stairs: No Wheelchair Mobility Wheelchair Mobility: No   Balance Balance Assessed: No Exercise  Total Joint Exercises Ankle Circles/Pumps: AROM;Both;10 reps Quad Sets: AROM;Strengthening;10 reps Gluteal Sets: Strengthening;Both;10 reps Heel Slides: AAROM;Both;5 reps Hip ABduction/ADduction: AROM;10 reps;Both General Exercises - Lower Extremity Ankle Circles/Pumps: AROM;Both;10 reps Quad Sets: AROM;Strengthening;10 reps Gluteal Sets: Strengthening;Both;10 reps Heel Slides: AAROM;Both;5 reps Hip ABduction/ADduction: AROM;10 reps;Both Low Level/ICU Exercises Ankle Circles/Pumps: AROM;Both;10 reps Quad Sets: AROM;Strengthening;10 reps Hip ABduction/ADduction: AROM;10 reps;Both Heel Slides: AAROM;Both;5 reps  End of Session PT - End of Session Equipment Utilized During Treatment: Gait belt Activity Tolerance: Patient tolerated treatment well;Patient limited by pain Patient left: in bed;with call bell in reach Nurse Communication: Mobility status for transfers General Behavior During Session: [facility] for tasks performed Cognition: [facility] for tasks performed PT Assessment/Plan/Recommendation PT Assessment Clinical Impression Statement: Pt with decreased strength, Rom and balance who will need skilled therapy to improve functional mobilty. PT Recommendation/Assessment: Patient will need skilled PT in the acute care venue PT Problem List: Decreased strength;Decreased range of motion;Decreased balance PT Therapy Diagnosis : Difficulty walking;Generalized weakness;Acute pain PT Plan PT Frequency: Min 6X/week PT Treatment/Interventions: Gait training;Therapeutic exercise PT Recommendation Follow Up Recommendations: Skilled nursing facility Equipment Recommended: Defer to next venue PT Goals  Acute Rehab PT Goals PT Goal Formulation: With patient Time For Goal Achievement: 3 days Pt will go Supine/Side to Sit: with mod assist Pt will Sit at [facility] of Bed: with mod assist Pt will Transfer Sit to Stand/Stand to Sit: with mod assist Pt will  Stand: with mod assist;1 - 2 min Pt will Ambulate: 1 - 15 feet;with mod assist;with rolling walker RUSSELL,CINDY 04/26/2011, 12:40 PM

## 2011-04-26 NOTE — Progress Notes (Signed)
Physical Therapy Treatment Patient Name: Katie Ford [id number] Date: 04/26/2011  EAVW:[id number]/ [id number] GT 1 TA Problem List:  Patient Active Problem List  Diagnoses  . Mitral regurgitation  . Paroxysmal supraventricular tachycardia  . Hypertension  . Asthmatic bronchitis  . Obesity  . GERD (gastroesophageal reflux disease)  . Edema  . CAD (coronary artery disease)  . Leukocytosis  . Hyponatremia  . Fracture of left hip  . HTN (hypertension)  . CAD (coronary artery disease)  . Fever  . Asthmatic bronchitis  . Anemia  . Hypothyroidism  . DM2 (diabetes mellitus, type 2)  . Fracture of rib of left side  . UTI (urinary tract infection)   Past Medical History:  Past Medical History  Diagnosis Date  . Ejection fraction < 50% 12/2005    EF 65 %... normal... catheterization 02/2010.  . Mitral regurgitation     mild, echo, June, 2007  . Paroxysmal supraventricular tachycardia     infrequent episodes over the years..palpitations.  . Hypertension   . Asthmatic bronchitis   . Obesity   . CAD (coronary artery disease)     bare metal stents to 2 separate RCA lesions August, 2011, residual 60% LAD, medical therapy  /  plan was for effient for one month followed by Plavix,, but patient does not tolerate Plavix... therefore patient placed back on effient  . GERD (gastroesophageal reflux disease)   . Edema 03/29/2010    September, 2011  . Intolerance of drug     aspirin and plavix  . Hypothyroidism 04/24/2011  . Hyperglycemia 04/24/2011  . Fracture of rib of left side 04/24/2011  . DM2 (diabetes mellitus, type 2) 04/24/2011   Past Surgical History:  Past Surgical History  Procedure Date  . Knee arthroscopy 11/2001    left knee  . Laparoscopic cholecystectomy 04/29/1996    [facility]  . Tubal ligation 09/1970  . [place]le fracture surgery 12/1993    [place]  . Sp arthro thumb*r* 05/1989    [place]  . Total abdominal hysterectomy 1983    [facility]  . Cardiac catheterization 02/22/2010    2 stents,  [facility]  . Knee arthroscopy 02/19/2011    Procedure: ARTHROSCOPY KNEE;  Surgeon: Darreld Mclean;  Location: [facility];  Service: Orthopedics;  Laterality: Right;  Medial and Lateral Partial Menisectomy  . Arthroplasty w/ arthroscopy medial / lateral compartment knee     [facility], should read arthroscopy not arthroplasty   Precautions/Restrictions  Precautions Precautions: Fall Restrictions Weight Bearing Restrictions: Yes LLE Weight Bearing: Touchdown weight bearing Mobility (including Balance) Bed Mobility Bed Mobility: Yes Supine to Sit: 2: Max assist Sitting - Scoot to Edge of Bed: 3: Mod assist Sit to Supine - Right: 2: Max assist Sit to Supine - Right Details (indicate cue type and reason): assistance with LE and lateral as well as scoot to HOB +2 Transfers Transfers: Yes Sit to Stand: 2: Max assist Sit to Stand Details (indicate cue type and reason): vc's for hand placement and getting trunk over LEs and use of UE to redistribute weight from  RLE Stand to Sit: 3: Mod assist Stand to Sit Details: controlling descent and hand placement vcs Stand Pivot Transfers: 2: Max assist Ambulation/Gait Ambulation/Gait: Yes Ambulation/Gait Assistance: 2: Max assist Ambulation/Gait Assistance Details (indicate cue type and reason): RW;VCs for use of UE to distribute weight off of RLE Ambulation Distance (Feet): 3 Feet Assistive device: Rolling walker Gait Pattern: Trunk flexed Gait velocity: extremely slow Stairs: No Wheelchair Mobility Wheelchair Mobility: No  Balance Balance Assessed: No Exercise  Total Joint Exercises Ankle Circles/Pumps: AROM;Both;10 reps Quad Sets: AROM;Strengthening;10 reps Gluteal Sets: Strengthening;Both;10 reps Heel Slides: AAROM;Both;5 reps Hip ABduction/ADduction: AROM;10 reps;Both General Exercises - Lower Extremity Ankle Circles/Pumps: AROM;Both;10 reps Quad Sets: AROM;Strengthening;10 reps Gluteal Sets: Strengthening;Both;10 reps Heel Slides: AAROM;Both;5  reps Hip ABduction/ADduction: AROM;10 reps;Both Low Level/ICU Exercises Ankle Circles/Pumps: AROM;Both;10 reps Quad Sets: AROM;Strengthening;10 reps Hip ABduction/ADduction: AROM;10 reps;Both Heel Slides: AAROM;Both;5 reps  End of Session PT - End of Session Equipment Utilized During Treatment: Gait belt (RW) Activity Tolerance: Patient limited by pain Patient left: in bed (with nursing and family present) Nurse Communication: Mobility status for transfers General Behavior During Session: [facility] for tasks performed Cognition: [facility] for tasks performed PT Assessment/Plan  PT - Assessment/Plan Comments on Treatment Session: nursing called for assistance with pt due to bowel movement in chair and assistance needed to get pt back to bed;pt was instructed and trained in gait, RW technique to distribute weight  with  UEs and for bed mobility; Pt is  Mod A to Max A +2 for all  mobility PT Frequency: Min 6X/week Follow Up Recommendations: Skilled nursing facility Equipment Recommended: Defer to next venue PT Goals  Acute Rehab PT Goals PT Goal Formulation: With patient Time For Goal Achievement: 3 days Pt will go Supine/Side to Sit: with mod assist Pt will Sit at [facility] of Bed: with mod assist Pt will Transfer Sit to Stand/Stand to Sit: with mod assist Pt will Stand: with mod assist;1 - 2 min Pt will Ambulate: 1 - 15 feet;with mod assist;with rolling walker  Talaya Lamprecht ATKINSO 04/26/2011, 1:39 PM

## 2011-04-27 LAB — DIFFERENTIAL
Lymphs Abs: 1.6 10*3/uL (ref 0.7–4.0)
Monocytes Absolute: 2.6 10*3/uL — ABNORMAL HIGH (ref 0.1–1.0)
Monocytes Relative: 17 % — ABNORMAL HIGH (ref 3–12)
Neutro Abs: 11.1 10*3/uL — ABNORMAL HIGH (ref 1.7–7.7)
Neutrophils Relative %: 72 % (ref 43–77)

## 2011-04-27 LAB — CBC
HCT: 29.4 % — ABNORMAL LOW (ref 36.0–46.0)
Hemoglobin: 9.5 g/dL — ABNORMAL LOW (ref 12.0–15.0)
RBC: 3.4 MIL/uL — ABNORMAL LOW (ref 3.87–5.11)

## 2011-04-27 LAB — BASIC METABOLIC PANEL
BUN: 12 mg/dL (ref 6–23)
CO2: 30 mEq/L (ref 19–32)
Chloride: 92 mEq/L — ABNORMAL LOW (ref 96–112)
Creatinine, Ser: 0.73 mg/dL (ref 0.50–1.10)
Glucose, Bld: 147 mg/dL — ABNORMAL HIGH (ref 70–99)
Potassium: 4 mEq/L (ref 3.5–5.1)

## 2011-04-27 LAB — GLUCOSE, CAPILLARY: Glucose-Capillary: 133 mg/dL — ABNORMAL HIGH (ref 70–99)

## 2011-04-27 MED ORDER — MENTHOL 3 MG MT LOZG
1.0000 | LOZENGE | OROMUCOSAL | Status: DC | PRN
  Filled 2011-04-27: qty 9

## 2011-04-27 NOTE — Progress Notes (Signed)
Physical Therapy Treatment Patient Name: Katie Ford [id number] Date: 04/27/2011 Problem List:  Patient Active Problem List  Diagnoses  . Mitral regurgitation  . Paroxysmal supraventricular tachycardia  . Hypertension  . Asthmatic bronchitis  . Obesity  . GERD (gastroesophageal reflux disease)  . Edema  . CAD (coronary artery disease)  . Leukocytosis  . Hyponatremia  . Fracture of left hip  . HTN (hypertension)  . CAD (coronary artery disease)  . Fever  . Asthmatic bronchitis  . Anemia  . Hypothyroidism  . DM2 (diabetes mellitus, type 2)  . Fracture of rib of left side  . UTI (urinary tract infection)   Past Medical History:  Past Medical History  Diagnosis Date  . Ejection fraction < 50% 12/2005    EF 65 %... normal... catheterization 02/2010.  . Mitral regurgitation     mild, echo, June, 2007  . Paroxysmal supraventricular tachycardia     infrequent episodes over the years..palpitations.  . Hypertension   . Asthmatic bronchitis   . Obesity   . CAD (coronary artery disease)     bare metal stents to 2 separate RCA lesions August, 2011, residual 60% LAD, medical therapy  /  plan was for effient for one month followed by Plavix,, but patient does not tolerate Plavix... therefore patient placed back on effient  . GERD (gastroesophageal reflux disease)   . Edema 03/29/2010    September, 2011  . Intolerance of drug     aspirin and plavix  . Hypothyroidism 04/24/2011  . Hyperglycemia 04/24/2011  . Fracture of rib of left side 04/24/2011  . DM2 (diabetes mellitus, type 2) 04/24/2011   Past Surgical History:  Past Surgical History  Procedure Date  . Knee arthroscopy 11/2001    left knee  . Laparoscopic cholecystectomy 04/29/1996    [facility]  . Tubal ligation 09/1970  . [place]le fracture [place] 12/1993    [place]  . Sp arthro thumb*r* 05/1989    [place]  . Total abdominal hysterectomy 1983    [facility]  . Cardiac catheterization 02/22/2010    2 stents, [facility]  . Knee arthroscopy  02/19/2011    Procedure: ARTHROSCOPY KNEE;  Surgeon: Darreld Mclean;  Location: [facility];  Service: Orthopedics;  Laterality: Right;  Medial and Lateral Partial Menisectomy  . Arthroplasty w/ arthroscopy medial / lateral compartment knee     [facility], should read arthroscopy not arthroplasty   Precautions/Restrictions  Precautions Precautions: Fall Restrictions Weight Bearing Restrictions: Yes LLE Weight Bearing: Touchdown weight bearing Mobility (including Balance) Bed Mobility Supine to Sit: 2: Max assist;HOB elevated (Comment degrees) (HOB at 60 deg) Sitting - Scoot to Edge of Bed: 3: Mod assist Sitting - Scoot to Edge of Bed Details (indicate cue type and reason): instructed in using UE's to assist Transfers Sit to Stand: 2: Max assist;With armrests;From chair/3-in-1;From bed;With upper extremity assist Sit to Stand Details (indicate cue type and reason): Has difficulty flexing R knee enough in order to get good push off Stand to Sit: 3: Mod assist Stand to Sit Details: cues for correct hand placement Stand Pivot Transfers: 2: Max assist Stand Pivot Transfer Details (indicate cue type and reason): instructed in trnasfer bed to bsc Ambulation/Gait Ambulation/Gait: No Ambulation/Gait Assistance Details (indicate cue type and reason): unable to take any functional step with walker    Exercise  Total Joint Exercises Ankle Circles/Pumps: AROM;Both;10 reps;Supine Quad Sets: AROM;Both;10 reps;Supine Gluteal Sets: AROM;Both;10 reps;Supine Short Arc Quad: AAROM;Both;10 reps;Supine Heel Slides: AAROM;Both;10 reps;Supine;Other (comment) (only able to get 60  deg.knee flextion R) Hip ABduction/ADduction: AAROM;Both;10 reps;Supine General Exercises - Lower Extremity Ankle Circles/Pumps: AROM;Both;10 reps;Supine Quad Sets: AROM;Both;10 reps;Supine Gluteal Sets: AROM;Both;10 reps;Supine Short Arc Quad: AAROM;Both;10 reps;Supine Heel Slides: AAROM;Both;10 reps;Supine;Other (comment) (only able to  get 60 deg.knee flextion R) Hip ABduction/ADduction: AAROM;Both;10 reps;Supine Low Level/ICU Exercises Ankle Circles/Pumps: AROM;Both;10 reps;Supine Quad Sets: AROM;Both;10 reps;Supine Short Arc Quad: AAROM;Both;10 reps;Supine Hip ABduction/ADduction: AAROM;Both;10 reps;Supine Heel Slides: AAROM;Both;10 reps;Supine;Other (comment) (only able to get 60 deg.knee flextion R)  End of Session PT - End of Session Equipment Utilized During Treatment: Gait belt Activity Tolerance: Patient tolerated treatment well Patient left: in chair;with call bell in reach;with family/visitor present Nurse Communication: Mobility status for transfers General Behavior During Session: [facility] for tasks performed Cognition: [facility] for tasks performed PT Assessment/Plan  PT - Assessment/Plan Comments on Treatment Session: needed encouragement to get OOB-- limited knee flexion on R will limit progress (had arthroscopic surgery 2 mos ago per pt)--will need to work on developing better flex in rehab PT Plan: Discharge plan remains appropriate Follow Up Recommendations: Skilled nursing facility PT Goals  Acute Rehab PT Goals PT Goal: Supine/Side to Sit - Progress: Progressing toward goal PT Goal: Sit at Edge Of Bed - Progress: Progressing toward goal PT Transfer Goal: Sit to Stand/Stand to Sit - Progress: Progressing toward goal PT Goal: Stand - Progress: Progressing toward goal PT Goal: Ambulate - Progress: Progressing toward goal  Konrad Penta 04/27/2011, 11:45 AM

## 2011-04-27 NOTE — Progress Notes (Signed)
Chart reviewed  Subjective: Complains of hoarseness. Requesting throat lozenges. No chest pain  Objective: Vital signs in last 24 hours: Filed Vitals:   04/27/11 0512 04/27/11 0800 04/27/11 0944 04/27/11 1200  BP: 132/72     Pulse: 81  82   Temp: 99.6 F (37.6 C)     TempSrc: Oral     Resp: 20 16 18 18   Height:      Weight:      SpO2: 92% 95% 98% 97%   Weight change:   Intake/Output Summary (Last 24 hours) at 04/27/11 1431 Last data filed at 04/27/11 0512  Gross per 24 hour  Intake    480 ml  Output   2090 ml  Net  -1610 ml   General: Comfortable, alert and oriented. HEENT: No thrush Lungs clear to auscultation bilaterally without wheeze rhonchi or rales Cardiovascular regular rate rhythm without murmurs gallops rubs Musculoskeletal: She does have tenderness under her left breast no bruising or deformity. Abdomen soft nontender nondistended Extremities sequential compression devices are in place. No edema.  Lab Results: Basic Metabolic Panel:  Lab 04/27/11 1324 04/26/11 0450  NA 129* 131*  K 4.0 3.8  CL 92* 94*  CO2 30 30  GLUCOSE 147* 160*  BUN 12 15  CREATININE 0.73 0.79  CALCIUM 8.7 8.8  MG -- --  PHOS -- --   Liver Function Tests:  Lab 04/25/11 0425 04/20/11 1720  AST 15 28  ALT 13 22  ALKPHOS 88 105  BILITOT 0.4 0.2*  PROT 6.6 7.2  ALBUMIN 2.2* 3.5   No results found for this basename: LIPASE:2,AMYLASE:2 in the last 168 hours No results found for this basename: AMMONIA:2 in the last 168 hours CBC:  Lab 04/27/11 0734 04/26/11 0450  WBC 15.4* 14.4*  NEUTROABS 11.1* 10.4*  HGB 9.5* 10.6*  HCT 29.4* 33.2*  MCV 86.5 88.3  PLT 281 280   Cardiac Enzymes:  Lab 04/24/11 0757  CKTOTAL 87  CKMB 2.0  CKMBINDEX --  TROPONINI <0.30   BNP:  Lab 04/24/11 0755  POCBNP 1242.0*   D-Dimer: No results found for this basename: DDIMER:2 in the last 168 hours CBG:  Lab 04/27/11 0720 04/26/11 2135 04/26/11 1708  GLUCAP 133* 153* 190*    Hemoglobin A1C:  Lab 04/25/11 0425  HGBA1C 6.6*   Fasting Lipid Panel: No results found for this basename: CHOL,HDL,LDLCALC,TRIG,CHOLHDL,LDLDIRECT in the last 401 hours Thyroid Function Tests:  Lab 04/24/11 0758  TSH 2.445  T4TOTAL --  FREET4 1.36  T3FREE --  THYROIDAB --   Anemia Panel: No results found for this basename: VITAMINB12,FOLATE,FERRITIN,TIBC,IRON,RETICCTPCT in the last 168 hours  Alcohol Level: No results found for this basename: ETH:2 in the last 168 hours Urinalysis: Specific gravity 1.015 pH 7.0 glucose 250 protein 30 nitrates positive moderate leukocyte esterase too numerous to count white cells 3-6 red cells few squamous epithelial cells many bacteria urine culture growing greater than 100,000 colonies of gram-negative rods  Micro Results: Recent Results (from the past 240 hour(s))  URINE CULTURE     Status: Normal   Collection Time   04/20/11  5:37 PM      Component Value Range Status Comment   Specimen Description URINE, CATHETERIZED   Final    Special Requests NONE   Final    Setup Time [id number]   Final    Colony Count NO GROWTH   Final    Culture NO GROWTH   Final    Report Status 04/22/2011 FINAL  Final   SURGICAL PCR SCREEN     Status: Normal   Collection Time   04/21/11  9:21 PM      Component Value Range Status Comment   MRSA, PCR NEGATIVE  NEGATIVE  Final    Staphylococcus aureus NEGATIVE  NEGATIVE  Final   URINE CULTURE     Status: Normal   Collection Time   04/24/11  9:27 AM      Component Value Range Status Comment   Specimen Description URINE, CATHETERIZED   Final    Special Requests NONE   Final    Setup Time [id number]   Final    Colony Count >=100,000 COLONIES/ML   Final    Culture ESCHERICHIA COLI   Final    Report Status 04/26/2011 FINAL   Final    Organism ID, Bacteria ESCHERICHIA COLI   Final    Studies/Results: Dg Chest Portable 1 View  04/26/2011  *RADIOLOGY REPORT*  Clinical Data: PICC line replacement  PORTABLE  CHEST - 1 VIEW  Comparison: Portable exam 1552 hours compared to 04/24/2011  Findings: Left arm PICC line, tip projects over SVC. Minimal enlargement of cardiac silhouette. Calcified tortuous aorta. Pulmonary vascularity normal. Minimal bibasilar atelectasis. No gross effusion or pneumothorax. Bones appear demineralized.  IMPRESSION: Tip of left arm PICC line projects over the SVC. Minimal bibasilar atelectasis.  Original Report Authenticated By: Lollie Marrow, M.D.   Medications: I have reviewed the patient's current medications. Scheduled Meds:    . albuterol  2.5 mg Nebulization Q6H  . amLODipine  10 mg Oral Daily  . antiseptic oral rinse   Mouth Rinse BID  . aspirin  81 mg Oral Daily  . CALCIUM-MAGNESUIUM-ZINC  1 tablet Oral Daily  . calcium-vitamin D  1 tablet Oral Daily  . ceFAZolin (ANCEF) IV  1 g Intravenous Once  . ceFAZolin (ANCEF) IV  1 g Intravenous Once  . cefTRIAXone (ROCEPHIN) IV  1 g Intravenous Q24H  . digoxin  250 mcg Oral Daily  . enoxaparin  30 mg Subcutaneous Q12H  . fluticasone  1 puff Inhalation BID  . guaiFENesin  1,200 mg Oral Daily  . HYDROmorphone PCA 0.3 mg/mL   Intravenous Q4H  . levothyroxine  50 mcg Oral Daily  . loratadine  10 mg Oral Daily  . metFORMIN  500 mg Oral Q breakfast  . metoprolol  50-100 mg Oral BID  . pantoprazole  40 mg Oral BID AC  . polyethylene glycol  17 g Oral Daily  . Polyvinyl Alcohol-Povidone  1 drop Both Eyes UD  . potassium chloride  20 mEq Oral BID  . povidone-iodine   Topical Once  . rosuvastatin  10 mg Oral q1800  . sodium chloride  10 mL Intracatheter Q12H  . sodium chloride  10 mL Intracatheter Q12H  . zolpidem  5 mg Oral QHS   Continuous Infusions:  PRN Meds:.acetaminophen, acetaminophen, acetaminophen, albuterol, ALPRAZolam, alum & mag hydroxide-simeth, bisacodyl, diphenhydrAMINE, diphenhydrAMINE, HYDROmorphone, HYDROmorphone, magnesium hydroxide, menthol-cetylpyridinium, naloxone, nitroGLYCERIN, ondansetron (ZOFRAN)  IV, ondansetron, promethazine, senna, sodium chloride, sodium chloride, sodium chloride, sodium phosphate Assessment/Plan: Active Problems:  UTI (urinary tract infection)  Leukocytosis  Hyponatremia  Fracture of left hip  HTN (hypertension)  CAD (coronary artery disease)  Fever  Asthmatic bronchitis  Anemia  Hypothyroidism  DM2 (diabetes mellitus, type 2)  Fracture of rib of left side  Continue ceftriaxone.   LOS: 7 days   Darran Gabay L 04/27/2011, 2:31 PM

## 2011-04-27 NOTE — Patient Instructions (Signed)
Pt instructed in weight bearing precautions RLE

## 2011-04-27 NOTE — Progress Notes (Signed)
Subjective: 2 Days Post-Op Procedure(s) (LRB): OPEN REDUCTION INTERNAL FIXATION HIP (Left) Patient reports pain as 3 on 0-10 scale.    Objective: Vital signs in last 24 hours: Temp:  [99.5 F (37.5 C)-101.9 F (38.8 C)] 99.6 F (37.6 C) (10/06 0512) Pulse Rate:  [81-101] 81  (10/06 0512) Resp:  [16-32] 16  (10/06 0800) BP: (116-151)/(66-80) 132/72 mmHg (10/06 0512) SpO2:  [92 %-99 %] 95 % (10/06 0800)  Intake/Output from previous day: 10/05 0701 - 10/06 0700 In: 480 [P.O.:480] Out: 2090 [Urine:2050; Drains:40] Intake/Output this shift:     Basename 04/27/11 0734 04/26/11 0450 04/25/11 0425  HGB 9.5* 10.6* 10.7*    Basename 04/27/11 0734 04/26/11 0450  WBC 15.4* 14.4*  RBC 3.40* 3.76*  HCT 29.4* 33.2*  PLT 281 280    Basename 04/27/11 0734 04/26/11 0450  NA 129* 131*  K 4.0 3.8  CL 92* 94*  CO2 30 30  BUN 12 15  CREATININE 0.73 0.79  GLUCOSE 147* 160*  CALCIUM 8.7 8.8   No results found for this basename: LABPT:2,INR:2 in the last 72 hours  Neurologically intact.  Her foley and hemovac have been removed.    She did well in physical therapy.  Plan to continue.  Wound looks good.  She has a urinary tract infection, E. Coli, and is being treated now.  She had another spike of temperature last night to 101.  She had less "sundowning" last night and was not as disoriented as she has been.  Her new PICC line is working well.  I will transfuse one unit of RBCs today as HGB is 9.5.  Her sodium and chloride remain slightly decreased.  Vital signs otherwise stable.  Assessment/Plan: 2 Days Post-Op Procedure(s) (LRB): OPEN REDUCTION INTERNAL FIXATION HIP (Left) Up with therapy and transfuse one unit today.  Ulrich Soules 04/27/2011, 9:18 AM

## 2011-04-28 LAB — TYPE AND SCREEN
ABO/RH(D): O POS
Antibody Screen: NEGATIVE
Unit division: 0

## 2011-04-28 LAB — DIFFERENTIAL
Basophils Absolute: 0.1 10*3/uL (ref 0.0–0.1)
Basophils Relative: 0 % (ref 0–1)
Monocytes Absolute: 2.1 10*3/uL — ABNORMAL HIGH (ref 0.1–1.0)
Neutro Abs: 10.4 10*3/uL — ABNORMAL HIGH (ref 1.7–7.7)
Neutrophils Relative %: 71 % (ref 43–77)

## 2011-04-28 LAB — CBC
HCT: 31.8 % — ABNORMAL LOW (ref 36.0–46.0)
MCHC: 33 g/dL (ref 30.0–36.0)
Platelets: 318 10*3/uL (ref 150–400)
RDW: 13.8 % (ref 11.5–15.5)

## 2011-04-28 MED ORDER — HYDROMORPHONE 0.3 MG/ML IV SOLN
INTRAVENOUS | Status: AC
  Administered 2011-04-28: 09:00:00
  Filled 2011-04-28: qty 25

## 2011-04-28 NOTE — Plan of Care (Signed)
Problem: Phase III Progression Outcomes Goal: Discharge plan remains appropriate-arrangements made Outcome: Completed/Met Date Met:  04/28/11 D/C to [facility] tomorrow for rehab

## 2011-04-28 NOTE — Progress Notes (Signed)
Subjective: 3 Days Post-Op Procedure(s) (LRB): OPEN REDUCTION INTERNAL FIXATION HIP (Left) Patient reports pain as 2 on 0-10 scale.    Objective: Vital signs in last 24 hours: Temp:  [98.1 F (36.7 C)-100.9 F (38.3 C)] 98.2 F (36.8 C) (10/07 1042) Pulse Rate:  [72-93] 91  (10/07 1042) Resp:  [16-20] 18  (10/07 1042) BP: (105-140)/(61-82) 119/73 mmHg (10/07 1042) SpO2:  [90 %-98 %] 96 % (10/07 1042)  Intake/Output from previous day: 10/06 0701 - 10/07 0700 In: 250 [I.V.:250] Out: -  Intake/Output this shift: Total I/O In: 240 [P.O.:240] Out: -    Basename 04/28/11 0548 04/27/11 0734 04/26/11 0450  HGB 10.5* 9.5* 10.6*    Basename 04/28/11 0548 04/27/11 0734  WBC 14.6* 15.4*  RBC 3.66* 3.40*  HCT 31.8* 29.4*  PLT 318 281    Basename 04/27/11 0734 04/26/11 0450  NA 129* 131*  K 4.0 3.8  CL 92* 94*  CO2 30 30  BUN 12 15  CREATININE 0.73 0.79  GLUCOSE 147* 160*  CALCIUM 8.7 8.8   No results found for this basename: LABPT:2,INR:2 in the last 72 hours  Neurologically intact Neurovascular intact Sensation intact distally Intact pulses distally Incision: no drainage No cellulitis present  She is still being treated for the UTI.  She is going very slowly with PT doing only mild pivots.  She needs to progress further with this.  She has little pain and is sleeping well.  HGB increased after transfusion which was tolerated well.    Will work on post hospital plans tomorrow.  Assessment/Plan: 3 Days Post-Op Procedure(s) (LRB): OPEN REDUCTION INTERNAL FIXATION HIP (Left)   Judeen Geralds 04/28/2011, 1:08 PM

## 2011-04-28 NOTE — Progress Notes (Signed)
Subjective: No new complaints.  Objective: Vital signs in last 24 hours: Filed Vitals:   04/28/11 1042 04/28/11 1200 04/28/11 1317 04/28/11 1428  BP: 119/73   117/63  Pulse: 91  86 82  Temp: 98.2 F (36.8 C)   98.7 F (37.1 C)  TempSrc: Oral   Oral  Resp: 18 18 18 18   Height:      Weight:      SpO2: 96% 97% 99% 94%   Weight change:   Intake/Output Summary (Last 24 hours) at 04/28/11 1700 Last data filed at 04/28/11 1600  Gross per 24 hour  Intake    681 ml  Output      0 ml  Net    681 ml   General: Comfortable, alert and oriented. HEENT: No thrush. Voice less hoarse today. Lungs clear to auscultation bilaterally without wheeze rhonchi or rales Cardiovascular regular rate rhythm without murmurs gallops rubs Musculoskeletal: She does have tenderness under her left breast no bruising or deformity. Abdomen soft nontender nondistended Extremities sequential compression devices are in place. No edema.  Lab Results: Basic Metabolic Panel:  Lab 04/27/11 1610 04/26/11 0450  NA 129* 131*  K 4.0 3.8  CL 92* 94*  CO2 30 30  GLUCOSE 147* 160*  BUN 12 15  CREATININE 0.73 0.79  CALCIUM 8.7 8.8  MG -- --  PHOS -- --   Liver Function Tests:  Lab 04/25/11 0425  AST 15  ALT 13  ALKPHOS 88  BILITOT 0.4  PROT 6.6  ALBUMIN 2.2*   No results found for this basename: LIPASE:2,AMYLASE:2 in the last 168 hours No results found for this basename: AMMONIA:2 in the last 168 hours CBC:  Lab 04/28/11 0548 04/27/11 0734  WBC 14.6* 15.4*  NEUTROABS 10.4* 11.1*  HGB 10.5* 9.5*  HCT 31.8* 29.4*  MCV 86.9 86.5  PLT 318 281   Cardiac Enzymes:  Lab 04/24/11 0757  CKTOTAL 87  CKMB 2.0  CKMBINDEX --  TROPONINI <0.30   BNP:  Lab 04/24/11 0755  POCBNP 1242.0*   D-Dimer: No results found for this basename: DDIMER:2 in the last 168 hours CBG:  Lab 04/27/11 0720 04/26/11 2135 04/26/11 1708  GLUCAP 133* 153* 190*   Hemoglobin A1C:  Lab 04/25/11 0425  HGBA1C 6.6*     Fasting Lipid Panel: No results found for this basename: CHOL,HDL,LDLCALC,TRIG,CHOLHDL,LDLDIRECT in the last 960 hours Thyroid Function Tests:  Lab 04/24/11 0758  TSH 2.445  T4TOTAL --  FREET4 1.36  T3FREE --  THYROIDAB --   Anemia Panel: No results found for this basename: VITAMINB12,FOLATE,FERRITIN,TIBC,IRON,RETICCTPCT in the last 168 hours  Alcohol Level: No results found for this basename: ETH:2 in the last 168 hours Urinalysis: Specific gravity 1.015 pH 7.0 glucose 250 protein 30 nitrates positive moderate leukocyte esterase too numerous to count white cells 3-6 red cells few squamous epithelial cells many bacteria urine culture growing greater than 100,000 colonies of gram-negative rods  Micro Results: Recent Results (from the past 240 hour(s))  URINE CULTURE     Status: Normal   Collection Time   04/20/11  5:37 PM      Component Value Range Status Comment   Specimen Description URINE, CATHETERIZED   Final    Special Requests NONE   Final    Setup Time [id number]   Final    Colony Count NO GROWTH   Final    Culture NO GROWTH   Final    Report Status 04/22/2011 FINAL   Final  SURGICAL PCR SCREEN     Status: Normal   Collection Time   04/21/11  9:21 PM      Component Value Range Status Comment   MRSA, PCR NEGATIVE  NEGATIVE  Final    Staphylococcus aureus NEGATIVE  NEGATIVE  Final   URINE CULTURE     Status: Normal   Collection Time   04/24/11  9:27 AM      Component Value Range Status Comment   Specimen Description URINE, CATHETERIZED   Final    Special Requests NONE   Final    Setup Time [id number]   Final    Colony Count >=100,000 COLONIES/ML   Final    Culture ESCHERICHIA COLI   Final    Report Status 04/26/2011 FINAL   Final    Organism ID, Bacteria ESCHERICHIA COLI   Final    Studies/Results: No results found. Medications: I have reviewed the patient's current medications. Scheduled Meds:    . albuterol  2.5 mg Nebulization Q6H  . amLODipine  10  mg Oral Daily  . antiseptic oral rinse   Mouth Rinse BID  . aspirin  81 mg Oral Daily  . CALCIUM-MAGNESUIUM-ZINC  1 tablet Oral Daily  . calcium-vitamin D  1 tablet Oral Daily  . ceFAZolin (ANCEF) IV  1 g Intravenous Once  . ceFAZolin (ANCEF) IV  1 g Intravenous Once  . cefTRIAXone (ROCEPHIN) IV  1 g Intravenous Q24H  . digoxin  250 mcg Oral Daily  . enoxaparin  30 mg Subcutaneous Q12H  . fluticasone  1 puff Inhalation BID  . guaiFENesin  1,200 mg Oral Daily  . HYDROmorphone PCA 0.3 mg/mL   Intravenous Q4H  . HYDROmorphone PCA 0.3 mg/mL      . levothyroxine  50 mcg Oral Daily  . loratadine  10 mg Oral Daily  . metFORMIN  500 mg Oral Q breakfast  . metoprolol  50-100 mg Oral BID  . pantoprazole  40 mg Oral BID AC  . polyethylene glycol  17 g Oral Daily  . Polyvinyl Alcohol-Povidone  1 drop Both Eyes UD  . povidone-iodine   Topical Once  . rosuvastatin  10 mg Oral q1800  . sodium chloride  10 mL Intracatheter Q12H  . sodium chloride  10 mL Intracatheter Q12H  . zolpidem  5 mg Oral QHS   Continuous Infusions:  PRN Meds:.acetaminophen, acetaminophen, acetaminophen, albuterol, ALPRAZolam, alum & mag hydroxide-simeth, bisacodyl, diphenhydrAMINE, diphenhydrAMINE, HYDROmorphone, HYDROmorphone, magnesium hydroxide, menthol-cetylpyridinium, naloxone, nitroGLYCERIN, ondansetron (ZOFRAN) IV, ondansetron, promethazine, senna, sodium chloride, sodium chloride, sodium chloride, sodium phosphate Assessment/Plan: Active Problems:  UTI (urinary tract infection)  Leukocytosis  Hyponatremia  Fracture of left hip  HTN (hypertension)  CAD (coronary artery disease)  Fever  Asthmatic bronchitis  Anemia  Hypothyroidism  DM2 (diabetes mellitus, type 2)  Fracture of rib of left side  No fevers for over 24 hours. Continue ceftriaxone. Check labs in the morning.   LOS: 8 days   Katie Ford 04/28/2011, 5:00 PM

## 2011-04-29 LAB — BASIC METABOLIC PANEL
Chloride: 93 mEq/L — ABNORMAL LOW (ref 96–112)
Creatinine, Ser: 0.63 mg/dL (ref 0.50–1.10)
GFR calc Af Amer: >90 mL/min (ref >90)
Sodium: 129 mEq/L — ABNORMAL LOW (ref 135–145)

## 2011-04-29 LAB — CBC
HCT: 31 % — ABNORMAL LOW (ref 36.0–46.0)
Platelets: 341 10*3/uL (ref 150–400)
RDW: 13.8 % (ref 11.5–15.5)
WBC: 14.4 10*3/uL — ABNORMAL HIGH (ref 4.0–10.5)

## 2011-04-29 MED ORDER — CEPHALEXIN 500 MG PO CAPS
500.0000 mg | ORAL_CAPSULE | Freq: Two times a day (BID) | ORAL | Status: DC
  Administered 2011-04-29 – 2011-04-30 (×3): 500 mg via ORAL
  Filled 2011-04-29 (×3): qty 1

## 2011-04-29 MED ORDER — TRAMADOL HCL 50 MG PO TABS: 50.0000 mg | ORAL_TABLET | Freq: Four times a day (QID) | ORAL | Status: DC | PRN

## 2011-04-29 MED ORDER — HYDROMORPHONE 0.3 MG/ML IV SOLN
INTRAVENOUS | Status: AC
  Filled 2011-04-29: qty 25

## 2011-04-29 MED ORDER — DEXTROSE-NACL 5-0.45 % IV SOLN
INTRAVENOUS | Status: DC
  Administered 2011-04-29: 08:00:00 via INTRAVENOUS

## 2011-04-29 NOTE — Progress Notes (Signed)
Physical Therapy Treatment Patient Name: Katie Ford Date: 04/29/2011  TIME: [phone]/ 1 TE 1 TA Problem List:  Patient Active Problem List  Diagnoses  . Mitral regurgitation  . Paroxysmal supraventricular tachycardia  . Hypertension  . Asthmatic bronchitis  . Obesity  . GERD (gastroesophageal reflux disease)  . Edema  . CAD (coronary artery disease)  . Leukocytosis  . Hyponatremia  . Fracture of left hip  . HTN (hypertension)  . CAD (coronary artery disease)  . Asthmatic bronchitis  . Anemia  . Hypothyroidism  . DM2 (diabetes mellitus, type 2)  . Fracture of rib of left side  . UTI (urinary tract infection)   Past Medical History:  Past Medical History  Diagnosis Date  . Ejection fraction < 50% 12/2005    EF 65 %... normal... catheterization 02/2010.  . Mitral regurgitation     mild, echo, June, 2007  . Paroxysmal supraventricular tachycardia     infrequent episodes over the years..palpitations.  . Hypertension   . Asthmatic bronchitis   . Obesity   . CAD (coronary artery disease)     bare metal stents to 2 separate RCA lesions August, 2011, residual 60% LAD, medical therapy  /  plan was for effient for one month followed by Plavix,, but patient does not tolerate Plavix... therefore patient placed back on effient  . GERD (gastroesophageal reflux disease)   . Edema 03/29/2010    September, 2011  . Intolerance of drug     aspirin and plavix  . Hypothyroidism 04/24/2011  . Hyperglycemia 04/24/2011  . Fracture of rib of left side 04/24/2011  . DM2 (diabetes mellitus, type 2) 04/24/2011   Past Surgical History:  Past Surgical History  Procedure Date  . Knee arthroscopy 11/2001    left knee  . Laparoscopic cholecystectomy 04/29/1996    [facility]  . Tubal ligation 09/1970  . [place]le fracture surgery 12/1993    [place]  . Sp arthro thumb*r* 05/1989    [place]  . Total abdominal hysterectomy 1983    [facility]  . Cardiac catheterization 02/22/2010    2 stents, [facility]  .  Knee arthroscopy 02/19/2011    Procedure: ARTHROSCOPY KNEE;  Surgeon: Darreld Mclean;  Location: [facility];  Service: Orthopedics;  Laterality: Right;  Medial and Lateral Partial Menisectomy  . Arthroplasty w/ arthroscopy medial / lateral compartment knee     [facility], should read arthroscopy not arthroplasty   Precautions/Restrictions  Precautions Precautions: Fall Restrictions Weight Bearing Restrictions: Yes LLE Weight Bearing: Touchdown weight bearing Mobility (including Balance) Bed Mobility Supine to Sit: 3: Mod assist Supine to Sit Details (indicate cue type and reason): HOB elevated 70 degrees Sitting - Scoot to Edge of Bed: 3: Mod assist Sitting - Scoot to Edge of Bed Details (indicate cue type and reason): VCs for weight shifting and assistance needed for moving of LEs Transfers Transfers: Yes Sit to Stand: 3: Mod assist Sit to Stand Details (indicate cue type and reason): VCs for W/S to RLE and for hand placement on RW (pt wants to hold onto the front portion to "pull" herself up;R foot needs blocking due to lack of knee flexion Stand to Sit: 4: Min assist Stand to Sit Details: VCs for reaching back to guide descent Stand Pivot Transfers: 3: Mod assist Stand Pivot Transfer Details (indicate cue type and reason): Pt needs constant VCs to guide her through the sequencing of RW, weight shifting, use of UEs to distribute weight and gait pattern Ambulation/Gait Ambulation/Gait: Yes Ambulation/Gait Assistance:  3: Mod assist Ambulation/Gait Assistance Details (indicate cue type and reason): Pt able to "scoot" LEs in a step to pattern in the directions of fwd/L lateral and retro Ambulation Distance (Feet): 3 Feet Assistive device: Rolling walker Gait Pattern: Shuffle;Trunk flexed;Decreased dorsiflexion - left;Decreased dorsiflexion - right;Decreased weight shift to left Gait velocity: slow Stairs: No Wheelchair Mobility Wheelchair Mobility: No    Exercise  Total Joint Exercises Ankle  Circles/Pumps: Both;20 reps Quad Sets: Both;10 reps Gluteal Sets: Both;10 reps Short Arc Quad: Both;10 reps Heel Slides: Both;10 reps (RLE AAROM) Hip ABduction/ADduction: Both;10 reps ( RLE AAROM) Long Arc Quad: Both;10 reps General Exercises - Lower Extremity Ankle Circles/Pumps: Both;20 reps Quad Sets: Both;10 reps Gluteal Sets: Both;10 reps Short Arc Quad: Both;10 reps Long Arc Quad: Both;10 reps Heel Slides: Both;10 reps (RLE AAROM) Hip ABduction/ADduction: Both;10 reps ( RLE AAROM) Toe Raises: Both;20 reps Heel Raises: Both (20 reps) Low Level/ICU Exercises Ankle Circles/Pumps: Both;20 reps Quad Sets: Both;10 reps Short Arc Quad: Both;10 reps Hip ABduction/ADduction: Both;10 reps ( RLE AAROM) Heel Slides: Both;10 reps (RLE AAROM) Other Exercises Other Exercises: sit<>stand x3 for endurance/strengthening  End of Session PT - End of Session Activity Tolerance: Patient tolerated treatment well Patient left: in chair;with call bell in reach;with family/visitor present Nurse Communication: Mobility status for transfers General Behavior During Session: [facility] for tasks performed Cognition: [facility] for tasks performed PT Assessment/Plan  PT - Assessment/Plan Comments on Treatment Session: See notes above for mobility;pt needed assistance with bed exercises for RLE;able to transfer bed<>recliner with RW with Mod A due needing constant VCs for sequencing PT Goals  Acute Rehab PT Goals PT Goal: Supine/Side to Sit - Progress: Progressing toward goal PT Goal: Sit at Edge Of Bed - Progress: Progressing toward goal PT Transfer Goal: Sit to Stand/Stand to Sit - Progress: Progressing toward goal PT Goal: Stand - Progress: Progressing toward goal PT Goal: Ambulate - Progress: Progressing toward goal  Martine Bleecker ATKINSO 04/29/2011, 10:44 AM

## 2011-04-29 NOTE — Progress Notes (Signed)
Working with physical therapy.   Subjective: No new complaints.  Objective: Vital signs in last 24 hours: Filed Vitals:   04/29/11 0400 04/29/11 0543 04/29/11 0737 04/29/11 0750  BP:  115/58    Pulse:  88    Temp:  98.9 F (37.2 C)    TempSrc:  Oral    Resp: 18 20  18   Height:      Weight:      SpO2: 98% 97% 97% 96%   Weight change:   Intake/Output Summary (Last 24 hours) at 04/29/11 1029 Last data filed at 04/29/11 0800  Gross per 24 hour  Intake    921 ml  Output    150 ml  Net    771 ml    Lab Results: Basic Metabolic Panel:  Lab 04/29/11 1478 04/27/11 0734  NA 129* 129*  K 4.0 4.0  CL 93* 92*  CO2 30 30  GLUCOSE 151* 147*  BUN 14 12  CREATININE 0.63 0.73  CALCIUM 8.5 8.7  MG -- --  PHOS -- --   Liver Function Tests:  Lab 04/25/11 0425  AST 15  ALT 13  ALKPHOS 88  BILITOT 0.4  PROT 6.6  ALBUMIN 2.2*   No results found for this basename: LIPASE:2,AMYLASE:2 in the last 168 hours No results found for this basename: AMMONIA:2 in the last 168 hours CBC:  Lab 04/29/11 0520 04/28/11 0548 04/27/11 0734  WBC 14.4* 14.6* --  NEUTROABS -- 10.4* 11.1*  HGB 10.0* 10.5* --  HCT 31.0* 31.8* --  MCV 87.3 86.9 --  PLT 341 318 --   Cardiac Enzymes:  Lab 04/24/11 0757  CKTOTAL 87  CKMB 2.0  CKMBINDEX --  TROPONINI <0.30   BNP:  Lab 04/24/11 0755  POCBNP 1242.0*   D-Dimer: No results found for this basename: DDIMER:2 in the last 168 hours CBG:  Lab 04/27/11 0720 04/26/11 2135 04/26/11 1708  GLUCAP 133* 153* 190*   Hemoglobin A1C:  Lab 04/25/11 0425  HGBA1C 6.6*   Fasting Lipid Panel: No results found for this basename: CHOL,HDL,LDLCALC,TRIG,CHOLHDL,LDLDIRECT in the last 295 hours Thyroid Function Tests:  Lab 04/24/11 0758  TSH 2.445  T4TOTAL --  FREET4 1.36  T3FREE --  THYROIDAB --   Anemia Panel: No results found for this basename: VITAMINB12,FOLATE,FERRITIN,TIBC,IRON,RETICCTPCT in the last 168 hours  Alcohol Level: No results  found for this basename: ETH:2 in the last 168 hours Urinalysis: Specific gravity 1.015 pH 7.0 glucose 250 protein 30 nitrates positive moderate leukocyte esterase too numerous to count white cells 3-6 red cells few squamous epithelial cells many bacteria urine culture growing greater than 100,000 colonies of gram-negative rods  Micro Results: Recent Results (from the past 240 hour(s))  URINE CULTURE     Status: Normal   Collection Time   04/20/11  5:37 PM      Component Value Range Status Comment   Specimen Description URINE, CATHETERIZED   Final    Special Requests NONE   Final    Setup Time [id number]   Final    Colony Count NO GROWTH   Final    Culture NO GROWTH   Final    Report Status 04/22/2011 FINAL   Final   SURGICAL PCR SCREEN     Status: Normal   Collection Time   04/21/11  9:21 PM      Component Value Range Status Comment   MRSA, PCR NEGATIVE  NEGATIVE  Final    Staphylococcus aureus NEGATIVE  NEGATIVE  Final  URINE CULTURE     Status: Normal   Collection Time   04/24/11  9:27 AM      Component Value Range Status Comment   Specimen Description URINE, CATHETERIZED   Final    Special Requests NONE   Final    Setup Time [id number]   Final    Colony Count >=100,000 COLONIES/ML   Final    Culture ESCHERICHIA COLI   Final    Report Status 04/26/2011 FINAL   Final    Organism ID, Bacteria ESCHERICHIA COLI   Final    Studies/Results: No results found. Medications: I have reviewed the patient's current medications. Scheduled Meds:    . albuterol  2.5 mg Nebulization Q6H  . amLODipine  10 mg Oral Daily  . antiseptic oral rinse   Mouth Rinse BID  . aspirin  81 mg Oral Daily  . CALCIUM-MAGNESUIUM-ZINC  1 tablet Oral Daily  . calcium-vitamin D  1 tablet Oral Daily  . cefTRIAXone (ROCEPHIN) IV  1 g Intravenous Q24H  . digoxin  250 mcg Oral Daily  . enoxaparin  30 mg Subcutaneous Q12H  . fluticasone  1 puff Inhalation BID  . guaiFENesin  1,200 mg Oral Daily  .  HYDROmorphone PCA 0.3 mg/mL   Intravenous Q4H  . HYDROmorphone PCA 0.3 mg/mL      . levothyroxine  50 mcg Oral Daily  . loratadine  10 mg Oral Daily  . metFORMIN  500 mg Oral Q breakfast  . metoprolol  50-100 mg Oral BID  . pantoprazole  40 mg Oral BID AC  . polyethylene glycol  17 g Oral Daily  . Polyvinyl Alcohol-Povidone  1 drop Both Eyes UD  . rosuvastatin  10 mg Oral q1800  . sodium chloride  10 mL Intracatheter Q12H  . zolpidem  5 mg Oral QHS  . DISCONTD: ceFAZolin (ANCEF) IV  1 g Intravenous Once  . DISCONTD: ceFAZolin (ANCEF) IV  1 g Intravenous Once  . DISCONTD: povidone-iodine   Topical Once  . DISCONTD: sodium chloride  10 mL Intracatheter Q12H   Continuous Infusions:    . dextrose 5 % and 0.45% NaCl 20 mL/hr at 04/29/11 0756   PRN Meds:.acetaminophen, acetaminophen, acetaminophen, albuterol, ALPRAZolam, alum & mag hydroxide-simeth, bisacodyl, diphenhydrAMINE, HYDROmorphone, magnesium hydroxide, menthol-cetylpyridinium, naloxone, nitroGLYCERIN, promethazine, senna, sodium chloride, sodium chloride, sodium chloride, sodium phosphate, traMADol, DISCONTD: diphenhydrAMINE, DISCONTD: HYDROmorphone, DISCONTD: ondansetron (ZOFRAN) IV, DISCONTD: ondansetron Assessment/Plan: Active Problems:  UTI (urinary tract infection)  Leukocytosis  Hyponatremia  Fracture of left hip  HTN (hypertension)  CAD (coronary artery disease)  Asthmatic bronchitis  Anemia  Hypothyroidism  DM2 (diabetes mellitus, type 2)  Fracture of rib of left side  Change antibiotics to oral. Medical issues stable. Will return to examine.   LOS: 9 days   Antoninette Lerner L 04/29/2011, 10:29 AM

## 2011-04-29 NOTE — Progress Notes (Signed)
She is afebrile with no further temperature spike.  Her pain is controlled.  Vital signs are normal.  HGB is 10 and stable.  Her Sodium is 129.  Her white count remains elevated.  She will continue PT today.    Hopefully she will be able to have SNF arrangements made later today for tomorrow or Wednesday.  I will stop the PCA today.  Begin oral pain medicines.  Her wound is OK.

## 2011-04-30 ENCOUNTER — Inpatient Hospital Stay
Admission: RE | Admit: 2011-04-30 | Discharge: 2011-07-04 | Disposition: A | Discharge status: Home / Self Care | Payer: BC Managed Care – PPO | Source: Ambulatory Visit | Attending: Internal Medicine | Admitting: Internal Medicine

## 2011-04-30 DIAGNOSIS — T148XXA Other injury of unspecified body region, initial encounter: Principal | ICD-10-CM

## 2011-04-30 DIAGNOSIS — M549 Dorsalgia, unspecified: Secondary | ICD-10-CM

## 2011-04-30 MED ORDER — METFORMIN HCL 500 MG PO TABS: 500.0000 mg | ORAL_TABLET | Freq: Every day | ORAL | Status: DC

## 2011-04-30 MED ORDER — DEXTROSE-NACL 5-0.45 % IV SOLN: 20.0000 mL/h | INTRAVENOUS | Status: DC

## 2011-04-30 MED ORDER — ENOXAPARIN SODIUM 30 MG/0.3ML ~~LOC~~ SOLN: 30.0000 mg | Freq: Two times a day (BID) | SUBCUTANEOUS | Status: AC

## 2011-04-30 MED ORDER — DEXTROSE 5 % IV SOLN: 1.0000 g | INTRAVENOUS | Status: DC

## 2011-04-30 MED ORDER — TRAMADOL HCL 50 MG PO TABS: 50.0000 mg | ORAL_TABLET | Freq: Four times a day (QID) | ORAL | Status: AC | PRN

## 2011-04-30 NOTE — Progress Notes (Signed)
She is afebrile.  Her pain is controlled.  She is doing well in PT.  Her vitals are stable.  She received the unit of blood several days ago and her HGB is stable.  She received the blood for replacement after the fracture and after the loss of blood from surgery.  She is stable now.  She will be going to the skilled nursing unit today.  I have done the discharge summary.  I will see her back in one month in my office.

## 2011-04-30 NOTE — Progress Notes (Signed)
Discharged to [facility] in stable condition, packet sent, reported to C.Lorenda Hatchet, LPN, pt out via w/c with staff.

## 2011-04-30 NOTE — Consult Note (Signed)
Pt to be D/Ced today to [facility].  CSW spoke with Pt and daughter, and with staff at [facility].  All are in agreement with D/C plan.  Pt to be transported by hospital staff.  CSW will sign off at this time.

## 2011-04-30 NOTE — Discharge Summary (Signed)
Physician Discharge Summary  Patient ID: Katie Ford MRN: [id number] DOB/AGE: Feb 09, 1932 77 y.o.  Admit date: 04/20/2011 Discharge date: 04/30/2011  Admission Diagnoses: Comminuted fracture of the left hip intertrochanteric  Discharge Diagnoses: Comminuted fracture of the left hip, intertrochanteric Active Problems:  Leukocytosis  Hyponatremia  Fracture of left hip  HTN (hypertension)  CAD (coronary artery disease)  Asthmatic bronchitis  Anemia  Hypothyroidism  DM2 (diabetes mellitus, type 2)  Fracture of rib of left side  UTI (urinary tract infection)   Discharged Condition: good  Hospital Course: She was admitted from the Emergency Room on the 29th of September.  I was out of town and Dr. Romeo Apple did the admission.  She was taking a newer blood anti-coagulant and surgery had to be delayed until October 4th.  She was seen by me on October 1st.  Cardiology consult was obtained that day as well and they suggested delaying surgery as stated.  She remained in stable condition while awaiting surgery. She did have nightly episodes of increased temperature and that was investigated.  Chest Xrays showed no acute changes.  She did have a urinary tract infection documented by culture and sensitivity to be E. Coli.  She was treated for this and will remain on the antibiotics for several more days by IV.  She had a PICC line places as she had poor peripheral veins.  She pulled out the PICC line the night after surgery and a new line was placed.  She had "sundowning" at night with disorientation at times late at night.  This improved during the stay.  She had the left hip surgery on October 4th. She tolerated it well.  Post op she needed a blood transfusion secondary to blood loss from the fracture and the surgery.  Her HGB has remained stable since then.  Her vital signs remained stable.  She did have the temperature spikes but they went away after the treatment for the UTI.  She has  been seen in physical therapy and is doing well. She is walking better and has little pain.  She will continue this at the nursing home.  The nursing home to remove the staples next week.  Her cardiac status has been stable the entire stay.  I will see her in my office in one month with xrays prior to the visit.  She will be on enoxaparin daily for the next month and is aware of the precautions with this medicine.   Consults: cardiology and hospitalist.  Significant Diagnostic Studies: labs: showing increased white blood cell count and decreased hemoglobin both corrected during the admission , microbiology:positive for urinary tract infection from E. Coli and radiology: showing the fracture of the left hip and post surgical changes of stabilization.  Treatments: surgery: left hip intertrochanteric open treatment and internal reduction on April 25, 2011.  Discharge Exam: Blood pressure 186/75, pulse 94, temperature 99.2 F (37.3 C), temperature source Oral, resp. rate 18, height 5\' 4"  (1.626 m), weight 72.8 kg (160 lb 7.9 oz), SpO2 93.00%. She is alert and oriented at discharge.  Her hip wound on the left looks good with no erythema or discharge.  She has pain controlled and is using a walker well.  Disposition: Home or Self Care  Discharge Orders    Future Orders Please Complete By Expires   Diet - low sodium heart healthy      Call MD / Call 911      Comments:   If you experience chest pain  or shortness of breath, CALL 911 and be transported to the hospital emergency room.  If you develope a fever above 101 F, pus (white drainage) or increased drainage or redness at the wound, or calf pain, call your surgeon's office.   Constipation Prevention      Comments:   Drink plenty of fluids.  Prune juice may be helpful.  You may use a stool softener, such as Colace (over the counter) 100 mg twice a day.  Use MiraLax (over the counter) for constipation as needed.   Increase activity slowly as  tolerated      Weight Bearing as taught in Physical Therapy      Comments:   Use a walker or crutches as instructed.   Discharge instructions      Comments:   Use Walker. Toe Touch on the left. Change dressing daily.   Driving restrictions      Comments:   No driving for 6 weeks   Lifting restrictions      Comments:   No lifting for 6 weeks   Remove staples      Scheduling Instructions:   Remove staples at the skilled nursing unit on October 16th, then Steri-strip wound.     Current Discharge Medication List    START taking these medications   Details  dextrose 5 % SOLN 50 mL with cefTRIAXone 1 G SOLR 1 g Inject 1 g into the vein daily. Qty: 1 g, Refills: 0    Dextrose-Sodium Chloride (DEXTROSE 5 % AND 0.45% NACL) 5-0.45 % infusion Inject 20 mL/hr into the vein continuous. Qty: 1000 mL, Refills: 0    enoxaparin (LOVENOX) 30 MG/0.3ML SOLN Inject 0.3 mLs (30 mg total) into the skin every 12 (twelve) hours. Qty: 8.4 mL, Refills: 0    metFORMIN (GLUCOPHAGE) 500 MG tablet Take 1 tablet (500 mg total) by mouth daily with breakfast. Qty: 30 tablet, Refills: 0    traMADol (ULTRAM) 50 MG tablet Take 1 tablet (50 mg total) by mouth every 6 (six) hours as needed. Qty: 60 tablet, Refills: 0      CONTINUE these medications which have NOT CHANGED   Details  acetaminophen (TYLENOL) 325 MG tablet Take 325 mg by mouth every 6 (six) hours as needed. FOR PAIN     albuterol (PROAIR HFA) 108 (90 BASE) MCG/ACT inhaler Inhale 2 puffs into the lungs every 6 (six) hours as needed. FOR SHORTNESS OF BREATH    ALPRAZolam (XANAX) 0.5 MG tablet Take 0.5 mg by mouth daily as needed. TAKE HALF TO ONE  A TABLET IF NEEDED FOR ANXIETY     amLODipine (NORVASC) 10 MG tablet Take 10 mg by mouth daily.      Calcium Carbonate-Vitamin D (CALCIUM + D) 600-200 MG-UNIT TABS Take 1 tablet by mouth daily. OTC      CALCIUM-MAGNESUIUM-ZINC 333-133-8.3 MG TABS Take 1 tablet by mouth daily.     cetirizine  (ZYRTEC) 10 MG tablet Take 5 mg by mouth 2 (two) times daily. OTC   Take half a tablet twice daily FOR HIVES RELIEF    diclofenac-misoprostol (ARTHROTEC 75) 75-0.2 MG per tablet Take 1 tablet by mouth 2 (two) times daily.      digoxin (LANOXIN) 0.25 MG tablet Take 250 mcg by mouth daily.      fluticasone (FLOVENT HFA) 44 MCG/ACT inhaler Inhale 1 puff into the lungs 2 (two) times daily.      guaiFENesin (MUCINEX) 600 MG 12 hr tablet Take 1,200 mg by mouth daily.  OTC TAKE ONE TO TWO TABLETS IF NEEDED FOR CONGESTION     levothyroxine (SYNTHROID, LEVOTHROID) 50 MCG tablet Take 50 mcg by mouth daily.      metoprolol (TOPROL-XL) 50 MG 24 hr tablet Take 50-100 mg by mouth 2 (two) times daily. 50mg  every morning and 100mg  every evening.     pantoprazole (PROTONIX) 40 MG tablet TAKE ONE TABLET BY MOUTH TWICE DAILY Qty: 60 tablet, Refills: 3    Pitavastatin Calcium (LIVALO) 4 MG TABS Take 1 tablet by mouth daily.      Polyvinyl Alcohol-Povidone (MURINE TEARS FOR DRY EYES) 5-6 MG/ML SOLN Place 1 drop into both eyes as directed.      Soya Lecithin 1200 MG CAPS Take 1 tablet by mouth daily.      nitroGLYCERIN (NITROSTAT) 0.4 MG SL tablet Place 0.4 mg under the tongue every 5 (five) minutes as needed.        STOP taking these medications     EFFIENT 5 MG TABS        Follow-up Information    Follow up with Gatha Mcnulty in 1 month. (Xrays to be done of the left hip prior to the appointment in the office)    Contact information:   [address] [place] [address] [phone]          Signed: Darreld Mclean 04/30/2011, 9:26 AM

## 2011-05-01 LAB — GLUCOSE, CAPILLARY: Glucose-Capillary: 113 mg/dL — ABNORMAL HIGH (ref 70–99)

## 2011-05-02 ENCOUNTER — Encounter (HOSPITAL_COMMUNITY): Payer: Self-pay | Admitting: Orthopaedic Surgery

## 2011-05-02 LAB — GLUCOSE, CAPILLARY
Glucose-Capillary: 120 mg/dL — ABNORMAL HIGH (ref 70–99)
Glucose-Capillary: 105 mg/dL — ABNORMAL HIGH (ref 70–99)
Glucose-Capillary: 153 mg/dL — ABNORMAL HIGH (ref 70–99)

## 2011-05-03 LAB — GLUCOSE, CAPILLARY
Glucose-Capillary: 128 mg/dL — ABNORMAL HIGH (ref 70–99)
Glucose-Capillary: 96 mg/dL (ref 70–99)

## 2011-05-04 LAB — GLUCOSE, CAPILLARY
Glucose-Capillary: 112 mg/dL — ABNORMAL HIGH (ref 70–99)
Glucose-Capillary: 107 mg/dL — ABNORMAL HIGH (ref 70–99)

## 2011-05-05 LAB — GLUCOSE, CAPILLARY
Glucose-Capillary: 98 mg/dL (ref 70–99)
Glucose-Capillary: 134 mg/dL — ABNORMAL HIGH (ref 70–99)
Glucose-Capillary: 110 mg/dL — ABNORMAL HIGH (ref 70–99)

## 2011-05-07 LAB — GLUCOSE, CAPILLARY
Glucose-Capillary: 101 mg/dL — ABNORMAL HIGH (ref 70–99)
Glucose-Capillary: 91 mg/dL (ref 70–99)
Glucose-Capillary: 117 mg/dL — ABNORMAL HIGH (ref 70–99)

## 2011-05-08 LAB — GLUCOSE, CAPILLARY: Glucose-Capillary: 93 mg/dL (ref 70–99)

## 2011-05-10 LAB — GLUCOSE, CAPILLARY: Glucose-Capillary: 90 mg/dL (ref 70–99)

## 2011-05-11 LAB — GLUCOSE, CAPILLARY: Glucose-Capillary: 123 mg/dL — ABNORMAL HIGH (ref 70–99)

## 2011-05-12 LAB — GLUCOSE, CAPILLARY
Glucose-Capillary: 85 mg/dL (ref 70–99)
Glucose-Capillary: 128 mg/dL — ABNORMAL HIGH (ref 70–99)

## 2011-05-13 LAB — GLUCOSE, CAPILLARY: Glucose-Capillary: 117 mg/dL — ABNORMAL HIGH (ref 70–99)

## 2011-05-15 LAB — GLUCOSE, CAPILLARY
Glucose-Capillary: 90 mg/dL (ref 70–99)
Glucose-Capillary: 108 mg/dL — ABNORMAL HIGH (ref 70–99)

## 2011-05-16 LAB — GLUCOSE, CAPILLARY: Glucose-Capillary: 91 mg/dL (ref 70–99)

## 2011-05-18 LAB — GLUCOSE, CAPILLARY

## 2011-05-19 LAB — GLUCOSE, CAPILLARY
Glucose-Capillary: 92 mg/dL (ref 70–99)
Glucose-Capillary: 107 mg/dL — ABNORMAL HIGH (ref 70–99)

## 2011-05-20 LAB — GLUCOSE, CAPILLARY
Glucose-Capillary: 87 mg/dL (ref 70–99)
Glucose-Capillary: 132 mg/dL — ABNORMAL HIGH (ref 70–99)

## 2011-05-22 LAB — GLUCOSE, CAPILLARY: Glucose-Capillary: 123 mg/dL — ABNORMAL HIGH (ref 70–99)

## 2011-05-23 ENCOUNTER — Ambulatory Visit (HOSPITAL_COMMUNITY)
Admit: 2011-05-23 | Discharge: 2011-05-23 | Disposition: A | Discharge status: Skilled Nursing Facility | Payer: Medicare Other | Source: Skilled Nursing Facility | Attending: Internal Medicine | Admitting: Internal Medicine

## 2011-05-23 DIAGNOSIS — S72009D Fracture of unspecified part of neck of unspecified femur, subsequent encounter for closed fracture with routine healing: Secondary | ICD-10-CM | POA: Insufficient documentation

## 2011-05-23 LAB — GLUCOSE, CAPILLARY: Glucose-Capillary: 86 mg/dL (ref 70–99)

## 2011-05-24 LAB — GLUCOSE, CAPILLARY: Glucose-Capillary: 110 mg/dL — ABNORMAL HIGH (ref 70–99)

## 2011-05-25 LAB — GLUCOSE, CAPILLARY: Glucose-Capillary: 118 mg/dL — ABNORMAL HIGH (ref 70–99)

## 2011-05-26 LAB — GLUCOSE, CAPILLARY: Glucose-Capillary: 81 mg/dL (ref 70–99)

## 2011-05-27 LAB — GLUCOSE, CAPILLARY: Glucose-Capillary: 148 mg/dL — ABNORMAL HIGH (ref 70–99)

## 2011-05-28 LAB — GLUCOSE, CAPILLARY
Glucose-Capillary: 97 mg/dL (ref 70–99)
Glucose-Capillary: 100 mg/dL — ABNORMAL HIGH (ref 70–99)

## 2011-05-29 LAB — GLUCOSE, CAPILLARY: Glucose-Capillary: 87 mg/dL (ref 70–99)

## 2011-05-31 LAB — GLUCOSE, CAPILLARY: Glucose-Capillary: 143 mg/dL — ABNORMAL HIGH (ref 70–99)

## 2011-06-01 LAB — GLUCOSE, CAPILLARY
Glucose-Capillary: 89 mg/dL (ref 70–99)
Glucose-Capillary: 130 mg/dL — ABNORMAL HIGH (ref 70–99)

## 2011-06-03 LAB — GLUCOSE, CAPILLARY: Glucose-Capillary: 99 mg/dL (ref 70–99)

## 2011-06-04 LAB — GLUCOSE, CAPILLARY: Glucose-Capillary: 117 mg/dL — ABNORMAL HIGH (ref 70–99)

## 2011-06-05 LAB — GLUCOSE, CAPILLARY
Glucose-Capillary: 102 mg/dL — ABNORMAL HIGH (ref 70–99)
Glucose-Capillary: 151 mg/dL — ABNORMAL HIGH (ref 70–99)

## 2011-06-06 LAB — GLUCOSE, CAPILLARY: Glucose-Capillary: 93 mg/dL (ref 70–99)

## 2011-06-07 LAB — GLUCOSE, CAPILLARY

## 2011-06-08 LAB — GLUCOSE, CAPILLARY: Glucose-Capillary: 176 mg/dL — ABNORMAL HIGH (ref 70–99)

## 2011-06-09 LAB — GLUCOSE, CAPILLARY: Glucose-Capillary: 90 mg/dL (ref 70–99)

## 2011-06-10 LAB — GLUCOSE, CAPILLARY: Glucose-Capillary: 135 mg/dL — ABNORMAL HIGH (ref 70–99)

## 2011-06-11 LAB — GLUCOSE, CAPILLARY

## 2011-06-12 LAB — GLUCOSE, CAPILLARY
Glucose-Capillary: 94 mg/dL (ref 70–99)
Glucose-Capillary: 120 mg/dL — ABNORMAL HIGH (ref 70–99)

## 2011-06-13 LAB — GLUCOSE, CAPILLARY: Glucose-Capillary: 99 mg/dL (ref 70–99)

## 2011-06-14 LAB — GLUCOSE, CAPILLARY: Glucose-Capillary: 95 mg/dL (ref 70–99)

## 2011-06-15 LAB — GLUCOSE, CAPILLARY
Glucose-Capillary: 92 mg/dL (ref 70–99)
Glucose-Capillary: 145 mg/dL — ABNORMAL HIGH (ref 70–99)

## 2011-06-16 LAB — GLUCOSE, CAPILLARY
Glucose-Capillary: 91 mg/dL (ref 70–99)
Glucose-Capillary: 142 mg/dL — ABNORMAL HIGH (ref 70–99)

## 2011-06-18 LAB — GLUCOSE, CAPILLARY: Glucose-Capillary: 136 mg/dL — ABNORMAL HIGH (ref 70–99)

## 2011-06-19 LAB — GLUCOSE, CAPILLARY
Glucose-Capillary: 114 mg/dL — ABNORMAL HIGH (ref 70–99)
Glucose-Capillary: 112 mg/dL — ABNORMAL HIGH (ref 70–99)

## 2011-06-20 ENCOUNTER — Ambulatory Visit (HOSPITAL_COMMUNITY)
Admit: 2011-06-20 | Discharge: 2011-06-20 | Disposition: A | Discharge status: Skilled Nursing Facility | Payer: Medicare Other | Source: Skilled Nursing Facility | Attending: Internal Medicine | Admitting: Internal Medicine

## 2011-06-20 DIAGNOSIS — S72009D Fracture of unspecified part of neck of unspecified femur, subsequent encounter for closed fracture with routine healing: Secondary | ICD-10-CM | POA: Insufficient documentation

## 2011-06-20 LAB — GLUCOSE, CAPILLARY: Glucose-Capillary: 95 mg/dL (ref 70–99)

## 2011-06-24 LAB — GLUCOSE, CAPILLARY
Glucose-Capillary: 129 mg/dL — ABNORMAL HIGH (ref 70–99)
Glucose-Capillary: 100 mg/dL — ABNORMAL HIGH (ref 70–99)

## 2011-06-26 LAB — GLUCOSE, CAPILLARY: Glucose-Capillary: 147 mg/dL — ABNORMAL HIGH (ref 70–99)

## 2011-06-27 LAB — GLUCOSE, CAPILLARY: Glucose-Capillary: 112 mg/dL — ABNORMAL HIGH (ref 70–99)

## 2011-06-28 ENCOUNTER — Ambulatory Visit (HOSPITAL_COMMUNITY)
Admit: 2011-06-28 | Discharge: 2011-06-28 | Disposition: A | Discharge status: Skilled Nursing Facility | Payer: Medicare Other | Source: Skilled Nursing Facility | Attending: Internal Medicine | Admitting: Internal Medicine

## 2011-06-28 DIAGNOSIS — M546 Pain in thoracic spine: Secondary | ICD-10-CM | POA: Insufficient documentation

## 2011-06-28 DIAGNOSIS — M545 Low back pain, unspecified: Secondary | ICD-10-CM | POA: Insufficient documentation

## 2011-06-28 DIAGNOSIS — M5030 Other cervical disc degeneration, unspecified cervical region: Secondary | ICD-10-CM | POA: Insufficient documentation

## 2011-06-28 DIAGNOSIS — M542 Cervicalgia: Secondary | ICD-10-CM | POA: Insufficient documentation

## 2011-06-28 DIAGNOSIS — M51379 Other intervertebral disc degeneration, lumbosacral region without mention of lumbar back pain or lower extremity pain: Secondary | ICD-10-CM | POA: Insufficient documentation

## 2011-06-28 DIAGNOSIS — IMO0002 Reserved for concepts with insufficient information to code with codable children: Secondary | ICD-10-CM | POA: Insufficient documentation

## 2011-06-28 DIAGNOSIS — M5137 Other intervertebral disc degeneration, lumbosacral region: Secondary | ICD-10-CM | POA: Insufficient documentation

## 2011-06-28 LAB — GLUCOSE, CAPILLARY: Glucose-Capillary: 94 mg/dL (ref 70–99)

## 2011-07-01 LAB — GLUCOSE, CAPILLARY
Glucose-Capillary: 101 mg/dL — ABNORMAL HIGH (ref 70–99)
Glucose-Capillary: 140 mg/dL — ABNORMAL HIGH (ref 70–99)
Glucose-Capillary: 132 mg/dL — ABNORMAL HIGH (ref 70–99)

## 2011-07-02 LAB — GLUCOSE, CAPILLARY
Glucose-Capillary: 107 mg/dL — ABNORMAL HIGH (ref 70–99)
Glucose-Capillary: 105 mg/dL — ABNORMAL HIGH (ref 70–99)

## 2011-07-03 LAB — GLUCOSE, CAPILLARY: Glucose-Capillary: 104 mg/dL — ABNORMAL HIGH (ref 70–99)

## 2011-07-04 LAB — GLUCOSE, CAPILLARY
Glucose-Capillary: 141 mg/dL — ABNORMAL HIGH (ref 70–99)
Glucose-Capillary: 85 mg/dL (ref 70–99)

## 2011-07-08 ENCOUNTER — Other Ambulatory Visit: Payer: Self-pay

## 2011-07-10 ENCOUNTER — Ambulatory Visit (HOSPITAL_COMMUNITY): Payer: Medicare Other | Admitting: Physical Therapy

## 2011-07-18 ENCOUNTER — Encounter: Payer: Self-pay | Admitting: Cardiology

## 2011-07-18 ENCOUNTER — Ambulatory Visit (INDEPENDENT_AMBULATORY_CARE_PROVIDER_SITE_OTHER): Payer: Medicare Other | Admitting: Cardiology

## 2011-07-18 DIAGNOSIS — I471 Supraventricular tachycardia: Secondary | ICD-10-CM

## 2011-07-18 DIAGNOSIS — S72009A Fracture of unspecified part of neck of unspecified femur, initial encounter for closed fracture: Secondary | ICD-10-CM

## 2011-07-18 DIAGNOSIS — R9430 Abnormal result of cardiovascular function study, unspecified: Secondary | ICD-10-CM | POA: Insufficient documentation

## 2011-07-18 DIAGNOSIS — Z789 Other specified health status: Secondary | ICD-10-CM

## 2011-07-18 DIAGNOSIS — IMO0002 Reserved for concepts with insufficient information to code with codable children: Secondary | ICD-10-CM | POA: Insufficient documentation

## 2011-07-18 DIAGNOSIS — I2510 Atherosclerotic heart disease of native coronary artery without angina pectoris: Secondary | ICD-10-CM

## 2011-07-18 DIAGNOSIS — R0989 Other specified symptoms and signs involving the circulatory and respiratory systems: Secondary | ICD-10-CM

## 2011-07-18 NOTE — Patient Instructions (Signed)
Your physician wants you to follow-up in: 6 months. You will receive a reminder letter in the mail one-two months in advance. If you don't receive a letter, please call our office to schedule the follow-up appointment. Your physician recommends that you continue on your current medications as directed. Please refer to the Current Medication list given to you today. 

## 2011-07-18 NOTE — Assessment & Plan Note (Signed)
Coronary disease is stable. No further workup needed. She remains on Prasugrel long-term

## 2011-07-18 NOTE — Assessment & Plan Note (Signed)
She has not had any recurrent arrhythmias. Six-month followup.

## 2011-07-18 NOTE — Assessment & Plan Note (Signed)
Patient is allergic to aspirin and she does not tolerate Plavix. She needs to remain on Prasugrel long-term.

## 2011-07-18 NOTE — Progress Notes (Signed)
HPI Patient is seen for cardiology followup. I saw her last in May, 2012. She was stable. She has known coronary disease. She received bare-metal stents to the right coronary artery in August, 2011. She is allergic to aspirin. She cannot tolerate Plavix. Therefore she is on long-term Prasugrel. This medicine was held for a period of time when she fractured her hip recently. It is now been restarted.  The patient was recovering from arthroscopic knee surgery. She fell and fractured her hip. This is recovering nicely. She did not have syncope.  Allergies  Allergen Reactions  . Advair Hfa Other (See Comments)    TACHYCARDIA   . Celecoxib Other (See Comments)    TACHYCARDIA  . Codeine Other (See Comments)    TACHYCARIDIA  . Hydrocodone Other (See Comments)    TACHYCARDIA  . Tartrazine Other (See Comments)    Causes Tachycardia    Current Outpatient Prescriptions  Medication Sig Dispense Refill  . albuterol (PROAIR HFA) 108 (90 BASE) MCG/ACT inhaler Inhale 2 puffs into the lungs every 6 (six) hours as needed. FOR SHORTNESS OF BREATH      . ALPRAZolam (XANAX) 0.5 MG tablet TAKE HALF TO ONE  A TABLET IF NEEDED FOR ANXIETY       . amLODipine (NORVASC) 10 MG tablet Take 10 mg by mouth daily.        . calcium-vitamin D (OSCAL WITH D) 250-125 MG-UNIT per tablet Take 1 tablet by mouth 2 (two) times daily.        . cetirizine (ZYRTEC) 10 MG tablet Take 5 mg by mouth 2 (two) times daily.       . diclofenac-misoprostol (ARTHROTEC 75) 75-0.2 MG per tablet Take 1 tablet by mouth 2 (two) times daily.        . digoxin (LANOXIN) 0.25 MG tablet Take 250 mcg by mouth daily.        Marland Kitchen docusate-casanthranol (PERICOLACE) 100-30 MG per capsule Take 1 capsule by mouth 2 (two) times daily.        . fluticasone (FLOVENT HFA) 44 MCG/ACT inhaler Inhale 1 puff into the lungs 2 (two) times daily.        Marland Kitchen guaiFENesin (MUCINEX) 600 MG 12 hr tablet Take 1,200 mg by mouth daily. OTC TAKE ONE TO TWO TABLETS IF NEEDED FOR  CONGESTION       . levofloxacin (LEVAQUIN) 500 MG tablet Take 1 tablet by mouth Daily.      Marland Kitchen levothyroxine (SYNTHROID, LEVOTHROID) 50 MCG tablet Take 50 mcg by mouth daily.        . metoprolol (TOPROL-XL) 50 MG 24 hr tablet 50mg  every morning and 100mg  every evening.      . nitroGLYCERIN (NITROSTAT) 0.4 MG SL tablet Place 0.4 mg under the tongue every 5 (five) minutes as needed.        . pantoprazole (PROTONIX) 40 MG tablet TAKE ONE TABLET BY MOUTH TWICE DAILY  60 tablet  3  . Pitavastatin Calcium (LIVALO) 4 MG TABS Take 1 tablet by mouth daily.        . polyethylene glycol (MIRALAX / GLYCOLAX) packet Take 17 g by mouth daily.        . Polyvinyl Alcohol-Povidone (MURINE TEARS FOR DRY EYES) 5-6 MG/ML SOLN Place 1 drop into both eyes as directed.        . prasugrel (EFFIENT) 10 MG TABS Take 10 mg by mouth daily.        Ermalinda Memos Lecithin 1200 MG CAPS Take 1 tablet by  mouth daily.        . traMADol (ULTRAM) 50 MG tablet Take 1 tablet by mouth Every 6 hours as needed.        History   Social History  . Marital Status: Widowed    Spouse Name: N/A    Number of Children: N/A  . Years of Education: N/A   Occupational History  . [employer]     Engineer, site   Social History Main Topics  . Smoking status: Never Smoker   . Smokeless tobacco: Never Used  . Alcohol Use: Yes     rarely  . Drug Use: No  . Sexually Active: No   Other Topics Concern  . Not on file   Social History Narrative   Retired Engineer, site    Family History  Problem Relation Age of Onset  . Diabetes Mother   . Arthritis Mother   . Heart attack Father   . Stroke Father   . Emphysema Father   . Cataracts Father   . Cancer Sister   . Heart disease Sister   . Arthritis Sister   . Arthritis Brother   . Allergies Brother     Past Medical History  Diagnosis Date  . Ejection fraction     EF 65 %... normal... catheterization 02/2010.  . Mitral regurgitation     mild, echo, June, 2007  . Paroxysmal  supraventricular tachycardia     infrequent episodes over the years..palpitations.  . Hypertension   . Asthmatic bronchitis   . Obesity   . CAD (coronary artery disease)     bare metal stents to 2 separate RCA lesions August, 2011, residual 60% LAD, medical therapy  /  plan was for effient for one month followed by Plavix,, but patient does not tolerate Plavix... therefore patient placed back on effient  . GERD (gastroesophageal reflux disease)   . Edema 03/29/2010    September, 2011  . Intolerance of drug     aspirin and plavix  . Hypothyroidism 04/24/2011  . Hyperglycemia 04/24/2011  . Fracture of rib of left side 04/24/2011  . DM2 (diabetes mellitus, type 2) 04/24/2011    Past Surgical History  Procedure Date  . Knee arthroscopy 11/2001    left knee  . Laparoscopic cholecystectomy 04/29/1996    [facility]  . Tubal ligation 09/1970  . [place] fracture [place] 12[place]1993    [place]  . [place] thumb[place]r* 05/[place]    [place]  . Total abdominal hysterectomy 1983    [facility]  . Cardiac catheterization 02/22/2010    2 stents, [facility]  . Knee arthroscopy 02/19/2011    Procedure: ARTHROSCOPY KNEE;  Surgeon: Darreld Mclean;  Location: [facility];  Service: Orthopedics;  Laterality: Right;  Medial and Lateral Partial Menisectomy  . Arthroplasty w/ arthroscopy medial / lateral compartment knee     [facility], should read arthroscopy not arthroplasty  . Orif hip fracture 04/25/2011    Procedure: OPEN REDUCTION INTERNAL FIXATION HIP;  Surgeon: Darreld Mclean;  Location: [facility];  Service: Orthopedics;  Laterality: Left;    ROS  Patient denies fever, chills, headache, sweats, rash, change in vision, change in hearing, chest pain, cough, nausea vomiting, urinary symptoms. All other systems are reviewed and are negative.  PHYSICAL EXAM Patient is here with her walker. She is here with his sister. She is oriented to person time and place. Affect is normal. There is no jugulovenous distention. Lungs reveal a few scattered rhonchi.  Cardiac exam reveals an S1 and S2.  There no clicks or significant murmurs. The abdomen is soft. There is no peripheral edema.  Filed Vitals:   07/18/11 1507  BP: 137/80  Pulse: 71  Height: 5\' 4"  (1.626 m)  Weight: 153 lb (69.4 kg)    ASSESSMENT & PLAN

## 2011-09-10 ENCOUNTER — Other Ambulatory Visit: Payer: Self-pay | Admitting: Cardiology

## 2011-10-28 NOTE — Progress Notes (Signed)
UR Chart Review Completed  

## 2011-11-22 ENCOUNTER — Emergency Department (HOSPITAL_COMMUNITY): Payer: Medicare Other

## 2011-11-22 ENCOUNTER — Encounter (HOSPITAL_COMMUNITY): Payer: Self-pay | Admitting: *Deleted

## 2011-11-22 ENCOUNTER — Emergency Department (HOSPITAL_COMMUNITY)
Admission: EM | Admit: 2011-11-22 | Discharge: 2011-11-22 | Disposition: A | Discharge status: Home / Self Care | Payer: Medicare Other | Attending: Emergency Medicine | Admitting: Emergency Medicine

## 2011-11-22 DIAGNOSIS — Z79899 Other long term (current) drug therapy: Secondary | ICD-10-CM | POA: Insufficient documentation

## 2011-11-22 DIAGNOSIS — R109 Unspecified abdominal pain: Secondary | ICD-10-CM | POA: Insufficient documentation

## 2011-11-22 DIAGNOSIS — I10 Essential (primary) hypertension: Secondary | ICD-10-CM | POA: Insufficient documentation

## 2011-11-22 DIAGNOSIS — R10816 Epigastric abdominal tenderness: Secondary | ICD-10-CM | POA: Insufficient documentation

## 2011-11-22 DIAGNOSIS — I2510 Atherosclerotic heart disease of native coronary artery without angina pectoris: Secondary | ICD-10-CM | POA: Insufficient documentation

## 2011-11-22 DIAGNOSIS — Z9889 Other specified postprocedural states: Secondary | ICD-10-CM | POA: Insufficient documentation

## 2011-11-22 DIAGNOSIS — R110 Nausea: Secondary | ICD-10-CM | POA: Insufficient documentation

## 2011-11-22 DIAGNOSIS — E119 Type 2 diabetes mellitus without complications: Secondary | ICD-10-CM | POA: Insufficient documentation

## 2011-11-22 DIAGNOSIS — E039 Hypothyroidism, unspecified: Secondary | ICD-10-CM | POA: Insufficient documentation

## 2011-11-22 DIAGNOSIS — K219 Gastro-esophageal reflux disease without esophagitis: Secondary | ICD-10-CM | POA: Insufficient documentation

## 2011-11-22 HISTORY — DX: Diverticulitis of intestine, part unspecified, without perforation or abscess without bleeding: K57.92

## 2011-11-22 LAB — URINALYSIS, ROUTINE W REFLEX MICROSCOPIC
Bilirubin Urine: NEGATIVE
Glucose, UA: NEGATIVE mg/dL
Hgb urine dipstick: NEGATIVE
Ketones, ur: NEGATIVE mg/dL
Leukocytes, UA: NEGATIVE
pH: 6 (ref 5.0–8.0)

## 2011-11-22 LAB — DIFFERENTIAL
Basophils Absolute: 0.1 10*3/uL (ref 0.0–0.1)
Basophils Relative: 1 % (ref 0–1)
Lymphocytes Relative: 24 % (ref 12–46)
Neutro Abs: 4.9 10*3/uL (ref 1.7–7.7)
Neutrophils Relative %: 53 % (ref 43–77)

## 2011-11-22 LAB — CBC
Hemoglobin: 13.3 g/dL (ref 12.0–15.0)
MCHC: 32.5 g/dL (ref 30.0–36.0)
RDW: 13.4 % (ref 11.5–15.5)
WBC: 9.3 10*3/uL (ref 4.0–10.5)

## 2011-11-22 LAB — COMPREHENSIVE METABOLIC PANEL
ALT: 13 U/L (ref 0–35)
AST: 16 U/L (ref 0–37)
Albumin: 3.6 g/dL (ref 3.5–5.2)
Alkaline Phosphatase: 115 U/L (ref 39–117)
CO2: 29 mEq/L (ref 19–32)
Chloride: 100 mEq/L (ref 96–112)
Potassium: 4.1 mEq/L (ref 3.5–5.1)
Sodium: 138 mEq/L (ref 135–145)
Total Bilirubin: 0.3 mg/dL (ref 0.3–1.2)

## 2011-11-22 MED ORDER — IOHEXOL 300 MG/ML  SOLN
100.0000 mL | Freq: Once | INTRAMUSCULAR | Status: AC | PRN
  Administered 2011-11-22: 100 mL via INTRAVENOUS

## 2011-11-22 MED ORDER — GI COCKTAIL ~~LOC~~
30.0000 mL | Freq: Once | ORAL | Status: AC
  Administered 2011-11-22: 30 mL via ORAL
  Filled 2011-11-22: qty 30

## 2011-11-22 MED ORDER — SUCRALFATE 1 G PO TABS: 1.0000 g | ORAL_TABLET | Freq: Four times a day (QID) | ORAL | Status: DC

## 2011-11-22 NOTE — ED Provider Notes (Signed)
History     CSN: [id number]  Arrival date & time 11/22/11  1352   First MD Initiated Contact with Patient 11/22/11 1419      Chief Complaint  Patient presents with  . Abdominal Pain    (Consider location/radiation/quality/duration/timing/severity/associated sxs/prior treatment) Patient is a 78 y.o. female presenting with abdominal pain. The history is provided by the patient (the pt states she has had abdominal pain epigastric and ruq since december.  pt states it improves with peptic ulcer medicine). No language interpreter was used.  Abdominal Pain The primary symptoms of the illness include abdominal pain and nausea. The primary symptoms of the illness do not include fatigue, vomiting or diarrhea. The current episode started more than 2 days ago. The onset of the illness was gradual. The problem has not changed since onset. Associated with: none. The patient states that she believes she is currently not pregnant. The patient has not had a change in bowel habit. Symptoms associated with the illness do not include chills, hematuria, frequency or back pain. Significant associated medical issues include PUD.    Past Medical History  Diagnosis Date  . Ejection fraction     EF 65 %... normal... catheterization 02/2010.  . Mitral regurgitation     mild, echo, June, 2007  . Paroxysmal supraventricular tachycardia     infrequent episodes over the years..palpitations.  . Hypertension   . Asthmatic bronchitis   . Obesity   . CAD (coronary artery disease)     bare metal stents to 2 separate RCA lesions August, 2011, residual 60% LAD, medical therapy  /  plan was for effient for one month followed by Plavix,, but patient does not tolerate Plavix... therefore patient placed back on effient  . GERD (gastroesophageal reflux disease)   . Edema 03/29/2010    September, 2011  . Intolerance of drug     aspirin and plavix  . Hypothyroidism 04/24/2011  . Hyperglycemia 04/24/2011  . Fracture of rib of  left side 04/24/2011  . DM2 (diabetes mellitus, type 2) 04/24/2011  . Hip fracture     October, 2012  . Diabetes mellitus   . Diverticulitis     Past Surgical History  Procedure Date  . Knee arthroscopy 11/2001    left knee  . Laparoscopic cholecystectomy 04/29/1996    [facility]  . Tubal ligation 09/1970  . Ankle fracture surgery 12/1993    [place]  . Sp arthro thumb*r* 05/1989    [place]  . Total abdominal hysterectomy 1983    [facility]  . Cardiac catheterization 02/22/2010    2 stents, [facility]  . Knee arthroscopy 02/19/2011    Procedure: ARTHROSCOPY KNEE;  Surgeon: Darreld Mclean;  Location: [facility];  Service: Orthopedics;  Laterality: Right;  Medial and Lateral Partial Menisectomy  . Arthroplasty w/ arthroscopy medial / lateral compartment knee     [facility], should read arthroscopy not arthroplasty  . Orif hip fracture 04/25/2011    Procedure: OPEN REDUCTION INTERNAL FIXATION HIP;  Surgeon: Darreld Mclean;  Location: [facility];  Service: Orthopedics;  Laterality: Left;    Family History  Problem Relation Age of Onset  . Diabetes Mother   . Arthritis Mother   . Heart attack Father   . Stroke Father   . Emphysema Father   . Cataracts Father   . Cancer Sister   . Heart disease Sister   . Arthritis Sister   . Arthritis Brother   . Allergies Brother     History  Substance Use  Topics  . Smoking status: Never Smoker   . Smokeless tobacco: Never Used  . Alcohol Use: No     rarely    OB History    Grav Para Term Preterm Abortions TAB SAB Ect Mult Living   6 6 6              Review of Systems  Constitutional: Negative for chills and fatigue.  HENT: Negative for congestion, sinus pressure and ear discharge.   Eyes: Negative for discharge.  Respiratory: Negative for cough.   Cardiovascular: Negative for chest pain.  Gastrointestinal: Positive for nausea and abdominal pain. Negative for vomiting and diarrhea.  Genitourinary: Negative for frequency and hematuria.  Musculoskeletal: Negative for  back pain.  Skin: Negative for rash.  Neurological: Negative for seizures and headaches.  Hematological: Negative.   Psychiatric/Behavioral: Negative for hallucinations.    Allergies  Celecoxib; Codeine; Fluticasone-salmeterol; Hydrocodone; and Tartrazine  Home Medications   Current Outpatient Rx  Name Route Sig Dispense Refill  . ALBUTEROL SULFATE HFA 108 (90 BASE) MCG/ACT IN AERS Inhalation Inhale 2 puffs into the lungs every 6 (six) hours as needed. FOR SHORTNESS OF BREATH    . ALPRAZOLAM 0.5 MG PO TABS  TAKE HALF TO ONE  A TABLET IF NEEDED FOR ANXIETY     . AMLODIPINE BESYLATE 10 MG PO TABS Oral Take 10 mg by mouth every morning.     Marland Kitchen CALCIUM CARBONATE-VITAMIN D 250-125 MG-UNIT PO TABS Oral Take 1 tablet by mouth 2 (two) times daily.      Marland Kitchen CETIRIZINE HCL 10 MG PO TABS Oral Take 5 mg by mouth 2 (two) times daily.     Marland Kitchen DICLOFENAC-MISOPROSTOL 75-200 MG-MCG PO TABS Oral Take 1 tablet by mouth 2 (two) times daily.      Marland Kitchen DIGOXIN 0.25 MG PO TABS Oral Take 250 mcg by mouth every morning.     Marland Kitchen DOCUSATE SODIUM-CASANTHRANOL 100-30 MG PO CAPS Oral Take 1 capsule by mouth 2 (two) times daily.      Marland Kitchen FLUTICASONE PROPIONATE  HFA 44 MCG/ACT IN AERO Inhalation Inhale 1 puff into the lungs 2 (two) times daily.      Marland Kitchen LEVOTHYROXINE SODIUM 50 MCG PO TABS Oral Take 50 mcg by mouth every evening.     Marland Kitchen METOPROLOL SUCCINATE ER 50 MG PO TB24  50mg  every morning and 100mg  every evening.    Marland Kitchen PANTOPRAZOLE SODIUM 40 MG PO TBEC  TAKE ONE TABLET BY MOUTH TWICE DAILY 60 tablet 3  . POLYETHYLENE GLYCOL 3350 PO PACK Oral Take 17 g by mouth daily.      Marland Kitchen PRASUGREL HCL 5 MG PO TABS      . SOYA LECITHIN 1200 MG PO CAPS Oral Take 1 tablet by mouth daily.      . TRAMADOL HCL 50 MG PO TABS Oral Take 1 tablet by mouth Every 6 hours as needed. For pain    . GUAIFENESIN ER 600 MG PO TB12 Oral Take 1,200 mg by mouth daily. OTC TAKE ONE TO TWO TABLETS IF NEEDED FOR CONGESTION     . NITROGLYCERIN 0.4 MG SL SUBL  Sublingual Place 0.4 mg under the tongue every 5 (five) minutes as needed.      Marland Kitchen POLYVINYL ALCOHOL-POVIDONE 5-6 MG/ML OP SOLN Both Eyes Place 1 drop into both eyes as directed.        BP 142/79  Pulse 58  Temp(Src) 97.6 F (36.4 C) (Oral)  Resp 16  Ht 5\' 3"  (1.6 m)  Wt 144 lb (65.318 kg)  BMI 25.51 kg/m2  SpO2 97%  Physical Exam  Constitutional: She is oriented to person, place, and time. She appears well-developed.  HENT:  Head: Normocephalic and atraumatic.  Eyes: Conjunctivae and EOM are normal. No scleral icterus.  Neck: Neck supple. No thyromegaly present.  Cardiovascular: Normal rate and regular rhythm.  Exam reveals no gallop and no friction rub.   No murmur heard. Pulmonary/Chest: No stridor. She has no wheezes. She has no rales. She exhibits no tenderness.  Abdominal: She exhibits no distension. There is tenderness. There is no rebound.       Mild tender epigastric  Musculoskeletal: Normal range of motion. She exhibits no edema.  Lymphadenopathy:    She has no cervical adenopathy.  Neurological: She is oriented to person, place, and time. Coordination normal.  Skin: No rash noted. No erythema.  Psychiatric: She has a normal mood and affect. Her behavior is normal.    ED Course  Procedures (including critical care time)  Labs Reviewed  DIFFERENTIAL - Abnormal; Notable for the following:    Eosinophils Relative 11 (*)    Eosinophils Absolute 1.0 (*)    All other components within normal limits  COMPREHENSIVE METABOLIC PANEL - Abnormal; Notable for the following:    Glucose, Bld 121 (*)    GFR calc non Af Amer 47 (*)    GFR calc Af Amer 55 (*)    All other components within normal limits  CBC  LIPASE, BLOOD  URINALYSIS, ROUTINE W REFLEX MICROSCOPIC   Ct Abdomen Pelvis W Contrast  11/22/2011  *RADIOLOGY REPORT*  Clinical Data: Diffuse abdominal pain.  CT ABDOMEN AND PELVIS WITH CONTRAST  Technique:  Multidetector CT imaging of the abdomen and pelvis was  performed following the standard protocol during bolus administration of intravenous contrast.  Contrast: OMNIPAQUE IOHEXOL 300 MG/ML  SOLN  Comparison: None.  Findings: No focal abnormalities seen in the liver or spleen.  The stomach, duodenum, pancreas, and right adrenal gland are unremarkable.  The 16 mm left adrenal nodule has an average attenuation of 50 HU on the portal venous phase study and 19 HU on the renal delay sequence.  The calculated relative percentage of enhancement washout is 62%, consistent with benign adrenal adenoma. There is no evidence for hydronephrosis in either kidney.  No renal masses evident.  Atherosclerotic calcification is seen in the wall of the abdominal aorta without aneurysm.  No free fluid or lymphadenopathy in the abdomen.  The hepatic flexure and proximal half of the transverse colon are abnormal with edematous wall thickening and pericolonic edema.  Imaging through the pelvis shows no free intraperitoneal fluid. Bladder is unremarkable.  Uterus is surgically absent.  There is no adnexal mass.  Diverticular changes are seen diffusely in the colon.  Terminal ileum is normal.  The appendix is normal.  Left dynamic hip screw is noted.  Bones are diffusely demineralized. Diffuse degenerative changes seen in the lower thoracic and upper lumbar spine.  IMPRESSION: Abnormal wall thickening and edema involving the hepatic flexure proximal transverse colon.  This may be related to segmental inflammatory or infectious colitis.  The celiac axis, superior mesenteric artery, and inferior mesenteric artery are patent, but ischemia, given the watershed distribution, is also a possibility.  Original Report Authenticated By: ERIC A. MANSELL, M.D.     1. Abdominal pain    Pt improved with gi cocktail.  Pt to follow up with gi next week.  Pt with no pain at  discharge MDM  abd pain probable pud.  Will have gi evaluated pt for ct findings        Benny Lennert, MD 11/22/11  [phone]

## 2011-11-22 NOTE — Discharge Instructions (Signed)
Follow up with dr. Fields next week °

## 2011-11-22 NOTE — ED Notes (Signed)
abd pain for 3 days, no nausea, no diarrhea., no vomiting.

## 2011-11-25 ENCOUNTER — Encounter: Payer: Self-pay | Admitting: Gastroenterology

## 2011-11-25 ENCOUNTER — Ambulatory Visit (INDEPENDENT_AMBULATORY_CARE_PROVIDER_SITE_OTHER): Payer: Medicare Other | Admitting: Gastroenterology

## 2011-11-25 ENCOUNTER — Telehealth: Payer: Self-pay | Admitting: Gastroenterology

## 2011-11-25 VITALS — BP 138/84 | HR 72 | Temp 98.2°F | Ht 63.0 in | Wt 144.0 lb

## 2011-11-25 DIAGNOSIS — R109 Unspecified abdominal pain: Secondary | ICD-10-CM | POA: Insufficient documentation

## 2011-11-25 DIAGNOSIS — R935 Abnormal findings on diagnostic imaging of other abdominal regions, including retroperitoneum: Secondary | ICD-10-CM

## 2011-11-25 MED ORDER — PEG 3350-KCL-NA BICARB-NACL 420 G PO SOLR: ORAL | Status: AC

## 2011-11-25 MED ORDER — ESOMEPRAZOLE MAGNESIUM 40 MG PO CPDR: 40.0000 mg | DELAYED_RELEASE_CAPSULE | Freq: Every day | ORAL | Status: DC

## 2011-11-25 NOTE — Telephone Encounter (Signed)
Pt called asking if she should stop her Effient before her procedure- Per AS she should stop it 3 days prior to procedure (05/31) - pt ok with this

## 2011-11-25 NOTE — Assessment & Plan Note (Addendum)
LUQ pain X 3 weeks, worsening. ED presentation with CT as described. Question correlation of symptom with findings on CT; however, unable to exclude gastritis, PUD. Denies N/V. Notes wt loss from 178 in Sept 2012 to currently 144. Decreased appetite for several months. Hx of GERD, tried/failed Prilosec and Protonix. Has done best with Nexium. Denies dysphagia.  Due to new-onset dyspepsia, will need assessment with EGD as well. Denies any pain currently. Will send in rx for Nexium, as this has done best for pt in past. Likely insurance will approve now as she has tried and failed 2 preferred PPIs.   EGD at time of TCS with Dr. Darrick Penna. The R/B/A have been discussed in detail. Pt states understanding. Nexium rx sent to pharmacy.  Hold Effient X 3 days prior.

## 2011-11-25 NOTE — Progress Notes (Signed)
Primary Care Physician:  Josue Hector, MD, MD Primary Gastroenterologist:  Dr. Darrick Penna   Chief Complaint  Patient presents with  . Abdominal Pain    on left side    HPI:   Katie Ford is a 78 year old female who presents today as an ED referral secondary to abnormal CT findings. Presented to ED on 5/3 with abdominal pain and nausea. CT with findings of edematous wall thickening of the hepatic flexure and proximal half of transverse colon, query segmental inflammatory or infectious colitis. Noted celiac axis, SMA, IMA were patent. Noted watershed distribution, unable to r/o ischemia.   States went to dentist March 5th, had to take amoxicillin due to heart hx. Amoxicillin increased short while later due to sinus infection. Severe abdominal pain Friday, went to ED. Had been on Protonix in past, Prilosec too expensive. Notes LUQ pain, building up for 3 weeks, culminating on Friday. No N/V. Hx of constipation, but since been on Protonix no issues with constipation. No diarrhea. Twice had watery stool, green, several weeks prior to ED.  Hasn't had pain today. Took last Carafate at 2 am. Had good BM today. Has lost wt, notes weighing 178 prior to falling in Sept 2012. Now 144. No correlation with eating/drinking. Decreased portion sizes since in [facility]. Doesn't want food like used to. No GERD, no dysphagia. No rectal bleeding.   No prior colonoscopy. Has done well with Nexium in the past. Protonix, Prilosec not working. No black tarry, stools. No NSAIDs, no aspirin powders.    Past Medical History  Diagnosis Date  . Ejection fraction     EF 65 %... normal... catheterization 02/2010.  . Mitral regurgitation     mild, echo, June, 2007  . Paroxysmal supraventricular tachycardia     infrequent episodes over the years..palpitations.  . Hypertension   . Asthmatic bronchitis   . Obesity   . CAD (coronary artery disease)     bare metal stents to 2 separate RCA lesions August, 2011, residual  60% LAD, medical therapy  /  plan was for effient for one month followed by Plavix,, but patient does not tolerate Plavix... therefore patient placed back on effient  . GERD (gastroesophageal reflux disease)   . Edema 03/29/2010    September, 2011  . Intolerance of drug     aspirin and plavix  . Hypothyroidism 04/24/2011  . Hyperglycemia 04/24/2011  . Fracture of rib of left side 04/24/2011  . DM2 (diabetes mellitus, type 2) 04/24/2011  . Hip fracture     October, 2012  . Diabetes mellitus   . Diverticulitis     remote hx    Past Surgical History  Procedure Date  . Knee arthroscopy 11/2001    left knee  . Laparoscopic cholecystectomy 04/29/1996    [facility]  . Tubal ligation 09/1970  . [place]le fracture [place]    [place]  . [place] thumb[place]* [place]  . [facility] abdominal hysterectomy 1983    [facility]  . Cardiac catheterization 02/22/2010    2 stents, [facility]  . Knee arthroscopy 02/19/2011    Procedure: ARTHROSCOPY KNEE;  Surgeon: Darreld Mclean;  Location: [facility];  Service: Orthopedics;  Laterality: Right;  Medial and Lateral Partial Menisectomy  . Arthroplasty w/ arthroscopy medial / lateral compartment knee     [facility], should read arthroscopy not arthroplasty  . Orif hip fracture 04/25/2011    Procedure: OPEN REDUCTION INTERNAL FIXATION HIP;  Surgeon: Darreld Mclean;  Location: [facility];  Service:  Orthopedics;  Laterality: Left;    Current Outpatient Prescriptions  Medication Sig Dispense Refill  . albuterol (PROAIR HFA) 108 (90 BASE) MCG/ACT inhaler Inhale 2 puffs into the lungs every 6 (six) hours as needed. FOR SHORTNESS OF BREATH      . ALPRAZolam (XANAX) 0.5 MG tablet TAKE HALF TO ONE  A TABLET IF NEEDED FOR ANXIETY       . amLODipine (NORVASC) 10 MG tablet Take 10 mg by mouth every morning.       . calcium-vitamin D (OSCAL WITH D) 250-125 MG-UNIT per tablet Take 1 tablet by mouth 2 (two) times daily.        . cetirizine (ZYRTEC) 10 MG tablet Take 5 mg by mouth 2 (two) times  daily.       . diclofenac-misoprostol (ARTHROTEC 75) 75-0.2 MG per tablet Take 1 tablet by mouth 2 (two) times daily.        . digoxin (LANOXIN) 0.25 MG tablet Take 250 mcg by mouth every morning.       . docusate-casanthranol (PERICOLACE) 100-30 MG per capsule Take 1 capsule by mouth 2 (two) times daily.        . fluticasone (FLOVENT HFA) 44 MCG/ACT inhaler Inhale 1 puff into the lungs 2 (two) times daily.        Marland Kitchen guaiFENesin (MUCINEX) 600 MG 12 hr tablet Take 1,200 mg by mouth daily. OTC TAKE ONE TO TWO TABLETS IF NEEDED FOR CONGESTION       . levothyroxine (SYNTHROID, LEVOTHROID) 50 MCG tablet Take 50 mcg by mouth every evening.       . metoprolol (TOPROL-XL) 50 MG 24 hr tablet 50mg  every morning and 100mg  every evening.      . nitroGLYCERIN (NITROSTAT) 0.4 MG SL tablet Place 0.4 mg under the tongue every 5 (five) minutes as needed.        . pantoprazole (PROTONIX) 40 MG tablet TAKE ONE TABLET BY MOUTH TWICE DAILY  60 tablet  3  . polyethylene glycol (MIRALAX / GLYCOLAX) packet Take 17 g by mouth daily.        . Polyvinyl Alcohol-Povidone (MURINE TEARS FOR DRY EYES) 5-6 MG/ML SOLN Place 1 drop into both eyes as directed.        . prasugrel (EFFIENT) 5 MG TABS       . Soya Lecithin 1200 MG CAPS Take 1 tablet by mouth daily.        . sucralfate (CARAFATE) 1 G tablet Take 1 tablet (1 g total) by mouth 4 (four) times daily.  40 tablet  0  . traMADol (ULTRAM) 50 MG tablet Take 1 tablet by mouth Every 6 hours as needed. For pain      . esomeprazole (NEXIUM) 40 MG capsule Take 1 capsule (40 mg total) by mouth daily before breakfast.  30 capsule  3  . polyethylene glycol-electrolytes (TRILYTE) 420 G solution Use as directed Also buy 1 fleet enema & 4 dulcolax tablets to use as directed  4000 mL  0    Allergies as of 11/25/2011 - Review Complete 11/25/2011  Allergen Reaction Noted  . Celecoxib Other (See Comments)   . Codeine Other (See Comments)   . Fluticasone-salmeterol Other (See Comments)     . Hydrocodone Other (See Comments)   . Tartrazine Other (See Comments) 02/05/2011    Family History  Problem Relation Age of Onset  . Diabetes Mother   . Arthritis Mother   . Heart attack Father   . Stroke Father   .  Emphysema Father   . Cataracts Father   . Cancer Sister   . Heart disease Sister   . Arthritis Sister   . Arthritis Brother   . Allergies Brother   . Colon cancer Neg Hx     History   Social History  . Marital Status: Widowed    Spouse Name: N/A    Number of Children: N/A  . Years of Education: N/A   Occupational History  . [employer]     Engineer, site   Social History Main Topics  . Smoking status: Never Smoker   . Smokeless tobacco: Never Used  . Alcohol Use: No     rarely  . Drug Use: No  . Sexually Active: No   Other Topics Concern  . Not on file   Social History Narrative   Retired Engineer, site    Review of Systems: Gen: SEE HPI  CV: Denies chest pain, heart palpitations, peripheral edema, syncope.  Resp: Denies shortness of breath at rest or with exertion. Denies wheezing or cough.  GI: Denies dysphagia or odynophagia. Denies jaundice, hematemesis, fecal incontinence. GU : Denies urinary burning, urinary frequency, urinary hesitancy MS: Denies joint pain, muscle weakness, cramps, or limitation of movement.  Derm: Denies rash, itching, dry skin Psych: Denies depression, anxiety, memory loss, and confusion Heme: Denies bruising, bleeding, and enlarged lymph nodes.  Physical Exam: BP 138/84  Pulse 72  Temp(Src) 98.2 F (36.8 C) (Temporal)  Ht 5\' 3"  (1.6 m)  Wt 144 lb (65.318 kg)  BMI 25.51 kg/m2 General:   Alert and oriented. Pleasant and cooperative. Well-nourished and well-developed.  Head:  Normocephalic and atraumatic. Eyes:  Without icterus, sclera clear and conjunctiva pink.  Ears:  Normal auditory acuity. Nose:  No deformity, discharge,  or lesions. Mouth:  No deformity or lesions, oral mucosa pink.  Neck:  Supple,  without mass or thyromegaly. Lungs:  Scattered rhonchi, mild posterior wheezing in bases.  Heart:  S1, S2 present without murmurs appreciated.  Abdomen:  +BS, soft, LLQ "aware" of palpation but no pain. non-distended. No HSM noted. No guarding or rebound. No masses appreciated.  Rectal:  Deferred  Msk:  Symmetrical without gross deformities. Normal posture. Extremities:  Without clubbing or edema. Neurologic:  Alert and  oriented x4;  grossly normal neurologically. Skin:  Intact without significant lesions or rashes. Cervical Nodes:  No significant cervical adenopathy. Psych:  Alert and cooperative. Normal mood and affect.

## 2011-11-25 NOTE — Patient Instructions (Signed)
We have set you up for a colonoscopy and upper endoscopy towards the timeframe of June.  Start taking Nexium daily, 30 minutes before the first meal of the day.  We will be in touch shortly to let you know if anything further (such as antibiotics) need to be given. I will be reviewing the CT scan with Dr. Darrick Penna.  Please contact us with any recurrence of your abdominal pain, nausea, vomiting, rectal bleeding.

## 2011-11-25 NOTE — Assessment & Plan Note (Addendum)
Recent ED visit 5/3 with CT with findings of edematous wall thickening of the hepatic flexure and proximal half of transverse colon, query segmental inflammatory or infectious colitis. Noted celiac axis, SMA, IMA were patent. Unable to rule out ischemia. Associated LUQ pain X 3 weeks, no associated diarrhea. Prior hx of constipation but improved since taking Protonix per pt. No prior colonoscopy.  Exposure to Amoxicillin early March. Again, pt without any diarrhea. Symptoms significantly improved since ED presentation, denies any pain today.   Doubt dealing with mesenteric ischemia. May be self-limiting process; however, no prior TCS is concerning. Exposure to abx raises concern for Cdiff, yet no diarrhea. Need to proceed with lower GI evaluation in next 3-4 weeks. If develops diarrhea, will obtain stool studies.   Proceed with colonoscopy with Dr. Darrick Penna in the near future. The risks, benefits, and alternatives have been discussed in detail with the patient. They state understanding and desire to proceed.  Hold Effient X 3 days prior

## 2011-11-26 NOTE — Progress Notes (Signed)
Faxed to PCP

## 2011-11-27 NOTE — Progress Notes (Signed)
Reviewed with Dr. Myrtis Ser: ok to hold Effient X 3 days prior. Pt already aware.

## 2011-11-28 ENCOUNTER — Telehealth: Payer: Self-pay

## 2011-11-28 ENCOUNTER — Encounter: Payer: Self-pay | Admitting: Gastroenterology

## 2011-11-28 ENCOUNTER — Telehealth: Payer: Self-pay | Admitting: Internal Medicine

## 2011-11-28 ENCOUNTER — Emergency Department (HOSPITAL_COMMUNITY)
Admission: EM | Admit: 2011-11-28 | Discharge: 2011-11-28 | Disposition: A | Discharge status: Home / Self Care | Payer: Medicare Other | Attending: Emergency Medicine | Admitting: Emergency Medicine

## 2011-11-28 ENCOUNTER — Encounter (HOSPITAL_COMMUNITY): Payer: Self-pay

## 2011-11-28 DIAGNOSIS — Z9889 Other specified postprocedural states: Secondary | ICD-10-CM | POA: Insufficient documentation

## 2011-11-28 DIAGNOSIS — I10 Essential (primary) hypertension: Secondary | ICD-10-CM | POA: Insufficient documentation

## 2011-11-28 DIAGNOSIS — I2510 Atherosclerotic heart disease of native coronary artery without angina pectoris: Secondary | ICD-10-CM | POA: Insufficient documentation

## 2011-11-28 DIAGNOSIS — R1012 Left upper quadrant pain: Secondary | ICD-10-CM | POA: Insufficient documentation

## 2011-11-28 DIAGNOSIS — K219 Gastro-esophageal reflux disease without esophagitis: Secondary | ICD-10-CM | POA: Insufficient documentation

## 2011-11-28 DIAGNOSIS — R51 Headache: Secondary | ICD-10-CM | POA: Insufficient documentation

## 2011-11-28 DIAGNOSIS — Z79899 Other long term (current) drug therapy: Secondary | ICD-10-CM | POA: Insufficient documentation

## 2011-11-28 DIAGNOSIS — M549 Dorsalgia, unspecified: Secondary | ICD-10-CM | POA: Insufficient documentation

## 2011-11-28 DIAGNOSIS — E119 Type 2 diabetes mellitus without complications: Secondary | ICD-10-CM | POA: Insufficient documentation

## 2011-11-28 DIAGNOSIS — E039 Hypothyroidism, unspecified: Secondary | ICD-10-CM | POA: Insufficient documentation

## 2011-11-28 MED ORDER — METRONIDAZOLE 500 MG PO TABS: 500.0000 mg | ORAL_TABLET | Freq: Two times a day (BID) | ORAL | Status: AC

## 2011-11-28 MED ORDER — METRONIDAZOLE 500 MG PO TABS
500.0000 mg | ORAL_TABLET | Freq: Once | ORAL | Status: AC
  Administered 2011-11-28: 500 mg via ORAL
  Filled 2011-11-28: qty 1

## 2011-11-28 MED ORDER — OXYCODONE-ACETAMINOPHEN 5-325 MG PO TABS: ORAL_TABLET | ORAL | Status: DC

## 2011-11-28 MED ORDER — ONDANSETRON HCL 4 MG PO TABS: 4.0000 mg | ORAL_TABLET | Freq: Four times a day (QID) | ORAL | Status: AC

## 2011-11-28 MED ORDER — TRAMADOL HCL 50 MG PO TABS: 50.0000 mg | ORAL_TABLET | Freq: Four times a day (QID) | ORAL | Status: AC | PRN

## 2011-11-28 MED ORDER — TRAMADOL HCL 50 MG PO TABS
50.0000 mg | ORAL_TABLET | Freq: Once | ORAL | Status: AC
  Administered 2011-11-28: 50 mg via ORAL
  Filled 2011-11-28: qty 1

## 2011-11-28 MED ORDER — ONDANSETRON HCL 4 MG PO TABS
4.0000 mg | ORAL_TABLET | Freq: Once | ORAL | Status: AC
  Administered 2011-11-28: 4 mg via ORAL
  Filled 2011-11-28: qty 1

## 2011-11-28 NOTE — Telephone Encounter (Signed)
Noted  

## 2011-11-28 NOTE — Telephone Encounter (Signed)
Yes

## 2011-11-28 NOTE — ED Notes (Signed)
Pt reports continued abdominal pain on the left side. Pt states hurts so bad she is unable to sleep.

## 2011-11-28 NOTE — Telephone Encounter (Signed)
REVIEWED.  

## 2011-11-28 NOTE — Telephone Encounter (Signed)
Pt was informed.

## 2011-11-28 NOTE — Telephone Encounter (Signed)
Forwarding to Gerrit Halls, NP, who saw pt on 11/25/2011.

## 2011-11-28 NOTE — ED Provider Notes (Signed)
History     CSN: [id number]  Arrival date & time 11/28/11  0350   First MD Initiated Contact with Patient 11/28/11 [phone]      Chief Complaint  Patient presents with  . Abdominal Pain    (Consider location/radiation/quality/duration/timing/severity/associated sxs/prior treatment) HPI  [place] B Ey is a 78 y.o. female history of diabetes, hypertension, coronary artery disease, status post stent placement, GERD, mitral regurgitation, diverticulitis, who presents to the Emergency Department complaining of abdominal pain that has been persistent for several days. She was seen here on 11/25/2011 with a CT showing focal bowel inflammation without diarrhea. She has been seen by GI and is scheduled for a colonoscopy the first week of June. It is located in the left upper quadrant and radiates to the back. It is associated with a headache today. She denies fever, chills, nausea, vomiting, diarrhea. They have restarted her on her Nexium which has made no difference to this pain.  PCP Dr. Lysbeth Galas GI Dr. Darrick Penna Cardiologist Dr. Myrtis Ser  Past Medical History  Diagnosis Date  . Ejection fraction     EF 65 %... normal... catheterization 02/2010.  . Mitral regurgitation     mild, echo, June, 2007  . Paroxysmal supraventricular tachycardia     infrequent episodes over the years..palpitations.  . Hypertension   . Asthmatic bronchitis   . Obesity   . CAD (coronary artery disease)     bare metal stents to 2 separate RCA lesions August, 2011, residual 60% LAD, medical therapy  /  plan was for effient for one month followed by Plavix,, but patient does not tolerate Plavix... therefore patient placed back on effient  . GERD (gastroesophageal reflux disease)   . Edema 03/29/2010    September, 2011  . Intolerance of drug     aspirin and plavix  . Hypothyroidism 04/24/2011  . Hyperglycemia 04/24/2011  . Fracture of rib of left side 04/24/2011  . DM2 (diabetes mellitus, type 2) 04/24/2011  . Hip fracture       October, 2012  . Diabetes mellitus   . Diverticulitis     remote hx    Past Surgical History  Procedure Date  . Knee arthroscopy 11/2001    left knee  . Laparoscopic cholecystectomy 04/29/1996    [facility]  . Tubal ligation 09/1970  . Ankle fracture [place] 12/1993    [facility]  . Sp arthro thumb*r* 05/1989    [facility]  . Total abdominal hysterectomy 1983    [facility]  . Cardiac catheterization 02/22/2010    2 stents, [facility]  . Knee arthroscopy 02/19/2011    Procedure: ARTHROSCOPY KNEE;  Surgeon: Darreld Mclean;  Location: [facility];  Service: Orthopedics;  Laterality: Right;  Medial and Lateral Partial Menisectomy  . Arthroplasty w/ arthroscopy medial / lateral compartment knee     [facility], should read arthroscopy not arthroplasty  . Orif hip fracture 04/25/2011    Procedure: OPEN REDUCTION INTERNAL FIXATION HIP;  Surgeon: Darreld Mclean;  Location: [facility];  Service: Orthopedics;  Laterality: Left;    Family History  Problem Relation Age of Onset  . Diabetes Mother   . Arthritis Mother   . Heart attack Father   . Stroke Father   . Emphysema Father   . Cataracts Father   . Cancer Sister   . Heart disease Sister   . Arthritis Sister   . Arthritis Brother   . Allergies Brother   . Colon cancer Neg Hx     History  Substance Use Topics  .  Smoking status: Never Smoker   . Smokeless tobacco: Never Used  . Alcohol Use: No     rarely    OB History    Grav Para Term Preterm Abortions TAB SAB Ect Mult Living   6 6 6              Review of Systems  Constitutional: Negative for fever.       10 Systems reviewed and are negative for acute change except as noted in the HPI.  HENT: Negative for congestion.   Eyes: Negative for discharge and redness.  Respiratory: Negative for cough and shortness of breath.   Cardiovascular: Negative for chest pain.  Gastrointestinal: Positive for abdominal pain. Negative for nausea, vomiting and diarrhea.  Musculoskeletal: Negative for back pain.  Skin:  Negative for rash.  Neurological: Positive for headaches. Negative for syncope and numbness.  Psychiatric/Behavioral:       No behavior change.    Allergies  Celecoxib; Codeine; Fluticasone-salmeterol; Hydrocodone; and Tartrazine  Home Medications   Current Outpatient Rx  Name Route Sig Dispense Refill  . ALBUTEROL SULFATE HFA 108 (90 BASE) MCG/ACT IN AERS Inhalation Inhale 2 puffs into the lungs every 6 (six) hours as needed. FOR SHORTNESS OF BREATH    . ALPRAZOLAM 0.5 MG PO TABS  TAKE HALF TO ONE  A TABLET IF NEEDED FOR ANXIETY     . AMLODIPINE BESYLATE 10 MG PO TABS Oral Take 10 mg by mouth every morning.     Marland Kitchen CALCIUM CARBONATE-VITAMIN D 250-125 MG-UNIT PO TABS Oral Take 1 tablet by mouth 2 (two) times daily.      Marland Kitchen CETIRIZINE HCL 10 MG PO TABS Oral Take 5 mg by mouth 2 (two) times daily.     Marland Kitchen DICLOFENAC-MISOPROSTOL 75-200 MG-MCG PO TABS Oral Take 1 tablet by mouth 2 (two) times daily.      Marland Kitchen DIGOXIN 0.25 MG PO TABS Oral Take 250 mcg by mouth every morning.     Marland Kitchen DOCUSATE SODIUM-CASANTHRANOL 100-30 MG PO CAPS Oral Take 1 capsule by mouth 2 (two) times daily.      Marland Kitchen ESOMEPRAZOLE MAGNESIUM 40 MG PO CPDR Oral Take 1 capsule (40 mg total) by mouth daily before breakfast. 30 capsule 3  . FLUTICASONE PROPIONATE  HFA 44 MCG/ACT IN AERO Inhalation Inhale 1 puff into the lungs 2 (two) times daily.      . GUAIFENESIN ER 600 MG PO TB12 Oral Take 1,200 mg by mouth daily. OTC TAKE ONE TO TWO TABLETS IF NEEDED FOR CONGESTION     . LEVOTHYROXINE SODIUM 50 MCG PO TABS Oral Take 50 mcg by mouth every evening.     Marland Kitchen METOPROLOL SUCCINATE ER 50 MG PO TB24  50mg  every morning and 100mg  every evening.    Marland Kitchen NITROGLYCERIN 0.4 MG SL SUBL Sublingual Place 0.4 mg under the tongue every 5 (five) minutes as needed.      Marland Kitchen PANTOPRAZOLE SODIUM 40 MG PO TBEC  TAKE ONE TABLET BY MOUTH TWICE DAILY 60 tablet 3  . POLYETHYLENE GLYCOL 3350 PO PACK Oral Take 17 g by mouth daily.      Marland Kitchen POLYVINYL ALCOHOL-POVIDONE 5-6  MG/ML OP SOLN Both Eyes Place 1 drop into both eyes as directed.      Marland Kitchen PRASUGREL HCL 5 MG PO TABS      . SOYA LECITHIN 1200 MG PO CAPS Oral Take 1 tablet by mouth daily.      . SUCRALFATE 1 G PO TABS Oral Take 1 tablet (  1 g total) by mouth 4 (four) times daily. 40 tablet 0  . TRAMADOL HCL 50 MG PO TABS Oral Take 1 tablet by mouth Every 6 hours as needed. For pain      BP 151/72  Pulse 62  Temp(Src) 97.3 F (36.3 C) (Oral)  Resp 20  Ht 5\' 3"  (1.6 m)  Wt 144 lb (65.318 kg)  BMI 25.51 kg/m2  SpO2 99%  Physical Exam  Nursing note and vitals reviewed. Constitutional: She is oriented to person, place, and time.       Awake, alert, nontoxic appearance.  HENT:  Head: Atraumatic.  Eyes: Right eye exhibits no discharge. Left eye exhibits no discharge.  Neck: Neck supple.  Cardiovascular: Normal rate, regular rhythm and intact distal pulses.   Pulmonary/Chest: Effort normal. She exhibits no tenderness.  Abdominal: Soft. There is no tenderness. There is no rebound.  Musculoskeletal: She exhibits no tenderness.       Baseline ROM, no obvious new focal weakness.  Neurological: She is alert and oriented to person, place, and time. She has normal reflexes. No cranial nerve deficit.       Mental status and motor strength appears baseline for patient and situation.  Skin: No rash noted.  Psychiatric: She has a normal mood and affect.    ED Course  Procedures (including critical care time)     MDM  Patient with upper abdominal pain who has been found by CT to have a focal area of bowel thickening. She is scheduled for followup colonoscopy the first week of June. Given tramadol Zofran while here. Prescription for tramadol and Zofran. Patient is to followup with GI, Dr. Darrick Penna.Pt stable in ED with no significant deterioration in condition.The patient appears reasonably screened and/or stabilized for discharge and I doubt any other medical condition or other [facility] requiring further screening,  evaluation, or treatment in the ED at this time prior to discharge.  MDM Reviewed: nursing note, vitals and previous chart Reviewed previous: CT scan and labs           [facility]. Colon Branch, MD 11/28/11 [phone]

## 2011-11-28 NOTE — ED Notes (Signed)
Was seen here on Friday and had a CT scan, said my bowels were inflamed, hurting, had a terrible headache and have been unable to eat per pt. MD told me to come in this morning per pt.

## 2011-11-28 NOTE — Telephone Encounter (Signed)
Pt moved to 05/17

## 2011-11-28 NOTE — Telephone Encounter (Signed)
Pt called at 6am and left voicemail asking if she can take her nexium bid. Forwarding this to [place].

## 2011-11-28 NOTE — Telephone Encounter (Signed)
Patient had me paged at 0240 this morning.  Has been having abdominal pain which is in the process of being worked up. Recent ER and office visit. States pain this evening is more severe than it's ever been. It is unrelenting. She is putting heat on her abdominal wall without improvement. She states she can't stand it. I recommended she come to the emergency department immediately.

## 2011-11-28 NOTE — Discharge Instructions (Signed)
Take all of the antibiotic. Follow up with Dr. Darrick Penna. Use the pain and nausea medicine as needed.

## 2011-11-28 NOTE — Telephone Encounter (Signed)
AGREE

## 2011-11-28 NOTE — ED Notes (Signed)
Pt alert & oriented x4, stable gait. Pt given discharge instructions, paperwork & prescription(s). Pt verbalized understanding. Pt left department w/ no further questions.  

## 2011-11-28 NOTE — Telephone Encounter (Signed)
CALL PT AND OFFER HER TCS/EGD ON MAY 17.

## 2011-12-05 MED ORDER — SODIUM CHLORIDE 0.45 % IV SOLN
Freq: Once | INTRAVENOUS | Status: AC
  Administered 2011-12-06: 09:00:00 via INTRAVENOUS

## 2011-12-06 ENCOUNTER — Encounter (HOSPITAL_COMMUNITY)
Admission: RE | Disposition: A | Discharge status: Home / Self Care | Payer: Self-pay | Source: Ambulatory Visit | Attending: Gastroenterology

## 2011-12-06 ENCOUNTER — Encounter (HOSPITAL_COMMUNITY): Payer: Self-pay | Admitting: *Deleted

## 2011-12-06 ENCOUNTER — Ambulatory Visit (HOSPITAL_COMMUNITY)
Admission: RE | Admit: 2011-12-06 | Discharge: 2011-12-06 | Disposition: A | Discharge status: Home / Self Care | Payer: Medicare Other | Source: Ambulatory Visit | Attending: Gastroenterology | Admitting: Gastroenterology

## 2011-12-06 DIAGNOSIS — K319 Disease of stomach and duodenum, unspecified: Secondary | ICD-10-CM | POA: Insufficient documentation

## 2011-12-06 DIAGNOSIS — D126 Benign neoplasm of colon, unspecified: Secondary | ICD-10-CM | POA: Insufficient documentation

## 2011-12-06 DIAGNOSIS — R109 Unspecified abdominal pain: Secondary | ICD-10-CM | POA: Insufficient documentation

## 2011-12-06 DIAGNOSIS — K2990 Gastroduodenitis, unspecified, without bleeding: Secondary | ICD-10-CM

## 2011-12-06 DIAGNOSIS — E119 Type 2 diabetes mellitus without complications: Secondary | ICD-10-CM | POA: Insufficient documentation

## 2011-12-06 DIAGNOSIS — K648 Other hemorrhoids: Secondary | ICD-10-CM | POA: Insufficient documentation

## 2011-12-06 DIAGNOSIS — I10 Essential (primary) hypertension: Secondary | ICD-10-CM | POA: Insufficient documentation

## 2011-12-06 DIAGNOSIS — K2970 Gastritis, unspecified, without bleeding: Secondary | ICD-10-CM | POA: Insufficient documentation

## 2011-12-06 DIAGNOSIS — R634 Abnormal weight loss: Secondary | ICD-10-CM

## 2011-12-06 DIAGNOSIS — K5730 Diverticulosis of large intestine without perforation or abscess without bleeding: Secondary | ICD-10-CM | POA: Insufficient documentation

## 2011-12-06 DIAGNOSIS — K449 Diaphragmatic hernia without obstruction or gangrene: Secondary | ICD-10-CM | POA: Insufficient documentation

## 2011-12-06 HISTORY — PX: OTHER SURGICAL HISTORY: SHX169

## 2011-12-06 HISTORY — PX: ESOPHAGOGASTRODUODENOSCOPY: SHX1529

## 2011-12-06 SURGERY — COLONOSCOPY WITH ESOPHAGOGASTRODUODENOSCOPY (EGD)
Anesthesia: Moderate Sedation

## 2011-12-06 MED ORDER — MEPERIDINE HCL 100 MG/ML IJ SOLN
INTRAMUSCULAR | Status: DC | PRN
  Administered 2011-12-06 (×3): 25 mg via INTRAVENOUS

## 2011-12-06 MED ORDER — ESOMEPRAZOLE MAGNESIUM 40 MG PO CPDR: DELAYED_RELEASE_CAPSULE | ORAL | Status: DC

## 2011-12-06 MED ORDER — MIDAZOLAM HCL 5 MG/5ML IJ SOLN
INTRAMUSCULAR | Status: DC | PRN
  Administered 2011-12-06: 1 mg via INTRAVENOUS
  Administered 2011-12-06: 2 mg via INTRAVENOUS
  Administered 2011-12-06 (×2): 1 mg via INTRAVENOUS

## 2011-12-06 MED ORDER — MIDAZOLAM HCL 5 MG/5ML IJ SOLN
INTRAMUSCULAR | Status: AC
  Filled 2011-12-06: qty 5

## 2011-12-06 MED ORDER — STERILE WATER FOR IRRIGATION IR SOLN
Status: DC | PRN
  Administered 2011-12-06: 10:00:00

## 2011-12-06 MED ORDER — MEPERIDINE HCL 100 MG/ML IJ SOLN
INTRAMUSCULAR | Status: AC
  Filled 2011-12-06: qty 1

## 2011-12-06 MED ORDER — BUTAMBEN-TETRACAINE-BENZOCAINE 2-2-14 % EX AERO
INHALATION_SPRAY | CUTANEOUS | Status: DC | PRN
  Administered 2011-12-06: 2 via TOPICAL

## 2011-12-06 NOTE — H&P (Signed)
Primary Care Physician:  Josue Hector, MD, MD Primary Gastroenterologist:  Dr. Darrick Penna  Pre-Procedure History & Physical: HPI:  Katie Ford is a 78 y.o. female here for ABDOMINAL  PAIN/WEIGHT LOSS.   Past Medical History  Diagnosis Date  . Ejection fraction     EF 65 %... normal... catheterization 02/2010.  . Mitral regurgitation     mild, echo, June, 2007  . Paroxysmal supraventricular tachycardia     infrequent episodes over the years..palpitations.  . Hypertension   . Asthmatic bronchitis   . Obesity   . CAD (coronary artery disease)     bare metal stents to 2 separate RCA lesions August, 2011, residual 60% LAD, medical therapy  /  plan was for effient for one month followed by Plavix,, but patient does not tolerate Plavix... therefore patient placed back on effient  . GERD (gastroesophageal reflux disease)   . Edema 03/29/2010    September, 2011  . Intolerance of drug     aspirin and plavix  . Hypothyroidism 04/24/2011  . Hyperglycemia 04/24/2011  . Fracture of rib of left side 04/24/2011  . Hip fracture     October, 2012  . Diabetes mellitus   . Diverticulitis     remote hx    Past Surgical History  Procedure Date  . Knee arthroscopy 11/2001    left knee  . Laparoscopic cholecystectomy 04/29/1996    [facility]  . Tubal ligation 09/1970  . Ankle fracture [place] 12/1993    [place]  . Sp arthro thumb*r* 05/1989    [place]  . Total abdominal hysterectomy 1983    [facility]  . Cardiac catheterization 02/22/2010    2 stents, [facility]  . Knee arthroscopy 02/19/2011    Procedure: ARTHROSCOPY KNEE;  Surgeon: Darreld Mclean;  Location: [facility];  Service: Orthopedics;  Laterality: Right;  Medial and Lateral Partial Menisectomy  . Arthroplasty w/ arthroscopy medial / lateral compartment knee     [facility], should read arthroscopy not arthroplasty  . Orif hip fracture 04/25/2011    Procedure: OPEN REDUCTION INTERNAL FIXATION HIP;  Surgeon: Darreld Mclean;  Location: [facility];  Service:  Orthopedics;  Laterality: Left;    Prior to Admission medications   Medication Sig Start Date End Date Taking? Authorizing Provider  albuterol (PROAIR HFA) 108 (90 BASE) MCG/ACT inhaler Inhale 2 puffs into the lungs every 6 (six) hours as needed. FOR SHORTNESS OF BREATH   Yes Historical Provider, MD  ALPRAZolam (XANAX) 0.5 MG tablet TAKE HALF TO ONE  A TABLET IF NEEDED FOR ANXIETY    Yes Historical Provider, MD  amLODipine (NORVASC) 10 MG tablet Take 10 mg by mouth every morning.    Yes Historical Provider, MD  calcium-vitamin D (OSCAL WITH D) 250-125 MG-UNIT per tablet Take 1 tablet by mouth 2 (two) times daily.     Yes Historical Provider, MD  cetirizine (ZYRTEC) 10 MG tablet Take 5 mg by mouth 2 (two) times daily.    Yes Historical Provider, MD  diclofenac-misoprostol (ARTHROTEC 75) 75-0.2 MG per tablet Take 1 tablet by mouth 2 (two) times daily.     Yes Historical Provider, MD  digoxin (LANOXIN) 0.25 MG tablet Take 250 mcg by mouth every morning.    Yes Historical Provider, MD  docusate-casanthranol (PERICOLACE) 100-30 MG per capsule Take 1 capsule by mouth 2 (two) times daily.     Yes Historical Provider, MD  esomeprazole (NEXIUM) 40 MG capsule Take 1 capsule (40 mg total) by mouth daily before breakfast. 11/25/11 11/24/12  Yes Nira Retort, NP  fluticasone (FLOVENT HFA) 44 MCG/ACT inhaler Inhale 1 puff into the lungs 2 (two) times daily.     Yes Historical Provider, MD  levothyroxine (SYNTHROID, LEVOTHROID) 50 MCG tablet Take 50 mcg by mouth every evening.    Yes Historical Provider, MD  metoprolol (TOPROL-XL) 50 MG 24 hr tablet 50mg  every morning and 100mg  every evening.   Yes Historical Provider, MD  metroNIDAZOLE (FLAGYL) 500 MG tablet Take 1 tablet (500 mg total) by mouth 2 (two) times daily. 11/28/11 12/08/11 Yes Terry S. Colon Branch, MD  oxyCODONE-acetaminophen (PERCOCET) 5-325 MG per tablet Take 1/2 to 1 tablet every 6 hours as needed for pain. 11/28/11  Yes Nicoletta Dress. Colon Branch, MD  polyethylene glycol  (MIRALAX / GLYCOLAX) packet Take 17 g by mouth daily.     Yes Historical Provider, MD  Polyvinyl Alcohol-Povidone (MURINE TEARS FOR DRY EYES) 5-6 MG/ML SOLN Place 1 drop into both eyes as directed.     Yes Historical Provider, MD  Soya Lecithin 1200 MG CAPS Take 1 tablet by mouth daily.     Yes Historical Provider, MD  traMADol (ULTRAM) 50 MG tablet Take 1 tablet (50 mg total) by mouth every 6 (six) hours as needed for pain. 11/28/11 12/08/11 Yes Terry S. Colon Branch, MD  guaiFENesin (MUCINEX) 600 MG 12 hr tablet Take 1,200 mg by mouth daily. OTC TAKE ONE TO TWO TABLETS IF NEEDED FOR CONGESTION     Historical Provider, MD  nitroGLYCERIN (NITROSTAT) 0.4 MG SL tablet Place 0.4 mg under the tongue every 5 (five) minutes as needed.      Historical Provider, MD  ondansetron (ZOFRAN) 4 MG tablet Take 1 tablet (4 mg total) by mouth every 6 (six) hours. 11/28/11 12/05/11  Nicoletta Dress. Colon Branch, MD  pantoprazole (PROTONIX) 40 MG tablet TAKE ONE TABLET BY MOUTH TWICE DAILY 01/15/11   Luis Abed, MD  prasugrel (EFFIENT) 5 MG TABS  09/10/11   Luis Abed, MD  sucralfate (CARAFATE) 1 G tablet Take 1 tablet (1 g total) by mouth 4 (four) times daily. 11/22/11 11/21/12  Benny Lennert, MD    Allergies as of 11/25/2011 - Review Complete 11/25/2011  Allergen Reaction Noted  . Celecoxib Other (See Comments)   . Codeine Other (See Comments)   . Fluticasone-salmeterol Other (See Comments)   . Hydrocodone Other (See Comments)   . Tartrazine Other (See Comments) 02/05/2011    Family History  Problem Relation Age of Onset  . Diabetes Mother   . Arthritis Mother   . Heart attack Father   . Stroke Father   . Emphysema Father   . Cataracts Father   . Cancer Sister   . Heart disease Sister   . Arthritis Sister   . Arthritis Brother   . Allergies Brother   . Colon cancer Neg Hx     History   Social History  . Marital Status: Widowed    Spouse Name: N/A    Number of Children: N/A  . Years of Education: N/A    Occupational History  . [employer]     Engineer, site   Social History Main Topics  . Smoking status: Never Smoker   . Smokeless tobacco: Never Used  . Alcohol Use: No     rarely  . Drug Use: No  . Sexually Active: No   Other Topics Concern  . Not on file   Social History Narrative   Retired Engineer, site    Review of Systems: See  HPI, otherwise negative ROS   Physical Exam: BP 169/87  Pulse 76  Temp(Src) 98 F (36.7 C) (Oral)  Resp 18  Ht 5\' 3"  (1.6 m)  Wt 144 lb (65.318 kg)  BMI 25.51 kg/m2  SpO2 99% General:   Alert,  pleasant and cooperative in NAD Head:  Normocephalic and atraumatic. Neck:  Supple;  Lungs:  Clear throughout to auscultation.    Heart:  Regular rate and rhythm. Abdomen:  Soft, nontender and nondistended. Normal bowel sounds, without guarding, and without rebound.   Neurologic:  Alert and  oriented x4;  grossly normal neurologically.  Impression/Plan:     ABDOMINAL PAIN/WEIGHT LOSS UNINTENTIONAL  PLAN:  EGD/TCS TODAY

## 2011-12-06 NOTE — Op Note (Addendum)
[facility] [address] [place], [place]  [address]  COLONOSCOPY PROCEDURE REPORT  PATIENT:  Katie Ford, Katie Ford  MR#:  [id number] BIRTHDATE:  1932-07-17, 79 yrs. old  GENDER:  female  ENDOSCOPIST:  Jonette Eva, MD REF. BY:  Joette Catching, M.D. ASSISTANT:  PROCEDURE DATE:  12/06/2011 PROCEDURE:  ILEOColonoscopy with snare polypectomy  INDICATIONS:  Abdominal pain AFTER TAKING HIGH DOSES OF AMOXICILLIN, weight loss AFTER BEING AT THE [facility] FOR 3 MOS-DID NOT WANT TO EAT THEIR FOOD.  MEDICATIONS:   Demerol 50 mg IV, Versed 3 mg IV  DESCRIPTION OF PROCEDURE:    Physical exam was performed. Informed consent was obtained from the patient after explaining the benefits, risks, and alternatives to procedure.  The patient was connected to monitor and placed in left lateral position. Continuous oxygen was provided by nasal cannula and IV medicine administered through an indwelling cannula.  After administration of sedation and rectal exam, the patient's rectum was intubated and the EC-3890Li ([id number]) colonoscope was advanced under direct visualization to the ILEUM.  The scope was removed slowly by carefully examining the color, texture, anatomy, and integrity mucosa on the way out.  The patient was recovered in endoscopy and discharged home in satisfactory condition. <<PROCEDUREIMAGES>>  FINDINGS:  A 6 MM sessile polypOID was found at the hepatic flexure & REMOVED VIA SNARE CAUTERY.  FREQUENT Diverticula were found throughout the colon.  SMALL Internal Hemorrhoids were found.  PREP QUALITY: GOOD CECAL W/D TIME:    20 minutes  COMPLICATIONS:    None  ENDOSCOPIC IMPRESSION: 1) Sessile polyp at the hepatic flexure 2) Diverticula throughout the colon 3) Internal hemorrhoids  RECOMMENDATIONS: Await biopsies High fiber diet TCS IN 10-15 YEARS IF THE BENEFITS OUTWEIGHT THE RISKS  REPEAT EXAM:  No  ______________________________ Jonette Eva, MD  CC:   Joette Catching, M.D.  n. REVISED:  12/06/2011 11:30 AM eSIGNED:   Duncan Dull See Beharry at 12/06/2011 11:30 AM  [place], [place], [phone]

## 2011-12-06 NOTE — Discharge Instructions (Signed)
YOUR UPPER ENDOSCOPY SHOWED A HIATAL HERNIA AND GASTRITIS, DUE TO ARTHROTEC. YOU HAD ONE POLYP REMOVED. You have internal hemorrhoids 7 DIVERTICULOSIS THROUGHOUT YOUR COLON. I biopsied your stomach.    Take Nexium 30 minutes prior to meals twice daily.  Stop EFFIENT. RESTART MAY 23.  Drink ENSURE, BOOST, OR CARNATION INSTANT BREAKFAST THREE TIMES A DAY.  AVOID ITEMS THAT TRIGGER GASTRITIS. SEE INFO BELOW.  FOLLOW A HIGH FIBER. AVOID ITEMS THAT CAUSE BLOATING. SEE INFO BELOW.  YOUR BIOPSY RESULTS WILL BE BACK IN 7 DAYS.  FOLLOW UP IN 3 MOS.  Aug 16th at 10:00am   ENDOSCOPY Care After Read the instructions outlined below and refer to this sheet in the next week. These discharge instructions provide you with general information on caring for yourself after you leave the hospital. While your treatment has been planned according to the most current medical practices available, unavoidable complications occasionally occur. If you have any problems or questions after discharge, call DR. Raychel Dowler, [phone].  ACTIVITY  You may resume your regular activity, but move at a slower pace for the next 24 hours.   Take frequent rest periods for the next 24 hours.   Walking will help get rid of the air and reduce the bloated feeling in your belly (abdomen).   No driving for 24 hours (because of the medicine (anesthesia) used during the test).   You may shower.   Do not sign any important legal documents or operate any machinery for 24 hours (because of the anesthesia used during the test).    NUTRITION  Drink plenty of fluids.   You may resume your normal diet as instructed by your doctor.   Begin with a light meal and progress to your normal diet. Heavy or fried foods are harder to digest and may make you feel sick to your stomach (nauseated).   Avoid alcoholic beverages for 24 hours or as instructed.    MEDICATIONS  You may resume your normal medications.   WHAT YOU CAN EXPECT  TODAY  Some feelings of bloating in the abdomen.   Passage of more gas than usual.   Spotting of blood in your stool or on the toilet paper  .  IF YOU HAD POLYPS REMOVED DURING THE ENDOSCOPY:  Eat a soft diet IF YOU HAVE NAUSEA, BLOATING, ABDOMINAL PAIN, OR VOMITING.    FINDING OUT THE RESULTS OF YOUR TEST Not all test results are available during your visit. DR. Darrick Penna WILL CALL YOU WITHIN 7 DAYS OF YOUR PROCEDUE WITH YOUR RESULTS. Do not assume everything is normal if you have not heard from DR. Yolinda Duerr IN ONE WEEK, CALL HER OFFICE AT [phone].  SEEK IMMEDIATE MEDICAL ATTENTION AND CALL THE OFFICE: [phone] IF:  You have more than a spotting of blood in your stool.   Your belly is swollen (abdominal distention).   You are nauseated or vomiting.   You have a temperature over 101F.   You have abdominal pain or discomfort that is severe or gets worse throughout the day.  Polyps, Colon  A polyp is extra tissue that grows inside your body. Colon polyps grow in the large intestine. The large intestine, also called the colon, is part of your digestive system. It is a long, hollow tube at the end of your digestive tract where your body makes and stores stool. Most polyps are not dangerous. They are benign. This means they are not cancerous. But over time, some types of polyps can turn into cancer. Polyps  that are smaller than a pea are usually not harmful. But larger polyps could someday become or may already be cancerous. To be safe, doctors remove all polyps and test them.   WHO GETS POLYPS? Anyone can get polyps, but certain people are more likely than others. You may have a greater chance of getting polyps if:  You are over 50.   You have had polyps before.   Someone in your family has had polyps.   Someone in your family has had cancer of the large intestine.   Find out if someone in your family has had polyps. You may also be more likely to get polyps if you:    Eat a lot of fatty foods   Smoke   Drink alcohol   Do not exercise  Eat too much   TREATMENT  The caregiver will remove the polyp during sigmoidoscopy or colonoscopy.  PREVENTION There is not one sure way to prevent polyps. You might be able to lower your risk of getting them if you:  Eat more fruits and vegetables and less fatty food.   Do not smoke.   Avoid alcohol.   Exercise every day.   Lose weight if you are overweight.   Eating more calcium and folate can also lower your risk of getting polyps. Some foods that are rich in calcium are milk, cheese, and broccoli. Some foods that are rich in folate are chickpeas, kidney beans, and spinach.    Gastritis  Gastritis is an inflammation (the body's way of reacting to injury and/or infection) of the stomach. It is often caused by bacterial (germ) infections. It can also be caused BY ASPIRIN, BC/GOODY POWDER'S, (IBUPROFEN) MOTRIN, OR ALEVE (NAPROXEN), chemicals (including alcohol), SPICY FOODS, and medications. This illness may be associated with generalized malaise (feeling tired, not well), UPPER ABDOMINAL STOMACH cramps, and fever. One common bacterial cause of gastritis is an organism known as H. Pylori. This can be treated with antibiotics.     Hiatal Hernia A hiatal hernia occurs when a part of the stomach slides above the diaphragm. The diaphragm is the thin muscle separating the belly (abdomen) from the chest. A hiatal hernia can be something you are born with or develop over time. Hiatal hernias may allow stomach acid to flow back into your esophagus, the tube which carries food from your mouth to your stomach. If this acid causes problems it is called GERD (gastro-esophageal reflux disease).   SYMPTOMS Common symptoms of GERD are heartburn (burning in your chest). This is worse when lying down or bending over. It may also cause belching and indigestion. Some of the things which make GERD worse are:  Increased weight  pushes on stomach making acid rise more easily.   Smoking markedly increases acid production.   Alcohol decreases lower esophageal sphincter pressure (valve between stomach and esophagus), allowing acid from stomach into esophagus.   Late evening meals and going to bed with a full stomach increases pressure.   Anything that causes an increase in acid production.    HOME CARE INSTRUCTIONS  Try to achieve and maintain an ideal body weight.   Avoid drinking alcoholic beverages.   DO NOT smokE.   Do not wear tight clothing around your chest or stomach.   Eat smaller meals and eat more frequently. This keeps your stomach from getting too full. Eat slowly.   Do not lie down for 2 or 3 hours after eating. Do not eat or drink anything 1 to 2  hours before going to bed.   Avoid caffeine beverages (colas, coffee, cocoa, tea), fatty foods, citrus fruits and all other foods and drinks that contain acid and that seem to increase the problems.   Avoid bending over, especially after eating OR STRAINING. Anything that increases the pressure in your belly increases the amount of acid that may be pushed up into your esophagus.    High-Fiber Diet A high-fiber diet changes your normal diet to include more whole grains, legumes, fruits, and vegetables. Changes in the diet involve replacing refined carbohydrates with unrefined foods. The calorie level of the diet is essentially unchanged. The Dietary Reference Intake (recommended amount) for adult males is 38 grams per day. For adult females, it is 25 grams per day. Pregnant and lactating women should consume 28 grams of fiber per day. Fiber is the intact part of a plant that is not broken down during digestion. Functional fiber is fiber that has been isolated from the plant to provide a beneficial effect in the body. PURPOSE  Increase stool bulk.   Ease and regulate bowel movements.   Lower cholesterol.  INDICATIONS THAT YOU NEED MORE  FIBER  Constipation and hemorrhoids.   Uncomplicated diverticulosis (intestine condition) and irritable bowel syndrome.   Weight management.   As a protective measure against hardening of the arteries (atherosclerosis), diabetes, and cancer.   GUIDELINES FOR INCREASING FIBER IN THE DIET  Start adding fiber to the diet slowly. A gradual increase of about 5 more grams (2 slices of whole-wheat bread, 2 servings of most fruits or vegetables, or 1 bowl of high-fiber cereal) per day is best. Too rapid an increase in fiber may result in constipation, flatulence, and bloating.   Drink enough water and fluids to keep your urine clear or pale yellow. Water, juice, or caffeine-free drinks are recommended. Not drinking enough fluid may cause constipation.   Eat a variety of high-fiber foods rather than one type of fiber.   Try to increase your intake of fiber through using high-fiber foods rather than fiber pills or supplements that contain small amounts of fiber.   The goal is to change the types of food eaten. Do not supplement your present diet with high-fiber foods, but replace foods in your present diet.  INCLUDE A VARIETY OF FIBER SOURCES  Replace refined and processed grains with whole grains, canned fruits with fresh fruits, and incorporate other fiber sources. White rice, white breads, and most bakery goods contain little or no fiber.   Brown whole-grain rice, buckwheat oats, and many fruits and vegetables are all good sources of fiber. These include: broccoli, Brussels sprouts, cabbage, cauliflower, beets, sweet potatoes, white potatoes (skin on), carrots, tomatoes, eggplant, squash, berries, fresh fruits, and dried fruits.   Cereals appear to be the richest source of fiber. Cereal fiber is found in whole grains and bran. Bran is the fiber-rich outer coat of cereal grain, which is largely removed in refining. In whole-grain cereals, the bran remains. In breakfast cereals, the largest amount  of fiber is found in those with "bran" in their names. The fiber content is sometimes indicated on the label.   You may need to include additional fruits and vegetables each day.   In baking, for 1 cup white flour, you may use the following substitutions:   1 cup whole-wheat flour minus 2 tablespoons.   1/2 cup white flour plus 1/2 cup whole-wheat flour.    Diverticulosis Diverticulosis is a common condition that develops when small pouches (diverticula)  form in the wall of the colon. The risk of diverticulosis increases with age. It happens more often in people who eat a low-fiber diet. Most individuals with diverticulosis have no symptoms. Those individuals with symptoms usually experience belly (abdominal) pain, constipation, or loose stools (diarrhea).  HOME CARE INSTRUCTIONS  Increase the amount of fiber in your diet as directed by your caregiver or dietician. This may reduce symptoms of diverticulosis.   Drink at least 6 to 8 glasses of water each day to prevent constipation.   Try not to strain when you have a bowel movement.   Avoiding nuts and seeds to prevent complications is still an uncertain benefit.       FOODS HAVING HIGH FIBER CONTENT INCLUDE:  Fruits. Apple, peach, pear, tangerine, raisins, prunes.   Vegetables. Brussels sprouts, asparagus, broccoli, cabbage, carrot, cauliflower, romaine lettuce, spinach, summer squash, tomato, winter squash, zucchini.   Starchy Vegetables. Baked beans, kidney beans, lima beans, split peas, lentils, potatoes (with skin).   Grains. Whole wheat bread, brown rice, bran flake cereal, plain oatmeal, white rice, shredded wheat, bran muffins.    SEEK IMMEDIATE MEDICAL CARE IF:  You develop increasing pain or severe bloating.   You have an oral temperature above 101F.   You develop vomiting or bowel movements that are bloody or black.    Hemorrhoids Hemorrhoids are dilated (enlarged) veins around the rectum. Sometimes clots  will form in the veins. This makes them swollen and painful. These are called thrombosed hemorrhoids. Causes of hemorrhoids include:  Constipation.   Straining to have a bowel movement.   HEAVY LIFTING HOME CARE INSTRUCTIONS  Eat a well balanced diet and drink 6 to 8 glasses of water every day to avoid constipation. You may also use a bulk laxative.   Avoid straining to have bowel movements.   Keep anal area dry and clean.   Do not use a donut shaped pillow or sit on the toilet for long periods. This increases blood pooling and pain.   Move your bowels when your body has the urge; this will require less straining and will decrease pain and pressure.

## 2011-12-06 NOTE — Op Note (Signed)
[facility] [address] [place], [place]  [address]  ENDOSCOPY PROCEDURE REPORT  PATIENT:  Katie Ford, Katie Ford  MR#:  [id number] BIRTHDATE:  Jan 09, 1932, 79 yrs. old  GENDER:  female  ENDOSCOPIST:  Jonette Eva, MD Referred by:  Joette Catching, M.D.  PROCEDURE DATE:  12/06/2011 PROCEDURE:  EGD with biopsy, 432[address] ASA CLASS: INDICATIONS:  ABDOMINAL PAIN AFTER AMOXICILLIN. TAKING ARTHROTEC.  WEIGHT LOSS AFTER 3 MO STAY AT THE [facility]. DIDN'T LIKE THEIR FOOD. EATS SMALL MEALS.  MEDICATIONS:   Demerol 25 mg IV, Versed 2 mg IV TOPICAL ANESTHETIC:  Cetacaine Spray  DESCRIPTION OF PROCEDURE:     Physical exam was performed. Informed consent was obtained from the patient after explaining the benefits, risks, and alternatives to the procedure.  The patient was connected to the monitor and placed in the left lateral position.  Continuous oxygen was provided by nasal cannula and IV medicine administered through an indwelling cannula.  After administration of sedation, the patient's esophagus was intubated and the EC-3890Li ([id number]) and [id number] ([id number]) endoscope was advanced under direct visualization to the second portion of the duodenum.  The scope was removed slowly by carefully examining the color, texture, anatomy, and integrity of the mucosa on the way out.  The patient was recovered in endoscopy and discharged home in satisfactory condition. <<PROCEDUREIMAGES>>  A 2-3 CM hiatal hernia was found.  Moderate gastritis was found  BIOPSIED VIA COLD FORCEPS.Marland Kitchen NL ESOPHAGUS. NO BARRETT''S. NL. DIFFICULT TO PASS SCOPE INTO SECOND PORTION OF DUODENUM. POSSIBLE NARROWING AT D1/D2 OTHERWISE NL DUODENUM.  COMPLICATIONS:    None  ENDOSCOPIC IMPRESSION: 1) Hiatal hernia 2) Moderate gastritis  RECOMMENDATIONS: AWAIT BIOPSIES NEXIUM BID RESTART EFFIENT MAY 23. ENSURE/BOOST/CIB TID OPV IN 3 MOS.  REPEAT EXAM:  No  ______________________________ Jonette Eva,  MD  CC:  n. eSIGNED:   Deshia Vanderhoof at 12/06/2011 11:40 AM  [place], [place], [phone]

## 2011-12-11 ENCOUNTER — Telehealth: Payer: Self-pay | Admitting: Gastroenterology

## 2011-12-11 NOTE — Telephone Encounter (Signed)
Please call pt. Katie Ford stomach Bx shows gastritis. Katie Ford COLON BIOPSY WAS BENIGN.   Take Nexium 30 minutes prior to meals twice daily.  Stop EFFIENT. RESTART MAY 23.  Drink ENSURE, BOOST, OR CARNATION INSTANT BREAKFAST THREE TIMES A DAY.  AVOID ITEMS THAT TRIGGER GASTRITIS.   FOLLOW A HIGH FIBER. AVOID ITEMS THAT CAUSE BLOATING.   FOLLOW UP IN 3 MOS.  Aug 16th at 10:00am

## 2011-12-12 NOTE — Telephone Encounter (Signed)
Results Cc to PCP & reminder is in the computer 

## 2011-12-12 NOTE — Telephone Encounter (Signed)
Called and informed pt.  

## 2012-01-29 NOTE — Progress Notes (Signed)
EGD/TCS MAY 2013 GASTRITIS/NL COLON

## 2012-03-05 ENCOUNTER — Encounter: Payer: Self-pay | Admitting: Gastroenterology

## 2012-03-06 ENCOUNTER — Encounter: Payer: Self-pay | Admitting: Urgent Care

## 2012-03-06 ENCOUNTER — Ambulatory Visit (INDEPENDENT_AMBULATORY_CARE_PROVIDER_SITE_OTHER): Payer: Medicare Other | Admitting: Urgent Care

## 2012-03-06 VITALS — BP 153/80 | HR 62 | Temp 98.1°F | Ht 63.0 in | Wt 144.8 lb

## 2012-03-06 DIAGNOSIS — K219 Gastro-esophageal reflux disease without esophagitis: Secondary | ICD-10-CM

## 2012-03-06 DIAGNOSIS — K5730 Diverticulosis of large intestine without perforation or abscess without bleeding: Secondary | ICD-10-CM

## 2012-03-06 DIAGNOSIS — K5790 Diverticulosis of intestine, part unspecified, without perforation or abscess without bleeding: Secondary | ICD-10-CM | POA: Insufficient documentation

## 2012-03-06 NOTE — Progress Notes (Signed)
Referring Provider: Josue Hector,* Primary Care Physician:  Josue Hector, MD Primary Gastroenterologist:  Dr. Jonette Eva  Chief Complaint  Patient presents with  . Follow-up    GERD, gastritis,diverticulosis    HPI:  [place] B Oliveria is a 78 y.o. female here for follow up for GERD, non-H. Pylori gastritis and diverticulosis.  She presented with abdominal pain in May 2013. She underwent colonoscopy and EGD by what Dr. Darrick Penna with results below. Her pain is 100% resolved. She denies any problemswith heartburn indigestion on Nexium 40 mg twice a day. She takes occasional dose of MiraLax but really has not had to take that recently.  Her appetite has returned to normal. She is having normal bowel movements.  Past Medical History  Diagnosis Date  . Ejection fraction     EF 65 %... normal... catheterization 02/2010.  . Mitral regurgitation     mild, echo, June, 2007  . Paroxysmal supraventricular tachycardia     infrequent episodes over the years..palpitations.  . Hypertension   . Asthmatic bronchitis   . Obesity   . CAD (coronary artery disease)     bare metal stents to 2 separate RCA lesions August, 2011, residual 60% LAD, medical therapy  /  plan was for effient for one month followed by Plavix,, but patient does not tolerate Plavix... therefore patient placed back on effient  . GERD (gastroesophageal reflux disease)   . Edema 03/29/2010    September, 2011  . Intolerance of drug     aspirin and plavix  . Hypothyroidism 04/24/2011  . Hyperglycemia 04/24/2011  . Fracture of rib of left side 04/24/2011  . Hip fracture     October, 2012  . Diabetes mellitus   . Diverticulitis     remote hx    Past Surgical History  Procedure Date  . Knee arthroscopy 11/2001    left knee  . Laparoscopic cholecystectomy 04/29/1996    [facility]  . Tubal ligation 09/1970  . [place]  . [place]  . [facility] abdominal  hysterectomy 1983    [facility]  . Cardiac catheterization 02/22/2010    2 stents, [facility]  . Knee arthroscopy 02/19/2011    Procedure: ARTHROSCOPY KNEE;  Surgeon: Darreld Mclean;  Location: [facility];  Service: Orthopedics;  Laterality: Right;  Medial and Lateral Partial Menisectomy  . Arthroplasty w/ arthroscopy medial / lateral compartment knee     [facility], should read arthroscopy not arthroplasty  . Orif hip fracture 04/25/2011    Procedure: OPEN REDUCTION INTERNAL FIXATION HIP;  Surgeon: Darreld Mclean;  Location: [facility];  Service: Orthopedics;  Laterality: Left;  . Ileocolonscopy 12/06/11    fields:diverticula throughtout the colon/inertnal hemorrhoids/  . Esophagogastroduodenoscopy 12/06/11    fields: Negative H. pylori, hiatal hernia/moderate gastritis    Current Outpatient Prescriptions  Medication Sig Dispense Refill  . albuterol (PROAIR HFA) 108 (90 BASE) MCG/ACT inhaler Inhale 2 puffs into the lungs every 6 (six) hours as needed. FOR SHORTNESS OF BREATH      . ALPRAZolam (XANAX) 0.5 MG tablet TAKE HALF TO ONE  A TABLET IF NEEDED FOR ANXIETY       . amLODipine (NORVASC) 10 MG tablet Take 10 mg by mouth every morning.       . calcium-vitamin D (OSCAL WITH D) 250-125 MG-UNIT per tablet Take 1 tablet by mouth 2 (two) times daily.        . cetirizine (ZYRTEC) 10 MG  tablet Take 5 mg by mouth 2 (two) times daily.       . diclofenac-misoprostol (ARTHROTEC 75) 75-0.2 MG per tablet Take 1 tablet by mouth 2 (two) times daily.        . digoxin (LANOXIN) 0.25 MG tablet Take 250 mcg by mouth every morning.       . docusate-casanthranol (PERICOLACE) 100-30 MG per capsule Take 1 capsule by mouth 2 (two) times daily.        Marland Kitchen EFFIENT 5 MG TABS Take 5 mg by mouth daily.       Marland Kitchen esomeprazole (NEXIUM) 40 MG capsule 1 po with breakfast and supper  62 capsule  11  . fluticasone (FLOVENT HFA) 44 MCG/ACT inhaler Inhale 1 puff into the lungs 2 (two) times daily.        Marland Kitchen guaiFENesin (MUCINEX) 600 MG 12 hr tablet Take 1,200  mg by mouth daily. OTC TAKE ONE TO TWO TABLETS IF NEEDED FOR CONGESTION       . levothyroxine (SYNTHROID, LEVOTHROID) 50 MCG tablet Take 50 mcg by mouth every evening.       . metoprolol (TOPROL-XL) 50 MG 24 hr tablet 50mg  every morning and 100mg  every evening.      . nitroGLYCERIN (NITROSTAT) 0.4 MG SL tablet Place 0.4 mg under the tongue every 5 (five) minutes as needed.        . Polyvinyl Alcohol-Povidone (MURINE TEARS FOR DRY EYES) 5-6 MG/ML SOLN Place 1 drop into both eyes as directed.          Allergies as of 03/06/2012 - Review Complete 03/06/2012  Allergen Reaction Noted  . Celecoxib Other (See Comments)   . Codeine Other (See Comments)   . Fluticasone-salmeterol Other (See Comments)   . Hydrocodone Other (See Comments)   . Tartrazine Other (See Comments) 02/05/2011    Review of Systems: Gen: Denies any fever, chills, sweats, anorexia, fatigue, weakness, malaise, weight loss, and sleep disorder CV: Denies chest pain, angina, palpitations, syncope, orthopnea, PND, peripheral edema, and claudication. Resp: some wheezing with asthma. GI: Denies vomiting blood, jaundice, and fecal incontinence.   Denies dysphagia or odynophagia. Derm: Denies rash, itching, dry skin, hives, moles, warts, or unhealing ulcers.  Psych: Denies depression, anxiety, memory loss, suicidal ideation, hallucinations, paranoia, and confusion. Heme: Denies bruising, bleeding, and enlarged lymph nodes. Muscular skeletal: Left lateral hip and thigh pain at site of previous surgical repair for the last 24 hours. She will discuss this with Dr. Romeo Apple if it persists.  Physical Exam: BP 153/80  Pulse 62  Temp 98.1 F (36.7 C) (Temporal)  Ht 5\' 3"  (1.6 m)  Wt 144 lb 12.8 oz (65.681 kg)  BMI 25.65 kg/m2 General:   Alert,  Well-developed, well-nourished, pleasant, jovial and cooperative in NAD.  Accompanied by her sister today.ambulates with cane. Eyes:  Sclera clear, no icterus.   Conjunctiva pink. Mouth:  No  deformity or lesions, oropharynx pink and moist. Neck:  Supple; no masses or thyromegaly. Heart:  Regular rate and rhythm; no murmurs, clicks, rubs,  or gallops. Abdomen:  Normal bowel sounds.  No bruits.  Soft, non-tender and non-distended without masses, hepatosplenomegaly or hernias noted.  No guarding or rebound tenderness.   Rectal:  Deferred. Msk:  Symmetrical without gross deformities.  Pulses:  Normal pulses noted. Extremities:  No clubbing or edema. Neurologic:  Alert and oriented x4;  grossly normal neurologically. Skin:  Intact without significant lesions or rashes.

## 2012-03-06 NOTE — Progress Notes (Signed)
Faxed to PCP

## 2012-03-06 NOTE — Assessment & Plan Note (Signed)
Continue high-fiber diet. High fiber diet literature given.

## 2012-03-06 NOTE — Assessment & Plan Note (Addendum)
Controlled on Nexium 40 mg twice a day. She is also on effient and she should continue this for gastric protection.  She may be able to decrease to once daily Nexium in the near future.  Office visit in 6 months with Dr. Darrick Penna.

## 2012-03-06 NOTE — Patient Instructions (Addendum)
Continue Nexium 40 mg before breakfast and dinner High fiber diet High Fiber Diet A high fiber diet changes your normal diet to include more whole grains, legumes, fruits, and vegetables. Changes in the diet involve replacing refined carbohydrates with unrefined foods. The calorie level of the diet is essentially unchanged. The Dietary Reference Intake (recommended amount) for adult males is 38 g per day. For adult females, it is 25 g per day. Pregnant and lactating women should consume 28 g of fiber per day. Fiber is the intact part of a plant that is not broken down during digestion. Functional fiber is fiber that has been isolated from the plant to provide a beneficial effect in the body. PURPOSE  Increase stool bulk.   Ease and regulate bowel movements.   Lower cholesterol.  INDICATIONS THAT YOU NEED MORE FIBER  Constipation and hemorrhoids.   Uncomplicated diverticulosis (intestine condition) and irritable bowel syndrome.   Weight management.   As a protective measure against hardening of the arteries (atherosclerosis), diabetes, and cancer.  NOTE OF CAUTION If you have a digestive or bowel problem, ask your caregiver for advice before adding high fiber foods to your diet. Some of the following medical problems are such that a high fiber diet should not be used without consulting your caregiver:  Acute diverticulitis (intestine infection).   Partial small bowel obstructions.   Complicated diverticular disease involving bleeding, rupture (perforation), or abscess (boil, furuncle).   Presence of autonomic neuropathy (nerve damage) or gastric paresis (stomach cannot empty itself).  GUIDELINES FOR INCREASING FIBER  Start adding fiber to the diet slowly. A gradual increase of about 5 more grams (2 slices of whole-wheat bread, 2 servings of most fruits or vegetables, or 1 bowl of high fiber cereal) per day is best. Too rapid an increase in fiber may result in constipation, flatulence,  and bloating.   Drink enough water and fluids to keep your urine clear or pale yellow. Water, juice, or caffeine-free drinks are recommended. Not drinking enough fluid may cause constipation.   Eat a variety of high fiber foods rather than one type of fiber.   Try to increase your intake of fiber through using high fiber foods rather than fiber pills or supplements that contain small amounts of fiber.   The goal is to change the types of food eaten. Do not supplement your present diet with high fiber foods, but replace foods in your present diet.  INCLUDE A VARIETY OF FIBER SOURCES  Replace refined and processed grains with whole grains, canned fruits with fresh fruits, and incorporate other fiber sources. White rice, white breads, and most bakery goods contain little or no fiber.   Brown whole-grain rice, buckwheat oats, and many fruits and vegetables are all good sources of fiber. These include: broccoli, Brussels sprouts, cabbage, cauliflower, beets, sweet potatoes, white potatoes (skin on), carrots, tomatoes, eggplant, squash, berries, fresh fruits, and dried fruits.   Cereals appear to be the richest source of fiber. Cereal fiber is found in whole grains and bran. Bran is the fiber-rich outer coat of cereal grain, which is largely removed in refining. In whole-grain cereals, the bran remains. In breakfast cereals, the largest amount of fiber is found in those with "bran" in their names. The fiber content is sometimes indicated on the label.   You may need to include additional fruits and vegetables each day.   In baking, for 1 cup white flour, you may use the following substitutions:   1 cup whole-wheat flour  minus 2 tbs.    cup white flour plus  cup whole-wheat flour.  Document Released: 07/08/2005 Document Revised: 06/27/2011 Document Reviewed: 05/16/2009 [facility] Patient Information 2012 [place], [place].

## 2012-05-03 NOTE — Progress Notes (Signed)
REVIEWED.  

## 2012-05-11 ENCOUNTER — Other Ambulatory Visit: Payer: Self-pay | Admitting: Cardiology

## 2012-05-26 ENCOUNTER — Other Ambulatory Visit: Payer: Self-pay | Admitting: Cardiology

## 2012-06-29 ENCOUNTER — Other Ambulatory Visit: Payer: Self-pay

## 2012-07-08 ENCOUNTER — Other Ambulatory Visit: Payer: Self-pay | Admitting: Cardiology

## 2012-07-08 MED ORDER — PRASUGREL HCL 5 MG PO TABS: 5.0000 mg | ORAL_TABLET | Freq: Every day | ORAL | Status: DC

## 2012-07-20 ENCOUNTER — Encounter: Payer: Self-pay | Admitting: *Deleted

## 2012-09-08 ENCOUNTER — Other Ambulatory Visit: Payer: Self-pay

## 2012-09-08 ENCOUNTER — Other Ambulatory Visit: Payer: Self-pay | Admitting: *Deleted

## 2012-09-08 MED ORDER — PRASUGREL HCL 5 MG PO TABS: 5.0000 mg | ORAL_TABLET | Freq: Every day | ORAL | Status: DC

## 2012-09-08 NOTE — Telephone Encounter (Signed)
Called pt daughter and told her we got a refill request for Katie Ford Effient and that she needs to make an appointment before refill can be made. Daughter stated she would get a hold of her mother and get her to call and schedule an appointment and explain she needs a refill when she calls to schedule appointment.

## 2012-10-08 IMAGING — CR DG CHEST 1V PORT
1 series · 1 of 1 positions shown · non-contrast
Comparison: Portable exam 0203 hours compared to04/20/2011

CLINICAL DATA: Fever, hip fracture, asthma, wheezing

PORTABLE CHEST - 1 VIEW

[view not recorded]
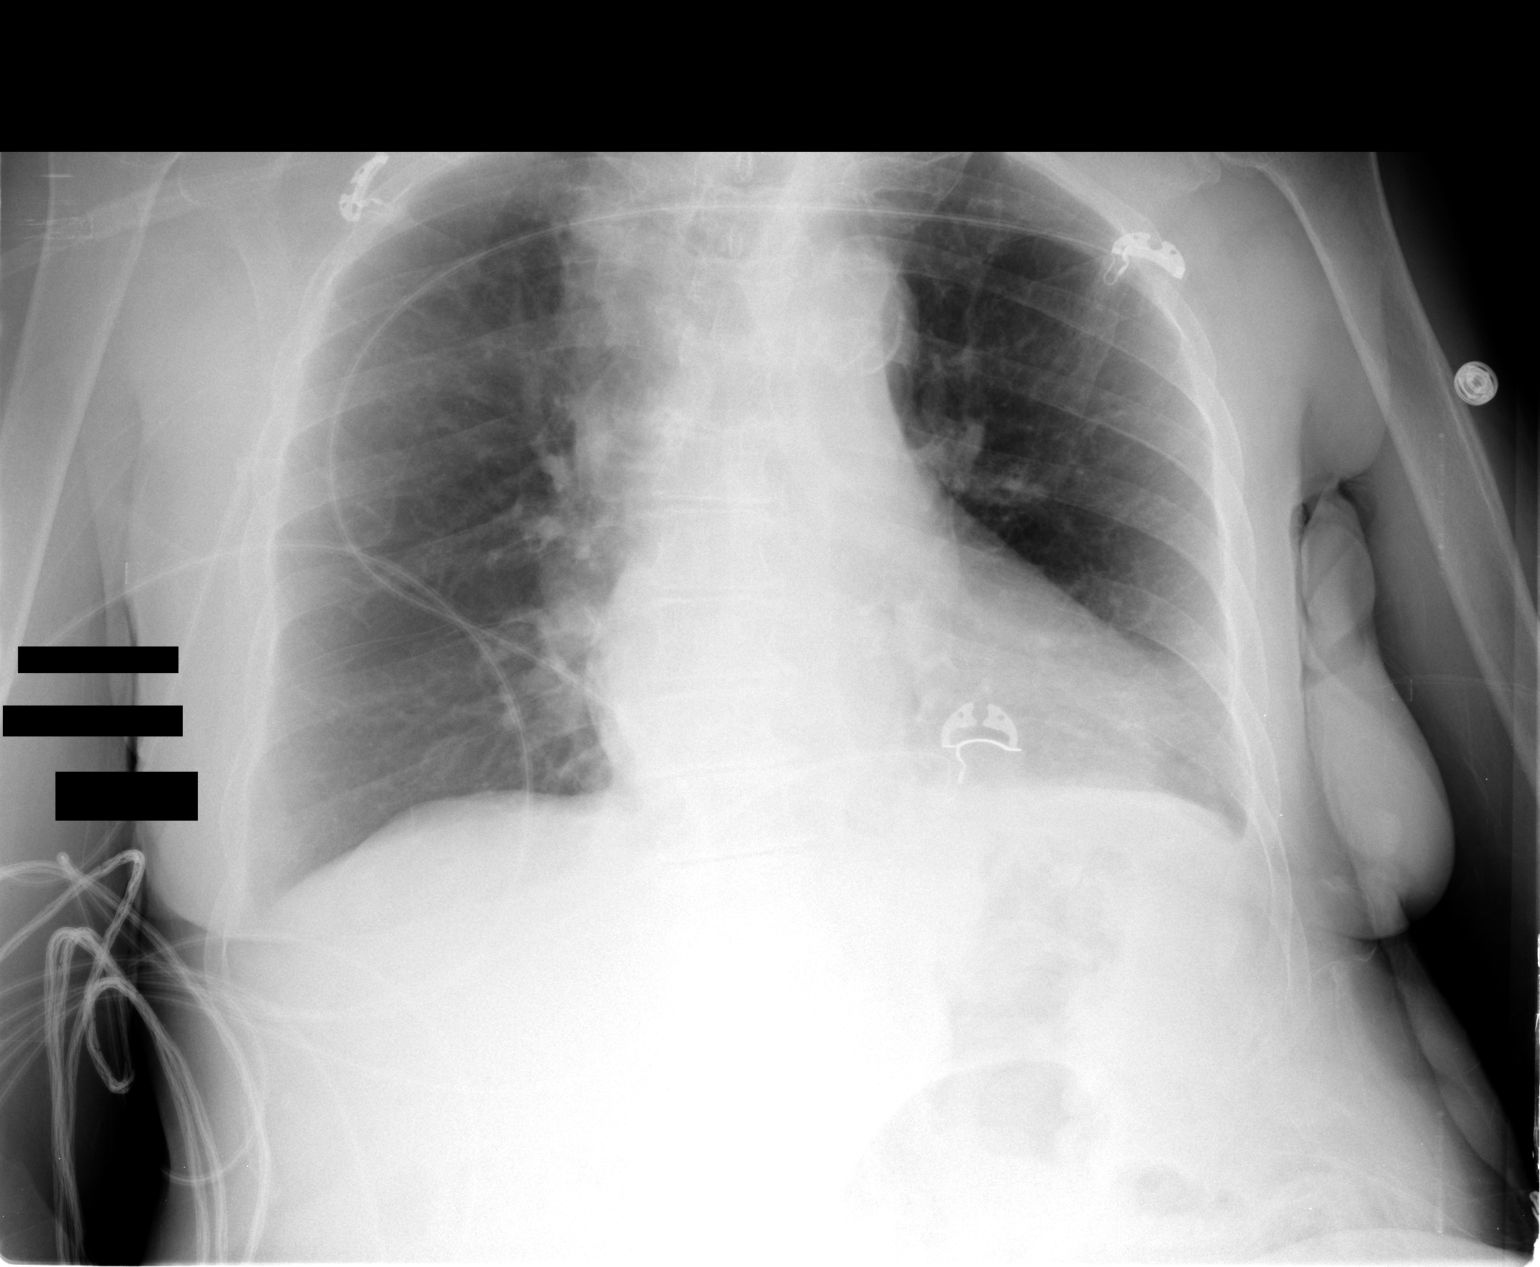

[1 of 1 positions shown; findings below may reference images not displayed]

FINDINGS: Minimal enlargement of cardiac silhouette.
Atherosclerotic calcification aorta.
Pulmonary vascularity normal.
Minimal atelectasis left base.
Question underlying emphysematous changes.
No acute infiltrate, pleural effusion or pneumothorax.
Diffuse osseous demineralization.
IMPRESSION: Enlargement of cardiac silhouette.
Question emphysematous changes with left base atelectasis.

## 2012-10-09 IMAGING — RF DG HIP OPERATIVE*L*
1 series · 3 of 3 positions shown · non-contrast
Comparison: 04/20/2011

CLINICAL DATA: Left hip fracture, ORIF

OPERATIVE LEFT HIP

[Series 1: run · 3 of 3 slices shown]
[im 1/3]
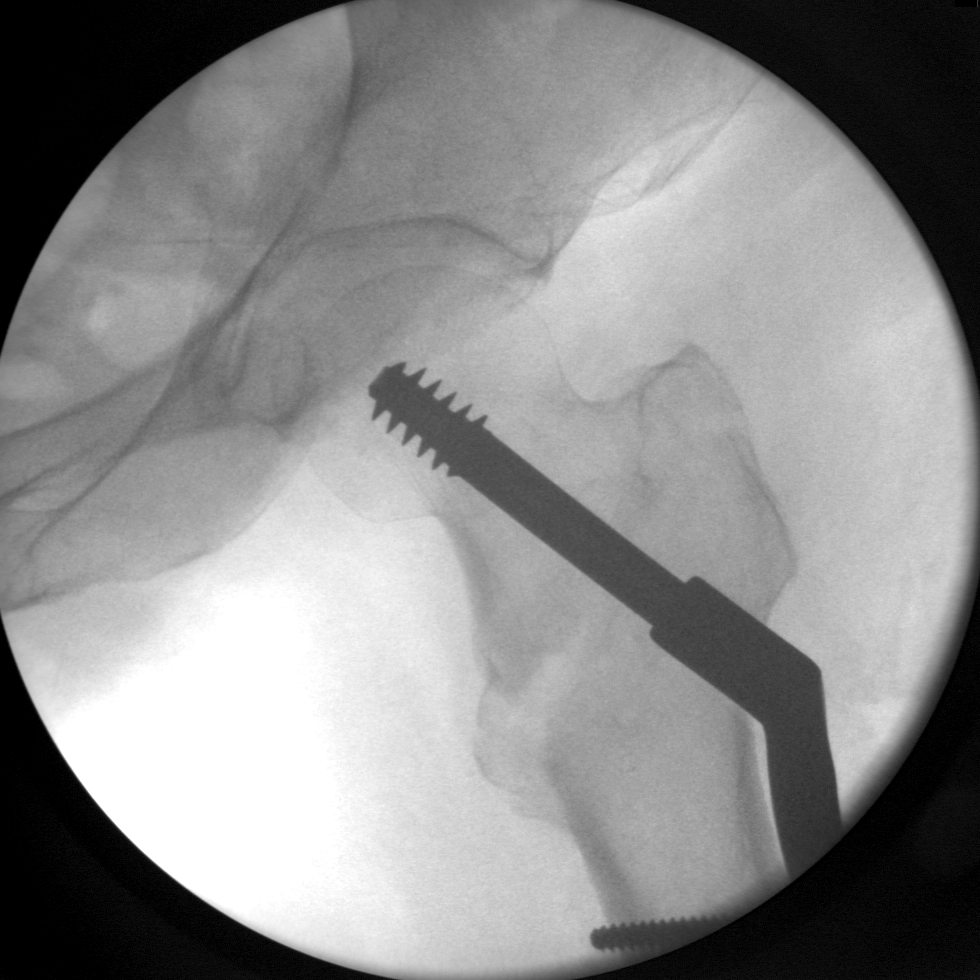
[im 2/3]
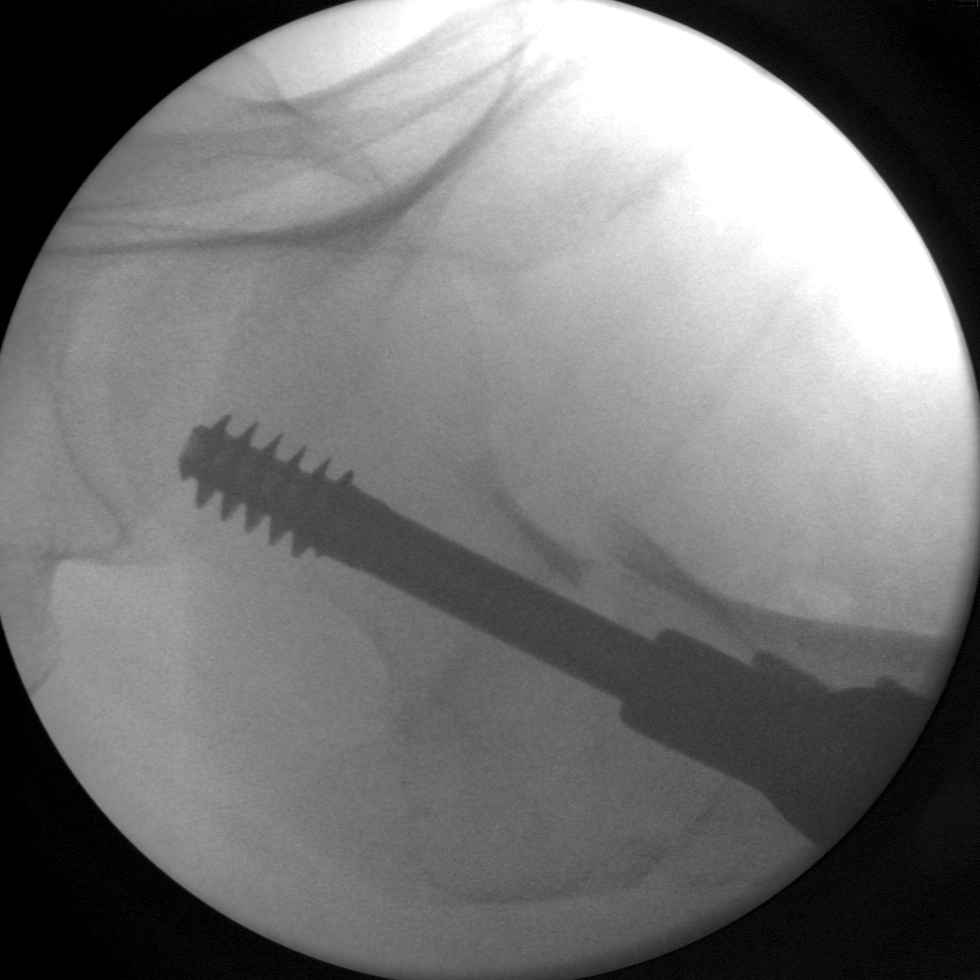
[im 3/3]
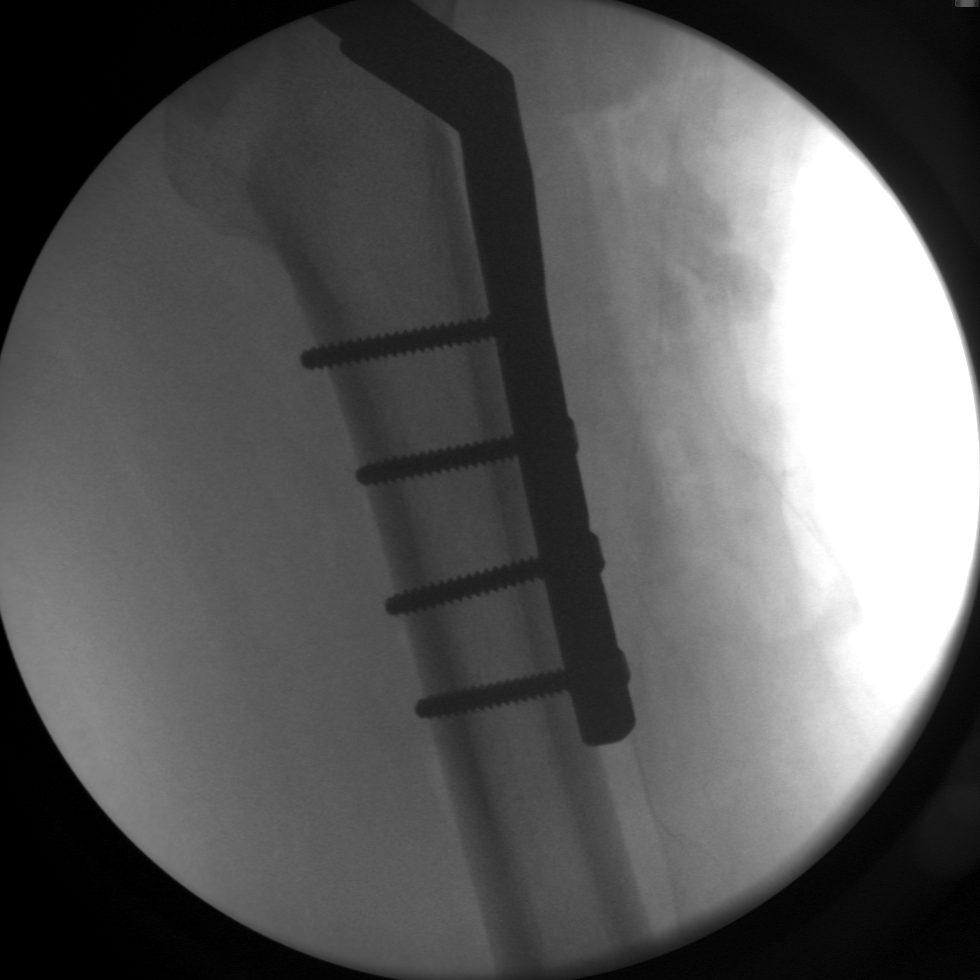

[3 of 3 positions shown; findings below may reference images not displayed]

FINDINGS: Three digital C-arm fluoroscopic images obtained intraoperatively.
Images are interpreted postoperatively.
19 seconds of fluoroscopy utilized by Dr. Ibalio.

Images demonstrate placement of a lateral plate and compression
screw across a reduced intertrochanteric fracture of the proximal
left femur.
No femoral head dislocation or acute complications seen.
IMPRESSION: Post ORIF of intertrochanteric fracture left femur.

## 2012-11-21 ENCOUNTER — Encounter (HOSPITAL_COMMUNITY): Payer: Self-pay | Admitting: *Deleted

## 2012-11-21 ENCOUNTER — Emergency Department (HOSPITAL_COMMUNITY)
Admission: EM | Admit: 2012-11-21 | Discharge: 2012-11-21 | Disposition: A | Discharge status: Home / Self Care | Payer: Medicare Other | Attending: Emergency Medicine | Admitting: Emergency Medicine

## 2012-11-21 DIAGNOSIS — I2510 Atherosclerotic heart disease of native coronary artery without angina pectoris: Secondary | ICD-10-CM | POA: Insufficient documentation

## 2012-11-21 DIAGNOSIS — Z9851 Tubal ligation status: Secondary | ICD-10-CM | POA: Insufficient documentation

## 2012-11-21 DIAGNOSIS — K529 Noninfective gastroenteritis and colitis, unspecified: Secondary | ICD-10-CM

## 2012-11-21 DIAGNOSIS — J45909 Unspecified asthma, uncomplicated: Secondary | ICD-10-CM | POA: Insufficient documentation

## 2012-11-21 DIAGNOSIS — R197 Diarrhea, unspecified: Secondary | ICD-10-CM | POA: Insufficient documentation

## 2012-11-21 DIAGNOSIS — E039 Hypothyroidism, unspecified: Secondary | ICD-10-CM | POA: Insufficient documentation

## 2012-11-21 DIAGNOSIS — K5289 Other specified noninfective gastroenteritis and colitis: Secondary | ICD-10-CM | POA: Insufficient documentation

## 2012-11-21 DIAGNOSIS — Z9861 Coronary angioplasty status: Secondary | ICD-10-CM | POA: Insufficient documentation

## 2012-11-21 DIAGNOSIS — E119 Type 2 diabetes mellitus without complications: Secondary | ICD-10-CM | POA: Insufficient documentation

## 2012-11-21 DIAGNOSIS — Z8781 Personal history of (healed) traumatic fracture: Secondary | ICD-10-CM | POA: Insufficient documentation

## 2012-11-21 DIAGNOSIS — R51 Headache: Secondary | ICD-10-CM | POA: Insufficient documentation

## 2012-11-21 DIAGNOSIS — Z8639 Personal history of other endocrine, nutritional and metabolic disease: Secondary | ICD-10-CM | POA: Insufficient documentation

## 2012-11-21 DIAGNOSIS — Z872 Personal history of diseases of the skin and subcutaneous tissue: Secondary | ICD-10-CM | POA: Insufficient documentation

## 2012-11-21 DIAGNOSIS — Z9089 Acquired absence of other organs: Secondary | ICD-10-CM | POA: Insufficient documentation

## 2012-11-21 DIAGNOSIS — Z862 Personal history of diseases of the blood and blood-forming organs and certain disorders involving the immune mechanism: Secondary | ICD-10-CM | POA: Insufficient documentation

## 2012-11-21 DIAGNOSIS — E669 Obesity, unspecified: Secondary | ICD-10-CM | POA: Insufficient documentation

## 2012-11-21 DIAGNOSIS — K219 Gastro-esophageal reflux disease without esophagitis: Secondary | ICD-10-CM | POA: Insufficient documentation

## 2012-11-21 DIAGNOSIS — R1084 Generalized abdominal pain: Secondary | ICD-10-CM | POA: Insufficient documentation

## 2012-11-21 DIAGNOSIS — Z90710 Acquired absence of both cervix and uterus: Secondary | ICD-10-CM | POA: Insufficient documentation

## 2012-11-21 DIAGNOSIS — Z8679 Personal history of other diseases of the circulatory system: Secondary | ICD-10-CM | POA: Insufficient documentation

## 2012-11-21 DIAGNOSIS — Z8719 Personal history of other diseases of the digestive system: Secondary | ICD-10-CM | POA: Insufficient documentation

## 2012-11-21 DIAGNOSIS — Z79899 Other long term (current) drug therapy: Secondary | ICD-10-CM | POA: Insufficient documentation

## 2012-11-21 DIAGNOSIS — I10 Essential (primary) hypertension: Secondary | ICD-10-CM | POA: Insufficient documentation

## 2012-11-21 MED ORDER — ONDANSETRON 8 MG PO TBDP: ORAL_TABLET | ORAL | Status: DC

## 2012-11-21 MED ORDER — ONDANSETRON HCL 4 MG/2ML IJ SOLN
4.0000 mg | Freq: Once | INTRAMUSCULAR | Status: AC
  Administered 2012-11-21: 4 mg via INTRAVENOUS
  Filled 2012-11-21: qty 2

## 2012-11-21 MED ORDER — FENTANYL CITRATE 0.05 MG/ML IJ SOLN
50.0000 ug | Freq: Once | INTRAMUSCULAR | Status: AC
  Administered 2012-11-21: 50 ug via INTRAVENOUS
  Filled 2012-11-21: qty 2

## 2012-11-21 MED ORDER — PANTOPRAZOLE SODIUM 40 MG IV SOLR
40.0000 mg | Freq: Once | INTRAVENOUS | Status: AC
  Administered 2012-11-21: 40 mg via INTRAVENOUS
  Filled 2012-11-21: qty 40

## 2012-11-21 MED ORDER — SODIUM CHLORIDE 0.9 % IV BOLUS (SEPSIS)
1000.0000 mL | Freq: Once | INTRAVENOUS | Status: AC
  Administered 2012-11-21: 1000 mL via INTRAVENOUS

## 2012-11-21 MED ORDER — ACETAMINOPHEN 325 MG PO TABS
650.0000 mg | ORAL_TABLET | Freq: Once | ORAL | Status: AC
  Administered 2012-11-21: 650 mg via ORAL

## 2012-11-21 MED ORDER — DIPHENOXYLATE-ATROPINE 2.5-0.025 MG PO TABS: 1.0000 | ORAL_TABLET | Freq: Four times a day (QID) | ORAL | Status: DC | PRN

## 2012-11-21 MED ORDER — ACETAMINOPHEN 325 MG PO TABS
ORAL_TABLET | ORAL | Status: AC
  Filled 2012-11-21: qty 2

## 2012-11-21 NOTE — ED Notes (Signed)
Pt alert & oriented x4, stable gait. Patient given discharge instructions, paperwork & prescription(s). Patient  instructed to stop at the registration desk to finish any additional paperwork. Patient verbalized understanding. Pt left department w/ no further questions. 

## 2012-11-21 NOTE — ED Notes (Signed)
Pt c/o n/v/d. Pt has taken some phenergan at home with relief.

## 2012-11-21 NOTE — ED Notes (Signed)
Pt reports N/V/D that started today, pt has taken 1 1/2 tabs of phenergan w/ little relief.

## 2012-11-21 NOTE — ED Provider Notes (Signed)
History  This chart was scribed for Katie Hutching, MD by Bennett Scrape, ED Scribe. This patient was seen in room APA02/[facility]02 and the patient's care was started at 9:24 PM.  CSN: [id number]  Arrival date & time 11/21/12  1901   First MD Initiated Contact with Patient 11/21/12 2124      Chief Complaint  Patient presents with  . Nausea  . Emesis  . Diarrhea     The history is provided by the patient. No language interpreter was used.    HPI HPI Comments: Katie Ford is a 79 y.o. female who presents to the Emergency Department complaining of several episodes of 6 episodes of non-bloody emesis that started yesterday with associated generalized HA, generalized abdominal pain described as achy and several episodes of watery diarrhea that started today. She reports at least 6 episodes of diarrhea today, last episode was PTA. She reports that she took two doses of phenergan with improvement at home. She denies any suspect food intakes or sick contacts. She denies fevers, chills, SOB and CP as associated symptoms. She has a h/o DM, HTN and CAD. Pt denies smoking and alcohol use.   PCP is Dr. Lysbeth Galas  Past Medical History  Diagnosis Date  . Ejection fraction     EF 65 %... normal... catheterization 02/2010.  . Mitral regurgitation     mild, echo, June, 2007  . Paroxysmal supraventricular tachycardia     infrequent episodes over the years..palpitations.  . Hypertension   . Asthmatic bronchitis   . Obesity   . CAD (coronary artery disease)     bare metal stents to 2 separate RCA lesions August, 2011, residual 60% LAD, medical therapy  /  plan was for effient for one month followed by Plavix,, but patient does not tolerate Plavix... therefore patient placed back on effient  . GERD (gastroesophageal reflux disease)   . Edema 03/29/2010    September, 2011  . Intolerance of drug     aspirin and plavix  . Hypothyroidism 04/24/2011  . Hyperglycemia 04/24/2011  . Fracture of rib of left side  04/24/2011  . Hip fracture     October, 2012  . Diverticulitis     remote hx  . Diabetes mellitus     does not have    Past Surgical History  Procedure Laterality Date  . Knee arthroscopy  11/2001    left knee  . Laparoscopic cholecystectomy  04/29/1996    [facility]  . Tubal ligation  09/1970  . [place]le fracture [place]  12/1993    [place]  . Sp arthro thumb*r*  05/1989    [place]  . Total abdominal hysterectomy  1983    [facility]  . Cardiac catheterization  02/22/2010    2 stents, [facility]  . Knee arthroscopy  02/19/2011    Procedure: ARTHROSCOPY KNEE;  Surgeon: Darreld Mclean;  Location: [facility];  Service: Orthopedics;  Laterality: Right;  Medial and Lateral Partial Menisectomy  . Arthroplasty w/ arthroscopy medial / lateral compartment knee      [facility], should read arthroscopy not arthroplasty  . Orif hip fracture  04/25/2011    Procedure: OPEN REDUCTION INTERNAL FIXATION HIP;  Surgeon: Darreld Mclean;  Location: [facility];  Service: Orthopedics;  Laterality: Left;  . Ileocolonscopy  12/06/11    fields:diverticula throughtout the colon/inertnal hemorrhoids/  . Esophagogastroduodenoscopy  12/06/11    fields: Negative H. pylori, hiatal hernia/moderate gastritis    Family History  Problem Relation Age of Onset  . Diabetes Mother   .  Arthritis Mother   . Heart attack Father   . Stroke Father   . Emphysema Father   . Cataracts Father   . Cancer Sister   . Heart disease Sister   . Arthritis Sister   . Arthritis Brother   . Allergies Brother   . Colon cancer Neg Hx     History  Substance Use Topics  . Smoking status: Never Smoker   . Smokeless tobacco: Never Used  . Alcohol Use: No     Comment: rarely    OB History   Grav Para Term Preterm Abortions TAB SAB Ect Mult Living   6 6 6              Review of Systems  A complete 10 system review of systems was obtained and all systems are negative except as noted in the HPI and PMH.   Allergies  Celecoxib; Codeine; Hydrocodone; Tartrazine;  and Aspirin  Home Medications   Current Outpatient Rx  Name  Route  Sig  Dispense  Refill  . albuterol (PROAIR HFA) 108 (90 BASE) MCG/ACT inhaler   Inhalation   Inhale 2 puffs into the lungs every 6 (six) hours as needed. FOR SHORTNESS OF BREATH         . ALPRAZolam (XANAX) 0.5 MG tablet   Oral   Take 0.25-0.5 mg by mouth daily as needed. TAKE HALF TO ONE  A TABLET IF NEEDED FOR ANXIETY          . amLODipine (NORVASC) 10 MG tablet   Oral   Take 10 mg by mouth every morning.          Marland Kitchen CALCIUM-MAGNESUIUM-ZINC 333-133-8.3 MG TABS   Oral   Take 1 tablet by mouth every morning.         . cetirizine (ZYRTEC) 10 MG tablet   Oral   Take 5 mg by mouth 2 (two) times daily.          . diclofenac-misoprostol (ARTHROTEC 75) 75-0.2 MG per tablet   Oral   Take 1 tablet by mouth 2 (two) times daily.           . digoxin (LANOXIN) 0.25 MG tablet   Oral   Take 250 mcg by mouth every morning.          Marland Kitchen esomeprazole (NEXIUM) 40 MG capsule   Oral   Take 40 mg by mouth 2 (two) times daily. 1 po with breakfast and supper         . fluticasone (FLOVENT HFA) 44 MCG/ACT inhaler   Inhalation   Inhale 1 puff into the lungs 2 (two) times daily.           Marland Kitchen levothyroxine (SYNTHROID, LEVOTHROID) 50 MCG tablet   Oral   Take 50 mcg by mouth every morning.          . metoprolol (TOPROL-XL) 50 MG 24 hr tablet   Oral   Take 50-100 mg by mouth 2 (two) times daily. 50mg  every morning and 100mg  every evening.         . nitroGLYCERIN (NITROSTAT) 0.4 MG SL tablet   Sublingual   Place 0.4 mg under the tongue every 5 (five) minutes as needed.           . Polyvinyl Alcohol-Povidone (MURINE TEARS FOR DRY EYES) 5-6 MG/ML SOLN   Both Eyes   Place 1 drop into both eyes every morning.          . prasugrel (EFFIENT) 5 MG  TABS   Oral   Take 5 mg by mouth every morning.         Ermalinda Memos Lecithin 1200 MG CAPS   Oral   Take 1 capsule by mouth every morning.            Triage Vitals: BP 191/62  Pulse 68  Temp(Src) 97.8 F (36.6 C) (Oral)  Resp 18  Ht 5\' 2"  (1.575 m)  Wt 145 lb (65.772 kg)  BMI 26.51 kg/m2  SpO2 100%  Physical Exam  Nursing note and vitals reviewed. Constitutional: She is oriented to person, place, and time. She appears well-developed and well-nourished.  HENT:  Head: Normocephalic and atraumatic.  Eyes: Conjunctivae and EOM are normal. Pupils are equal, round, and reactive to light.  Neck: Normal range of motion. Neck supple.  Cardiovascular: Normal rate, regular rhythm and normal heart sounds.   Pulmonary/Chest: Effort normal and breath sounds normal.  Abdominal: Soft. Bowel sounds are normal.  Musculoskeletal: Normal range of motion.  Neurological: She is alert and oriented to person, place, and time.  Skin: Skin is warm and dry.  Psychiatric: She has a normal mood and affect.    ED Course  Procedures (including critical care time)  DIAGNOSTIC STUDIES: Oxygen Saturation is 100% on room air, normal by my interpretation.    COORDINATION OF CARE:   Labs Reviewed - No data to display No results found.   No diagnosis found.    MDM  Patient feels much better after IV fluids, IV Zofran, IV Protonix       no acute abdomen. Discharge meds Zofran 8 mg #8 with one refill and Lomotil #15 with one refill      I personally performed the services described in this documentation, which was scribed in my presence. The recorded information has been reviewed and is accurate.    Katie Hutching, MD 11/21/12 [phone]

## 2012-11-21 NOTE — ED Notes (Signed)
Pt ambulated to restroom & returned to room w/ no complications. 

## 2012-12-02 ENCOUNTER — Telehealth: Payer: Self-pay

## 2012-12-02 NOTE — Telephone Encounter (Signed)
Received PA request from pharmacy. PA completed and sent to plan but insurance is refusing to pay for it bid. Do you want to do appeal? Or have pt just take it qd? Please advise.

## 2012-12-03 ENCOUNTER — Ambulatory Visit: Payer: Medicare Other | Admitting: Cardiology

## 2012-12-03 MED ORDER — ESOMEPRAZOLE MAGNESIUM 40 MG PO CPDR: 40.0000 mg | DELAYED_RELEASE_CAPSULE | Freq: Every day | ORAL | Status: DC

## 2012-12-03 NOTE — Telephone Encounter (Signed)
Sorry, it is nexium

## 2012-12-03 NOTE — Telephone Encounter (Signed)
I need to know what drug we are talking about. We have not refilled medication or seen patient since 02/2012.

## 2012-12-03 NOTE — Telephone Encounter (Signed)
According to last OV note we suggested she decrease to once daily. Let's give rx for once daily. If patient has problems, she should make OV.

## 2012-12-03 NOTE — Telephone Encounter (Signed)
Pt is aware.  

## 2012-12-16 ENCOUNTER — Telehealth: Payer: Self-pay

## 2012-12-16 NOTE — Telephone Encounter (Signed)
Attempted PA for bid nexium. pts insurance will only pay for it qd not bid. Please advise.

## 2012-12-17 NOTE — Telephone Encounter (Signed)
See 12/02/12 telephone note.

## 2013-01-06 ENCOUNTER — Encounter: Payer: Self-pay | Admitting: Cardiology

## 2013-01-06 ENCOUNTER — Ambulatory Visit (INDEPENDENT_AMBULATORY_CARE_PROVIDER_SITE_OTHER): Payer: Medicare Other | Admitting: Cardiology

## 2013-01-06 VITALS — BP 154/81 | HR 60 | Ht 62.0 in | Wt 148.1 lb

## 2013-01-06 DIAGNOSIS — I471 Supraventricular tachycardia: Secondary | ICD-10-CM

## 2013-01-06 DIAGNOSIS — I2510 Atherosclerotic heart disease of native coronary artery without angina pectoris: Secondary | ICD-10-CM

## 2013-01-06 DIAGNOSIS — I10 Essential (primary) hypertension: Secondary | ICD-10-CM

## 2013-01-06 NOTE — Assessment & Plan Note (Signed)
Systolic pressures 150. This is adequate for her at her age.

## 2013-01-06 NOTE — Assessment & Plan Note (Signed)
By history she's had some paroxysmal supraventricular tachycardia in the past. She's been very well controlled on long term digoxin. I would suggest followup digoxin level if this has not been done. She tells me that she is seeing her primary physician regularly.

## 2013-01-06 NOTE — Progress Notes (Signed)
HPI  Patient is seen back for her yearly followup. I actually saw her last December, 2012. She's doing great. She's not having any recurrent chest pain. She has coronary disease. She cannot tolerate Plavix. She is aspirin allergic. Therefore she remains on long-term prasugrel.  Historically she has had some palpitations. She's been on digoxin for a long time. She tolerates this well.  Allergies  Allergen Reactions  . Celecoxib Other (See Comments)    TACHYCARDIA  . Codeine Other (See Comments)    TACHYCARIDIA  . Hydrocodone Other (See Comments)    TACHYCARDIA  . Tartrazine Other (See Comments)    Causes Tachycardia  . Aspirin     ONLY TARTRAZINE!!! Causes Tachycardia    Current Outpatient Prescriptions  Medication Sig Dispense Refill  . acetaminophen (TYLENOL) 325 MG tablet Take 325 mg by mouth every 6 (six) hours as needed for pain.      Marland Kitchen albuterol (PROAIR HFA) 108 (90 BASE) MCG/ACT inhaler Inhale 2 puffs into the lungs every 6 (six) hours as needed. FOR SHORTNESS OF BREATH      . ALPRAZolam (XANAX) 0.5 MG tablet Take 0.25-0.5 mg by mouth daily as needed. TAKE HALF TO ONE  A TABLET IF NEEDED FOR ANXIETY       . amLODipine (NORVASC) 10 MG tablet Take 10 mg by mouth every morning.       Marland Kitchen CALCIUM-MAGNESUIUM-ZINC 333-133-8.3 MG TABS Take 1 tablet by mouth every morning.      . cetirizine (ZYRTEC) 10 MG tablet Take 5 mg by mouth 2 (two) times daily.       . diclofenac-misoprostol (ARTHROTEC 75) 75-0.2 MG per tablet Take 1 tablet by mouth 2 (two) times daily.        . digoxin (LANOXIN) 0.25 MG tablet Take 250 mcg by mouth every morning.       Marland Kitchen esomeprazole (NEXIUM) 40 MG capsule Take 40 mg by mouth daily.      . fluticasone (FLOVENT HFA) 44 MCG/ACT inhaler Inhale 1 puff into the lungs 2 (two) times daily.        Marland Kitchen levothyroxine (SYNTHROID) 75 MCG tablet Take 75 mcg by mouth daily before breakfast.      . metoprolol (TOPROL-XL) 50 MG 24 hr tablet Take 50-100 mg by mouth 2 (two)  times daily. 50mg  every morning and 100mg  every evening.      . nitroGLYCERIN (NITROSTAT) 0.4 MG SL tablet Place 0.4 mg under the tongue every 5 (five) minutes as needed.        . Polyvinyl Alcohol-Povidone (MURINE TEARS FOR DRY EYES) 5-6 MG/ML SOLN Place 1 drop into both eyes every morning.       . prasugrel (EFFIENT) 5 MG TABS Take 5 mg by mouth every morning.      Ermalinda Memos Lecithin 1200 MG CAPS Take 1 capsule by mouth every morning.       No current facility-administered medications for this visit.    History   Social History  . Marital Status: Widowed    Spouse Name: N/A    Number of Children: N/A  . Years of Education: N/A   Occupational History  . [employer]     Engineer, site   Social History Main Topics  . Smoking status: Never Smoker   . Smokeless tobacco: Never Used  . Alcohol Use: No     Comment: rarely  . Drug Use: No  . Sexually Active: No   Other Topics Concern  . Not on file  Social History Narrative   Retired Engineer, site    Family History  Problem Relation Age of Onset  . Diabetes Mother   . Arthritis Mother   . Heart attack Father   . Stroke Father   . Emphysema Father   . Cataracts Father   . Cancer Sister   . Heart disease Sister   . Arthritis Sister   . Arthritis Brother   . Allergies Brother   . Colon cancer Neg Hx     Past Medical History  Diagnosis Date  . Ejection fraction     EF 65 %... normal... catheterization 02/2010.  . Mitral regurgitation     mild, echo, June, 2007  . Paroxysmal supraventricular tachycardia     infrequent episodes over the years..palpitations.  . Hypertension   . Asthmatic bronchitis   . Obesity   . CAD (coronary artery disease)     bare metal stents to 2 separate RCA lesions August, 2011, residual 60% LAD, medical therapy  /  plan was for effient for one month followed by Plavix,, but patient does not tolerate Plavix... therefore patient placed back on effient  . GERD (gastroesophageal reflux disease)   .  Edema 03/29/2010    September, 2011  . Intolerance of drug     aspirin and plavix  . Hypothyroidism 04/24/2011  . Hyperglycemia 04/24/2011  . Fracture of rib of left side 04/24/2011  . Hip fracture     October, 2012  . Diverticulitis     remote hx  . Diabetes mellitus     does not have    Past Surgical History  Procedure Laterality Date  . Knee arthroscopy  11/2001    left knee  . Laparoscopic cholecystectomy  04/29/1996    [facility]  . Tubal ligation  09/1970  . [place]  [place]    [place]  . [place] thumb[place]r*  05/[place]    [place]  . Total abdominal hysterectomy  1983    [facility]  . Cardiac catheterization  02/22/2010    2 stents, [facility]  . Knee arthroscopy  02/19/2011    Procedure: ARTHROSCOPY KNEE;  Surgeon: Darreld Mclean;  Location: [facility];  Service: Orthopedics;  Laterality: Right;  Medial and Lateral Partial Menisectomy  . Arthroplasty w/ arthroscopy medial / lateral compartment knee      [facility], should read arthroscopy not arthroplasty  . Orif hip fracture  04/25/2011    Procedure: OPEN REDUCTION INTERNAL FIXATION HIP;  Surgeon: Darreld Mclean;  Location: [facility];  Service: Orthopedics;  Laterality: Left;  . Ileocolonscopy  12/06/11    fields:diverticula throughtout the colon/inertnal hemorrhoids/  . Esophagogastroduodenoscopy  12/06/11    fields: Negative H. pylori, hiatal hernia/moderate gastritis    Patient Active Problem List   Diagnosis Date Noted  . Ejection fraction     Priority: High  . Intolerance of drug     Priority: High  . Mitral regurgitation     Priority: High  . Paroxysmal supraventricular tachycardia     Priority: High  . Hypertension     Priority: High  . CAD (coronary artery disease)     Priority: High  . Diverticulosis 03/06/2012  . Abnormal CT of the abdomen 11/25/2011  . Abdominal pain, right lateral 11/25/2011  . Hip fracture   . UTI (urinary tract infection) 04/25/2011  . Leukocytosis 04/24/2011  . Hyponatremia 04/24/2011  . Fracture of  left hip 04/24/2011  . Asthmatic bronchitis 04/24/2011  . Anemia 04/24/2011  . Hypothyroidism 04/24/2011  .  DM2 (diabetes mellitus, type 2) 04/24/2011  . Fracture of rib of left side 04/24/2011  . Asthmatic bronchitis   . Obesity   . GERD (gastroesophageal reflux disease)   . Edema 03/29/2010    ROS   Patient denies fever, chills, headache, sweats, rash, change in vision, change in hearing, chest pain, cough, nausea vomiting, urinary symptoms. All other systems are reviewed and are negative.  PHYSICAL EXAM   The patient really looks good today. There is no jugulovenous distention. She is oriented to person time and place. Affect is normal. Lungs reveal scattered wheezes. Pulmonary disease. Cardiac exam reveals S1 and S2. There no clicks or significant murmurs. The abdomen is soft. There is no peripheral edema. This is from her  Filed Vitals:   01/06/13 1027  BP: 154/81  Pulse: 60  Height: 5\' 2"  (1.575 m)  Weight: 148 lb 1.9 oz (67.187 kg)  SpO2: 97%   EKG is done today and reviewed by me. There is mild sinus bradycardia. There are mild nonspecific ST-T wave changes. There is no significant change.   ASSESSMENT & PLAN

## 2013-01-06 NOTE — Patient Instructions (Addendum)

## 2013-01-06 NOTE — Assessment & Plan Note (Signed)
Coronary disease is stable. She cannot tolerate statins. She remains on long-term Prasugrel as she is aspirin allergic and she cannot tolerate Plavix.

## 2013-01-12 ENCOUNTER — Other Ambulatory Visit: Payer: Self-pay | Admitting: Gastroenterology

## 2013-01-16 ENCOUNTER — Other Ambulatory Visit: Payer: Self-pay | Admitting: Cardiology

## 2013-06-10 ENCOUNTER — Encounter (HOSPITAL_COMMUNITY): Payer: Self-pay | Admitting: Emergency Medicine

## 2013-06-10 ENCOUNTER — Emergency Department (HOSPITAL_COMMUNITY): Payer: Medicare Other

## 2013-06-10 ENCOUNTER — Emergency Department (HOSPITAL_COMMUNITY)
Admission: EM | Admit: 2013-06-10 | Discharge: 2013-06-10 | Disposition: A | Discharge status: Home / Self Care | Payer: Medicare Other | Attending: Emergency Medicine | Admitting: Emergency Medicine

## 2013-06-10 DIAGNOSIS — Z8781 Personal history of (healed) traumatic fracture: Secondary | ICD-10-CM | POA: Insufficient documentation

## 2013-06-10 DIAGNOSIS — R112 Nausea with vomiting, unspecified: Secondary | ICD-10-CM | POA: Insufficient documentation

## 2013-06-10 DIAGNOSIS — I2510 Atherosclerotic heart disease of native coronary artery without angina pectoris: Secondary | ICD-10-CM | POA: Insufficient documentation

## 2013-06-10 DIAGNOSIS — J45901 Unspecified asthma with (acute) exacerbation: Secondary | ICD-10-CM | POA: Insufficient documentation

## 2013-06-10 DIAGNOSIS — Z9861 Coronary angioplasty status: Secondary | ICD-10-CM | POA: Insufficient documentation

## 2013-06-10 DIAGNOSIS — IMO0002 Reserved for concepts with insufficient information to code with codable children: Secondary | ICD-10-CM | POA: Insufficient documentation

## 2013-06-10 DIAGNOSIS — E039 Hypothyroidism, unspecified: Secondary | ICD-10-CM | POA: Insufficient documentation

## 2013-06-10 DIAGNOSIS — I471 Supraventricular tachycardia, unspecified: Secondary | ICD-10-CM | POA: Insufficient documentation

## 2013-06-10 DIAGNOSIS — Z79899 Other long term (current) drug therapy: Secondary | ICD-10-CM | POA: Insufficient documentation

## 2013-06-10 DIAGNOSIS — K219 Gastro-esophageal reflux disease without esophagitis: Secondary | ICD-10-CM | POA: Insufficient documentation

## 2013-06-10 DIAGNOSIS — E669 Obesity, unspecified: Secondary | ICD-10-CM | POA: Insufficient documentation

## 2013-06-10 DIAGNOSIS — I10 Essential (primary) hypertension: Secondary | ICD-10-CM | POA: Insufficient documentation

## 2013-06-10 LAB — URINALYSIS, ROUTINE W REFLEX MICROSCOPIC
Bilirubin Urine: NEGATIVE
Leukocytes, UA: NEGATIVE
Nitrite: NEGATIVE
Specific Gravity, Urine: 1.015 (ref 1.005–1.030)
Urobilinogen, UA: 0.2 mg/dL (ref 0.0–1.0)
pH: 6.5 (ref 5.0–8.0)

## 2013-06-10 LAB — LIPASE, BLOOD: Lipase: 21 U/L (ref 11–59)

## 2013-06-10 LAB — CBC WITH DIFFERENTIAL/PLATELET
Basophils Relative: 1 % (ref 0–1)
Eosinophils Absolute: 1.1 10*3/uL — ABNORMAL HIGH (ref 0.0–0.7)
Eosinophils Relative: 10 % — ABNORMAL HIGH (ref 0–5)
Hemoglobin: 13.8 g/dL (ref 12.0–15.0)
Lymphs Abs: 1.8 10*3/uL (ref 0.7–4.0)
MCH: 28.9 pg (ref 26.0–34.0)
MCHC: 32.2 g/dL (ref 30.0–36.0)
MCV: 89.7 fL (ref 78.0–100.0)
Neutro Abs: 6.9 10*3/uL (ref 1.7–7.7)
Platelets: 274 10*3/uL (ref 150–400)
RBC: 4.78 MIL/uL (ref 3.87–5.11)
RDW: 13.2 % (ref 11.5–15.5)

## 2013-06-10 LAB — COMPREHENSIVE METABOLIC PANEL
ALT: 14 U/L (ref 0–35)
Albumin: 3.6 g/dL (ref 3.5–5.2)
Calcium: 9.3 mg/dL (ref 8.4–10.5)
GFR calc Af Amer: 62 mL/min — ABNORMAL LOW (ref >90)
GFR calc non Af Amer: 53 mL/min — ABNORMAL LOW (ref >90)
Glucose, Bld: 114 mg/dL — ABNORMAL HIGH (ref 70–99)
Potassium: 4.2 mEq/L (ref 3.5–5.1)
Sodium: 139 mEq/L (ref 135–145)
Total Protein: 7.6 g/dL (ref 6.0–8.3)

## 2013-06-10 LAB — TROPONIN I: Troponin I: <0.3 ng/mL (ref <0.30)

## 2013-06-10 MED ORDER — SODIUM CHLORIDE 0.9 % IV BOLUS (SEPSIS)
1000.0000 mL | Freq: Once | INTRAVENOUS | Status: AC
Start: 2013-06-10 — End: 2013-06-10
  Administered 2013-06-10: 1000 mL via INTRAVENOUS

## 2013-06-10 NOTE — ED Provider Notes (Signed)
CSN: [id number]     Arrival date & time 06/10/13  1153 History  This chart was scribed for Gerhard Munch, MD by Quintella Reichert, ED scribe.  This patient was seen in room [facility] and the patient's care was started at 12:49 PM.   Chief Complaint  Patient presents with  . Hypertension    The history is provided by the patient. No language interpreter was used.    HPI Comments: Katie Ford is a 79 y.o. female with h/o HTN, asthma, and CAD who presents to the Emergency Department complaining of HTN.  Pt was in her usual state of health until 3:30 AM this morning when she woke up with nausea, chills and hot sweats.  She denies vomiting or diarrhea.  She was taken to Urgent Care and sent to the ED because her BP was found to be around 200/100.  Presently she states her nausea is somewhat improved and she feels "relaxed."  She denies CP, fever, rash, weight gain, or SOB.  Family denies behavioral changes.  Pt denies prior h/o similar episodes and states she is generally in good health.  She uses Advair and ProAir for her asthma and states this is effective.     Past Medical History  Diagnosis Date  . Ejection fraction     EF 65 %... normal... catheterization 02/2010.  . Mitral regurgitation     mild, echo, June, 2007  . Paroxysmal supraventricular tachycardia     infrequent episodes over the years..palpitations.  . Hypertension   . Asthmatic bronchitis   . Obesity   . CAD (coronary artery disease)     bare metal stents to 2 separate RCA lesions August, 2011, residual 60% LAD, medical therapy  /  plan was for effient for one month followed by Plavix,, but patient does not tolerate Plavix... therefore patient placed back on effient  . GERD (gastroesophageal reflux disease)   . Edema 03/29/2010    September, 2011  . Intolerance of drug     aspirin and plavix  . Hypothyroidism 04/24/2011  . Hyperglycemia 04/24/2011  . Fracture of rib of left side 04/24/2011  . Hip fracture      October, 2012  . Diverticulitis     remote hx  . Diabetes mellitus     does not have    Past Surgical History  Procedure Laterality Date  . Knee arthroscopy  11/2001    left knee  . Laparoscopic cholecystectomy  04/29/1996    [facility]  . Tubal ligation  09/1970  . Ankle fracture surgery  12/1993    [place]  . Sp arthro thumb*r*  05/1989    [place]  . Total abdominal hysterectomy  1983    [facility]  . Cardiac catheterization  02/22/2010    2 stents, [facility]  . Knee arthroscopy  02/19/2011    Procedure: ARTHROSCOPY KNEE;  Surgeon: Darreld Mclean;  Location: [facility];  Service: Orthopedics;  Laterality: Right;  Medial and Lateral Partial Menisectomy  . Arthroplasty w/ arthroscopy medial / lateral compartment knee      [facility], should read arthroscopy not arthroplasty  . Orif hip fracture  04/25/2011    Procedure: OPEN REDUCTION INTERNAL FIXATION HIP;  Surgeon: Darreld Mclean;  Location: [facility];  Service: Orthopedics;  Laterality: Left;  . Ileocolonscopy  12/06/11    fields:diverticula throughtout the colon/inertnal hemorrhoids/  . Esophagogastroduodenoscopy  12/06/11    fields: Negative H. pylori, hiatal hernia/moderate gastritis    Family History  Problem Relation Age of  Onset  . Diabetes Mother   . Arthritis Mother   . Heart attack Father   . Stroke Father   . Emphysema Father   . Cataracts Father   . Cancer Sister   . Heart disease Sister   . Arthritis Sister   . Arthritis Brother   . Allergies Brother   . Colon cancer Neg Hx     History  Substance Use Topics  . Smoking status: Never Smoker   . Smokeless tobacco: Never Used  . Alcohol Use: No     Comment: rarely    OB History   Grav Para Term Preterm Abortions TAB SAB Ect Mult Living   6 6 6              Review of Systems  Constitutional:       Per HPI, otherwise negative  HENT:       Per HPI, otherwise negative  Respiratory:       Per HPI, otherwise negative  Cardiovascular:       Per HPI, otherwise negative   Gastrointestinal: Negative for vomiting.  Endocrine:       Negative aside from HPI  Genitourinary:       Neg aside from HPI   Musculoskeletal:       Per HPI, otherwise negative  Skin: Negative.   Neurological: Negative for syncope.     Allergies  Celecoxib; Codeine; Hydrocodone; Tartrazine; and Aspirin  Home Medications   Current Outpatient Rx  Name  Route  Sig  Dispense  Refill  . acetaminophen (TYLENOL) 325 MG tablet   Oral   Take 325 mg by mouth every 6 (six) hours as needed for pain.         Marland Kitchen albuterol (PROAIR HFA) 108 (90 BASE) MCG/ACT inhaler   Inhalation   Inhale 2 puffs into the lungs every 6 (six) hours as needed. FOR SHORTNESS OF BREATH         . ALPRAZolam (XANAX) 0.5 MG tablet   Oral   Take 0.25-0.5 mg by mouth daily as needed. TAKE HALF TO ONE  A TABLET IF NEEDED FOR ANXIETY          . amLODipine (NORVASC) 10 MG tablet   Oral   Take 10 mg by mouth every morning.          Marland Kitchen CALCIUM-MAGNESUIUM-ZINC 333-133-8.3 MG TABS   Oral   Take 1 tablet by mouth every morning.         . cetirizine (ZYRTEC) 10 MG tablet   Oral   Take 5 mg by mouth 2 (two) times daily.          . diclofenac-misoprostol (ARTHROTEC 75) 75-0.2 MG per tablet   Oral   Take 1 tablet by mouth 2 (two) times daily.           . digoxin (LANOXIN) 0.25 MG tablet   Oral   Take 250 mcg by mouth every morning.          Marland Kitchen EFFIENT 5 MG TABS      TAKE ONE TABLET BY MOUTH ONE TIME DAILY   30 each   11   . esomeprazole (NEXIUM) 40 MG capsule   Oral   Take 1 capsule (40 mg total) by mouth daily before breakfast.   30 capsule   11   . fluticasone (FLOVENT HFA) 44 MCG/ACT inhaler   Inhalation   Inhale 1 puff into the lungs 2 (two) times daily.           Marland Kitchen  levothyroxine (SYNTHROID) 75 MCG tablet   Oral   Take 75 mcg by mouth daily before breakfast.         . metoprolol (TOPROL-XL) 50 MG 24 hr tablet   Oral   Take 50-100 mg by mouth 2 (two) times daily. 50mg  every  morning and 100mg  every evening.         . nitroGLYCERIN (NITROSTAT) 0.4 MG SL tablet   Sublingual   Place 0.4 mg under the tongue every 5 (five) minutes as needed.           . Polyvinyl Alcohol-Povidone (MURINE TEARS FOR DRY EYES) 5-6 MG/ML SOLN   Both Eyes   Place 1 drop into both eyes every morning.          Ermalinda Memos Lecithin 1200 MG CAPS   Oral   Take 1 capsule by mouth every morning.          BP 190/95  Pulse 67  Temp(Src) 98.2 F (36.8 C) (Oral)  Resp 16  SpO2 98%  Physical Exam  Nursing note and vitals reviewed. Constitutional: She is oriented to person, place, and time. She appears well-developed and well-nourished. No distress.  HENT:  Head: Normocephalic and atraumatic.  Eyes: Conjunctivae and EOM are normal.  Cardiovascular: Normal rate and regular rhythm.   Pulmonary/Chest: Effort normal. No stridor. No respiratory distress. She has wheezes (trace, bilateral).  Abdominal: She exhibits no distension.  Musculoskeletal: She exhibits no edema.  Neurological: She is alert and oriented to person, place, and time. No cranial nerve deficit.  Skin: Skin is warm and dry.  Psychiatric: She has a normal mood and affect.    ED Course  Procedures (including critical care time)  DIAGNOSTIC STUDIES: Oxygen Saturation is 98% on room air, normal by my interpretation.    COORDINATION OF CARE: 12:56 PM-Discussed treatment plan with pt at bedside and pt agreed to plan.    Labs Review Labs Reviewed - No data to display  Imaging Review No results found.  EKG Interpretation    Date/Time:  Thursday June 10 2013 13:51:37 EST Ventricular Rate:  60 PR Interval:  186 QRS Duration: 102 QT Interval:  412 QTC Calculation: 412 R Axis:   44 Text Interpretation:  Normal sinus rhythm Cannot rule out Inferior infarct , age undetermined QRS amplitude has increased since last tracing. Abnormal ECG Confirmed by Gerhard Munch  MD [id number]) on 06/10/2013 3:06:59  PM           3:07 PM Patient in no distress.  She requests discharge.  I discussed all results with her and her 2 sisters.  She'll follow up with her physician. MDM  No diagnosis found.  I personally performed the services described in this documentation, which was scribed in my presence. The recorded information has been reviewed and is accurate.   This patient presents after episode of nausea, and his son be hypertensive initially.  Symptoms improve, vital signs improved, the patient has no decompensation throughout her emergency department course the patient has had mild leukocytosis, but this is consistent for her. Absent any evidence of distress, with her improvement, and with a request for discharge, she was discharged to follow up with her primary care physician.   Gerhard Munch, MD 06/10/13 [phone]

## 2013-06-10 NOTE — ED Notes (Signed)
Pt states "when I stand up I feel swimmy-headed".

## 2013-06-10 NOTE — ED Notes (Signed)
Discharge instructions reviewed with pt, questions answered. Pt verbalized understanding.  

## 2013-06-10 NOTE — ED Notes (Signed)
Pt comes from [facility] via EMS with c/o hypertension and dizziness since early this morning. Pt denies headache and chest pain. Pt's BP was 200/70 in route. 20g IV in right AC.

## 2013-07-11 ENCOUNTER — Encounter (HOSPITAL_COMMUNITY): Payer: Self-pay | Admitting: Emergency Medicine

## 2013-07-11 ENCOUNTER — Emergency Department (HOSPITAL_COMMUNITY)
Admission: EM | Admit: 2013-07-11 | Discharge: 2013-07-11 | Disposition: A | Discharge status: Home / Self Care | Payer: Medicare Other | Attending: Emergency Medicine | Admitting: Emergency Medicine

## 2013-07-11 ENCOUNTER — Emergency Department (HOSPITAL_COMMUNITY): Payer: Medicare Other

## 2013-07-11 DIAGNOSIS — E669 Obesity, unspecified: Secondary | ICD-10-CM | POA: Insufficient documentation

## 2013-07-11 DIAGNOSIS — K219 Gastro-esophageal reflux disease without esophagitis: Secondary | ICD-10-CM | POA: Insufficient documentation

## 2013-07-11 DIAGNOSIS — I2510 Atherosclerotic heart disease of native coronary artery without angina pectoris: Secondary | ICD-10-CM | POA: Insufficient documentation

## 2013-07-11 DIAGNOSIS — Z9089 Acquired absence of other organs: Secondary | ICD-10-CM | POA: Insufficient documentation

## 2013-07-11 DIAGNOSIS — Z9861 Coronary angioplasty status: Secondary | ICD-10-CM | POA: Insufficient documentation

## 2013-07-11 DIAGNOSIS — E039 Hypothyroidism, unspecified: Secondary | ICD-10-CM | POA: Insufficient documentation

## 2013-07-11 DIAGNOSIS — I10 Essential (primary) hypertension: Secondary | ICD-10-CM | POA: Insufficient documentation

## 2013-07-11 DIAGNOSIS — J45909 Unspecified asthma, uncomplicated: Secondary | ICD-10-CM | POA: Insufficient documentation

## 2013-07-11 DIAGNOSIS — G459 Transient cerebral ischemic attack, unspecified: Secondary | ICD-10-CM | POA: Insufficient documentation

## 2013-07-11 DIAGNOSIS — IMO0002 Reserved for concepts with insufficient information to code with codable children: Secondary | ICD-10-CM | POA: Insufficient documentation

## 2013-07-11 DIAGNOSIS — Z8781 Personal history of (healed) traumatic fracture: Secondary | ICD-10-CM | POA: Insufficient documentation

## 2013-07-11 DIAGNOSIS — Z9851 Tubal ligation status: Secondary | ICD-10-CM | POA: Insufficient documentation

## 2013-07-11 DIAGNOSIS — Z79899 Other long term (current) drug therapy: Secondary | ICD-10-CM | POA: Insufficient documentation

## 2013-07-11 DIAGNOSIS — Z791 Long term (current) use of non-steroidal anti-inflammatories (NSAID): Secondary | ICD-10-CM | POA: Insufficient documentation

## 2013-07-11 LAB — BASIC METABOLIC PANEL
Calcium: 9.2 mg/dL (ref 8.4–10.5)
GFR calc Af Amer: 64 mL/min — ABNORMAL LOW (ref >90)
GFR calc non Af Amer: 55 mL/min — ABNORMAL LOW (ref >90)
Sodium: 137 mEq/L (ref 135–145)

## 2013-07-11 LAB — CBC WITH DIFFERENTIAL/PLATELET
Eosinophils Absolute: 1 10*3/uL — ABNORMAL HIGH (ref 0.0–0.7)
Lymphocytes Relative: 7 % — ABNORMAL LOW (ref 12–46)
Lymphs Abs: 0.6 10*3/uL — ABNORMAL LOW (ref 0.7–4.0)
Neutro Abs: 6.2 10*3/uL (ref 1.7–7.7)
Neutrophils Relative %: 69 % (ref 43–77)
Platelets: 251 10*3/uL (ref 150–400)
RBC: 4.4 MIL/uL (ref 3.87–5.11)
WBC: 9 10*3/uL (ref 4.0–10.5)

## 2013-07-11 MED ORDER — TRAMADOL HCL 50 MG PO TABS
50.0000 mg | ORAL_TABLET | Freq: Once | ORAL | Status: AC
  Administered 2013-07-11: 50 mg via ORAL
  Filled 2013-07-11: qty 1

## 2013-07-11 MED ORDER — TRAMADOL HCL 50 MG PO TABS: 50.0000 mg | ORAL_TABLET | Freq: Four times a day (QID) | ORAL | Status: DC | PRN

## 2013-07-11 NOTE — ED Notes (Signed)
Pt up to the restroom.  nad noted

## 2013-07-11 NOTE — ED Notes (Signed)
Pt states she had 2 episodes of feeling disoriented and weak/dizzy. Pt states she still feels "off a little". Pt states they increased her synthroid from to x 1 week ago.

## 2013-07-11 NOTE — ED Provider Notes (Signed)
CSN: [id number]     Arrival date & time 07/11/13  1257 History  This chart was scribed for Donnetta Hutching, MD by Leone Payor, ED Scribe. This patient was seen in room APA17/APA17 and the patient's care was started 1:19 PM.     Chief Complaint  Patient presents with  . Altered Mental Status   The history is provided by the patient and a relative. No language interpreter was used.    HPI Comments: EDANA AGUADO is a 79 y.o. female who presents to the Emergency Department complaining of intermittent episodes of altered mental status that began yesterday. Pt states yesterday's episode lasted about 1 hour. She also had an episode that also lasted 1 hour early this morning about 9 hours ago. She reports feeling disoriented and was unsure where she was during the episodes. She reports having an increase in her Synthroid last week. She reports feeling back to baseline at this time. She has a history of cardiac catheterizations and stents.   PCP [place].   Past Medical History  Diagnosis Date  . Ejection fraction     EF 65 %... normal... catheterization 02/2010.  . Mitral regurgitation     mild, echo, June, 2007  . Paroxysmal supraventricular tachycardia     infrequent episodes over the years..palpitations.  . Hypertension   . Asthmatic bronchitis   . Obesity   . CAD (coronary artery disease)     bare metal stents to 2 separate RCA lesions August, 2011, residual 60% LAD, medical therapy  /  plan was for effient for one month followed by Plavix,, but patient does not tolerate Plavix... therefore patient placed back on effient  . GERD (gastroesophageal reflux disease)   . Edema 03/29/2010    September, 2011  . Intolerance of drug     aspirin and plavix  . Hypothyroidism 04/24/2011  . Hyperglycemia 04/24/2011  . Fracture of rib of left side 04/24/2011  . Hip fracture     October, 2012  . Diverticulitis     remote hx  . Diabetes mellitus     does not have   Past Surgical History  Procedure  Laterality Date  . Knee arthroscopy  11/2001    left knee  . Laparoscopic cholecystectomy  04/29/1996    [facility] hospital  . Tubal ligation  09/1970  . [place]  . [place] thumb[place]  [place]  . [facility] abdominal hysterectomy  1983    [facility]  . Cardiac catheterization  02/22/2010    2 stents, [facility]  . Knee arthroscopy  02/19/2011    Procedure: ARTHROSCOPY KNEE;  Surgeon: Darreld Mclean;  Location: [facility];  Service: Orthopedics;  Laterality: Right;  Medial and Lateral Partial Menisectomy  . Arthroplasty w/ arthroscopy medial / lateral compartment knee      [facility], should read arthroscopy not arthroplasty  . Orif hip fracture  04/25/2011    Procedure: OPEN REDUCTION INTERNAL FIXATION HIP;  Surgeon: Darreld Mclean;  Location: [facility];  Service: Orthopedics;  Laterality: Left;  . Ileocolonscopy  12/06/11    fields:diverticula throughtout the colon/inertnal hemorrhoids/  . Esophagogastroduodenoscopy  12/06/11    fields: Negative H. pylori, hiatal hernia/moderate gastritis   Family History  Problem Relation Age of Onset  . Diabetes Mother   . Arthritis Mother   . Heart attack Father   . Stroke Father   . Emphysema Father   . Cataracts Father   . Cancer Sister   .  Heart disease Sister   . Arthritis Sister   . Arthritis Brother   . Allergies Brother   . Colon cancer Neg Hx    History  Substance Use Topics  . Smoking status: Never Smoker   . Smokeless tobacco: Never Used  . Alcohol Use: No     Comment: rarely   OB History   Grav Para Term Preterm Abortions TAB SAB Ect Mult Living   6 6 6             Review of Systems A complete 10 system review of systems was obtained and all systems are negative except as noted in the HPI and PMH.   Allergies  Celecoxib; Codeine; Hydrocodone; Tartrazine; and Aspirin  Home Medications   Current Outpatient Rx  Name  Route  Sig  Dispense  Refill  . acetaminophen (TYLENOL) 325 MG tablet   Oral   Take 325 mg by mouth  every 6 (six) hours as needed for pain.         Marland Kitchen ADVAIR DISKUS 250-50 MCG/DOSE AEPB   Inhalation   Inhale 1 puff into the lungs every morning.         Marland Kitchen albuterol (PROAIR HFA) 108 (90 BASE) MCG/ACT inhaler   Inhalation   Inhale 2 puffs into the lungs every 6 (six) hours as needed. FOR SHORTNESS OF BREATH         . ALPRAZolam (XANAX) 0.5 MG tablet   Oral   Take 0.25-0.5 mg by mouth daily as needed. TAKE HALF TO ONE  A TABLET IF NEEDED FOR ANXIETY          . amLODipine (NORVASC) 10 MG tablet   Oral   Take 10 mg by mouth every morning.          Marland Kitchen CALCIUM-MAGNESUIUM-ZINC 333-133-8.3 MG TABS   Oral   Take 1 tablet by mouth every morning.         . cetirizine (ZYRTEC) 10 MG tablet   Oral   Take 5 mg by mouth 2 (two) times daily.          . diclofenac-misoprostol (ARTHROTEC 75) 75-0.2 MG per tablet   Oral   Take 1 tablet by mouth 2 (two) times daily.           . digoxin (LANOXIN) 0.25 MG tablet   Oral   Take 250 mcg by mouth every morning.          Marland Kitchen EFFIENT 5 MG TABS      TAKE ONE TABLET BY MOUTH ONE TIME DAILY   30 each   11   . esomeprazole (NEXIUM) 40 MG capsule   Oral   Take 1 capsule (40 mg total) by mouth daily before breakfast.   30 capsule   11   . fluticasone (FLOVENT HFA) 44 MCG/ACT inhaler   Inhalation   Inhale 1 puff into the lungs 2 (two) times daily.           Marland Kitchen levothyroxine (SYNTHROID) 75 MCG tablet   Oral   Take 50 mcg by mouth daily before breakfast.          . metoprolol (TOPROL-XL) 50 MG 24 hr tablet   Oral   Take 50-100 mg by mouth 2 (two) times daily. 50mg  every morning and 100mg  every evening.         . nitroGLYCERIN (NITROSTAT) 0.4 MG SL tablet   Sublingual   Place 0.4 mg under the tongue every 5 (five) minutes as needed.           Marland Kitchen  Polyvinyl Alcohol-Povidone (MURINE TEARS FOR DRY EYES) 5-6 MG/ML SOLN   Both Eyes   Place 1 drop into both eyes every morning.          Ermalinda Memos Lecithin 1200 MG CAPS    Oral   Take 1 capsule by mouth every morning.         . traMADol (ULTRAM) 50 MG tablet   Oral   Take 50 mg by mouth every 4 (four) hours as needed.          BP 157/74  Pulse 68  Temp(Src) 98.3 F (36.8 C) (Oral)  Resp 17  SpO2 97% Physical Exam  Nursing note and vitals reviewed. Constitutional: She is oriented to person, place, and time. She appears well-developed and well-nourished.  HENT:  Head: Normocephalic and atraumatic.  Eyes: Conjunctivae and EOM are normal. Pupils are equal, round, and reactive to light.  Neck: Normal range of motion. Neck supple.  Cardiovascular: Normal rate, regular rhythm and normal heart sounds.   Pulmonary/Chest: Effort normal and breath sounds normal.  Abdominal: Soft. Bowel sounds are normal.  Musculoskeletal: Normal range of motion.  Neurological: She is alert and oriented to person, place, and time.  Skin: Skin is warm and dry.  Psychiatric: She has a normal mood and affect. Her behavior is normal.    ED Course  Procedures   DIAGNOSTIC STUDIES: Oxygen Saturation is 97% on RA, adequate by my interpretation.    COORDINATION OF CARE: 1:25 PM Will order head CT, BMP, CBC. Discussed treatment plan with pt at bedside and pt agreed to plan.  Labs Review Labs Reviewed  BASIC METABOLIC PANEL - Abnormal; Notable for the following:    Glucose, Bld 106 (*)    GFR calc non Af Amer 55 (*)    GFR calc Af Amer 64 (*)    All other components within normal limits  CBC WITH DIFFERENTIAL - Abnormal; Notable for the following:    Lymphocytes Relative 7 (*)    Lymphs Abs 0.6 (*)    Monocytes Relative 13 (*)    Monocytes Absolute 1.2 (*)    Eosinophils Relative 11 (*)    Eosinophils Absolute 1.0 (*)    All other components within normal limits   Imaging Review No results found.  EKG Interpretation    Date/Time:  Sunday July 11 2013 13:51:47 EST Ventricular Rate:  65 PR Interval:  166 QRS Duration: 108 QT Interval:  386 QTC  Calculation: 401 R Axis:   5 Text Interpretation:  Normal sinus rhythm Incomplete right bundle branch block Nonspecific ST and T wave abnormality Abnormal ECG When compared with ECG of 10-Jun-2013 13:51, No significant change was found Confirmed by Finlay Godbee  MD, Betsie Peckman ([id number]) on 07/11/2013 4:02:12 PM           Date: 07/11/2013  Rate: 65  Rhythm: normal sinus rhythm  QRS Axis: normal  Intervals: normal  ST/T Wave abnormalities: nonspecific  Conduction Disutrbances: inc RBBB  Narrative Interpretation: unremarkable      MDM  No diagnosis found. History and physical consistent with TIA.  Patient and family want to go home. She now has a normal exam. She will get primary care followup for her outpatient workup. Discharge medications Ultram  I personally performed the services described in this documentation, which was scribed in my presence. The recorded information has been reviewed and is accurate.   Donnetta Hutching, MD 07/11/13 1700

## 2013-07-13 ENCOUNTER — Ambulatory Visit (HOSPITAL_COMMUNITY)
Admission: RE | Admit: 2013-07-13 | Discharge: 2013-07-13 | Disposition: A | Discharge status: Home / Self Care | Payer: Medicare Other | Source: Ambulatory Visit | Attending: Family Medicine | Admitting: Family Medicine

## 2013-07-13 ENCOUNTER — Other Ambulatory Visit (HOSPITAL_COMMUNITY): Payer: Self-pay | Admitting: Adult Health Nurse Practitioner

## 2013-07-13 ENCOUNTER — Ambulatory Visit (HOSPITAL_COMMUNITY)
Admission: RE | Admit: 2013-07-13 | Discharge: 2013-07-13 | Disposition: A | Discharge status: Home / Self Care | Payer: Medicare Other | Source: Ambulatory Visit | Attending: Adult Health Nurse Practitioner | Admitting: Adult Health Nurse Practitioner

## 2013-07-13 ENCOUNTER — Other Ambulatory Visit (HOSPITAL_COMMUNITY): Payer: Self-pay | Admitting: Family Medicine

## 2013-07-13 DIAGNOSIS — M25559 Pain in unspecified hip: Secondary | ICD-10-CM

## 2013-07-13 DIAGNOSIS — R52 Pain, unspecified: Secondary | ICD-10-CM

## 2013-07-13 DIAGNOSIS — M5030 Other cervical disc degeneration, unspecified cervical region: Secondary | ICD-10-CM | POA: Insufficient documentation

## 2013-07-13 DIAGNOSIS — IMO0002 Reserved for concepts with insufficient information to code with codable children: Secondary | ICD-10-CM

## 2013-07-13 DIAGNOSIS — R200 Anesthesia of skin: Secondary | ICD-10-CM

## 2013-07-13 DIAGNOSIS — I2510 Atherosclerotic heart disease of native coronary artery without angina pectoris: Secondary | ICD-10-CM | POA: Insufficient documentation

## 2013-07-13 DIAGNOSIS — M25552 Pain in left hip: Secondary | ICD-10-CM

## 2013-07-13 DIAGNOSIS — R05 Cough: Secondary | ICD-10-CM | POA: Insufficient documentation

## 2013-07-13 DIAGNOSIS — R059 Cough, unspecified: Secondary | ICD-10-CM

## 2013-07-13 DIAGNOSIS — Z9889 Other specified postprocedural states: Secondary | ICD-10-CM | POA: Insufficient documentation

## 2013-07-13 DIAGNOSIS — I10 Essential (primary) hypertension: Secondary | ICD-10-CM | POA: Insufficient documentation

## 2013-07-13 DIAGNOSIS — R509 Fever, unspecified: Secondary | ICD-10-CM

## 2013-07-13 DIAGNOSIS — M79604 Pain in right leg: Secondary | ICD-10-CM

## 2013-07-13 DIAGNOSIS — I252 Old myocardial infarction: Secondary | ICD-10-CM | POA: Insufficient documentation

## 2013-07-13 DIAGNOSIS — R5383 Other fatigue: Secondary | ICD-10-CM

## 2013-07-13 DIAGNOSIS — M25551 Pain in right hip: Secondary | ICD-10-CM

## 2013-07-13 DIAGNOSIS — G459 Transient cerebral ischemic attack, unspecified: Secondary | ICD-10-CM

## 2013-07-13 DIAGNOSIS — I6529 Occlusion and stenosis of unspecified carotid artery: Secondary | ICD-10-CM | POA: Insufficient documentation

## 2013-07-13 DIAGNOSIS — E119 Type 2 diabetes mellitus without complications: Secondary | ICD-10-CM | POA: Insufficient documentation

## 2013-07-13 DIAGNOSIS — M79609 Pain in unspecified limb: Secondary | ICD-10-CM | POA: Insufficient documentation

## 2013-07-13 DIAGNOSIS — M25519 Pain in unspecified shoulder: Secondary | ICD-10-CM | POA: Insufficient documentation

## 2013-07-13 DIAGNOSIS — M5380 Other specified dorsopathies, site unspecified: Secondary | ICD-10-CM | POA: Insufficient documentation

## 2013-07-13 DIAGNOSIS — I658 Occlusion and stenosis of other precerebral arteries: Secondary | ICD-10-CM | POA: Insufficient documentation

## 2013-07-13 DIAGNOSIS — M47817 Spondylosis without myelopathy or radiculopathy, lumbosacral region: Secondary | ICD-10-CM | POA: Insufficient documentation

## 2013-07-13 DIAGNOSIS — R5381 Other malaise: Secondary | ICD-10-CM | POA: Insufficient documentation

## 2013-07-21 ENCOUNTER — Ambulatory Visit (HOSPITAL_COMMUNITY)
Admission: RE | Admit: 2013-07-21 | Discharge: 2013-07-21 | Disposition: A | Discharge status: Home / Self Care | Payer: Medicare Other | Source: Ambulatory Visit | Attending: Adult Health Nurse Practitioner | Admitting: Adult Health Nurse Practitioner

## 2013-07-21 DIAGNOSIS — IMO0002 Reserved for concepts with insufficient information to code with codable children: Secondary | ICD-10-CM

## 2013-07-21 DIAGNOSIS — G459 Transient cerebral ischemic attack, unspecified: Secondary | ICD-10-CM | POA: Insufficient documentation

## 2013-07-21 DIAGNOSIS — M542 Cervicalgia: Secondary | ICD-10-CM | POA: Insufficient documentation

## 2013-07-21 DIAGNOSIS — G9389 Other specified disorders of brain: Secondary | ICD-10-CM | POA: Insufficient documentation

## 2013-07-21 DIAGNOSIS — M5380 Other specified dorsopathies, site unspecified: Secondary | ICD-10-CM | POA: Insufficient documentation

## 2013-07-21 DIAGNOSIS — M4802 Spinal stenosis, cervical region: Secondary | ICD-10-CM | POA: Insufficient documentation

## 2013-07-21 DIAGNOSIS — M47812 Spondylosis without myelopathy or radiculopathy, cervical region: Secondary | ICD-10-CM | POA: Insufficient documentation

## 2013-07-21 DIAGNOSIS — M5030 Other cervical disc degeneration, unspecified cervical region: Secondary | ICD-10-CM | POA: Insufficient documentation

## 2013-07-23 ENCOUNTER — Ambulatory Visit (HOSPITAL_COMMUNITY): Payer: Medicare Other

## 2013-07-23 ENCOUNTER — Ambulatory Visit (HOSPITAL_COMMUNITY)
Admission: RE | Admit: 2013-07-23 | Discharge: 2013-07-23 | Disposition: A | Discharge status: Home / Self Care | Payer: Medicare Other | Source: Ambulatory Visit | Attending: Adult Health Nurse Practitioner | Admitting: Adult Health Nurse Practitioner

## 2013-07-23 DIAGNOSIS — M6789 Other specified disorders of synovium and tendon, multiple sites: Secondary | ICD-10-CM | POA: Insufficient documentation

## 2013-07-23 DIAGNOSIS — M47817 Spondylosis without myelopathy or radiculopathy, lumbosacral region: Secondary | ICD-10-CM | POA: Insufficient documentation

## 2013-07-23 DIAGNOSIS — M25559 Pain in unspecified hip: Secondary | ICD-10-CM | POA: Insufficient documentation

## 2013-07-26 ENCOUNTER — Encounter: Payer: Self-pay | Admitting: Cardiology

## 2013-07-26 DIAGNOSIS — I779 Disorder of arteries and arterioles, unspecified: Secondary | ICD-10-CM | POA: Insufficient documentation

## 2013-07-26 DIAGNOSIS — I739 Peripheral vascular disease, unspecified: Secondary | ICD-10-CM

## 2013-07-30 ENCOUNTER — Encounter (INDEPENDENT_AMBULATORY_CARE_PROVIDER_SITE_OTHER): Payer: Self-pay

## 2013-07-30 ENCOUNTER — Encounter: Payer: Self-pay | Admitting: Neurology

## 2013-07-30 ENCOUNTER — Ambulatory Visit (INDEPENDENT_AMBULATORY_CARE_PROVIDER_SITE_OTHER): Payer: Medicare Other | Admitting: Neurology

## 2013-07-30 VITALS — BP 165/80 | HR 61 | Ht <= 58 in | Wt 146.0 lb

## 2013-07-30 DIAGNOSIS — F05 Delirium due to known physiological condition: Principal | ICD-10-CM

## 2013-07-30 DIAGNOSIS — R410 Disorientation, unspecified: Secondary | ICD-10-CM

## 2013-07-30 MED ORDER — LEVETIRACETAM 500 MG PO TABS: 500.0000 mg | ORAL_TABLET | Freq: Two times a day (BID) | ORAL | Status: DC

## 2013-07-30 NOTE — Progress Notes (Signed)
[facility]  PATIENT: Katie Ford DOB: [birth year]  HISTORICAL  Katie Ford is a 80 years old right-handed Caucasian female, accompanied by her sister, full daughters, referred by emergency room, and her primary care physician Dr. Deirdre Pippins for evaluation of recent onset confusion spells  She had a past medical history of anxiety, mitral valve regurgitation, coronary artery disease,cardiologist is Dr. Ron Parker, she is on effient digoxin treatment.  She is a retired Careers information officer, remained to be physically active, including swimming regularly, she did has mild gait difficulty after her left hip fracture, and fixation surgery in  2012, she lives with her sister, prior her most recent hospital presentation in December 20 first,2014,she is very active, balance her checkbook,drive long distance without difficulty.  In December 20 is 2014, she was noticed to have difficulty  Sorted out her medications, got very frustrated, she complains of not feeling well, went to bed early that night, woke up around 1 AM in the morning, confused, she went to her sister's bedroom, on asking her where she is, later she went to bed, was noted by her family December 20 first increased confusion, did not know where she is, she was taken to hospital, CAT scan of the brain showed previous old stroke in the left basal ganglion, chronic sinusitis, small vessel disease, no acute stroke,  Ultrasound of carotid arteries showed less than 50% stenosis of bilateral internal carotid artery  Bilateral lower extremity venous Doppler showed no DVT in bilateral lower extremity,  Better hip as demonstrated previous left hip surgery, no evidence of acute process,  She was treated with antibiotics Keflex for about 10 days, over the past 2 weeks, she has improved, but not back to her baseline, she continued to have intermittent episodes of confusion, eye blinking spells, lasting for a few minutes, followed by extreme  fatigue, tends to sleep afterwards  I have reviewed MRI together with patient's and her family, MRI of the brain has demonstrated  Positive diffusion imaging shows a 5 mm focus of acute infarction in the midline body of the corpus callosum. No other acute infarction.  There chronic small-vessel changes affecting the pons.  There are moderate changes of chronic small vessel disease affecting the deep and subcortical cerebral hemispheric white matter. There was diffuse atrophy  MRI cervical as demonstrated multilevel degenerative disc disease, most severe at C5 and 6, with mild to moderate canal stenosis  No cord signal abnormality  She denies a previous history of seizure   REVIEW OF SYSTEMS: Full 14 system review of systems performed and notable only for confusion episodes, weight loss, blurry vision, easy bruising, joint pain, joint swelling, achy muscles, energy, memory loss, confusion, headaches, dizziness,  ALLERGIES: Allergies  Allergen Reactions  . Celecoxib Other (See Comments)    TACHYCARDIA  . Codeine Other (See Comments)    TACHYCARIDIA  . Hydrocodone Other (See Comments)    TACHYCARDIA  . Tartrazine Other (See Comments)    Causes Tachycardia  . Aspirin     ONLY TARTRAZINE!!! Causes Tachycardia    HOME MEDICATIONS: Outpatient Prescriptions Prior to Visit  Medication Sig Dispense Refill  . acetaminophen (TYLENOL) 325 MG tablet Take 325 mg by mouth every 6 (six) hours as needed for pain.      Marland Kitchen ADVAIR DISKUS 250-50 MCG/DOSE AEPB Inhale 1 puff into the lungs every morning.      Marland Kitchen albuterol (PROAIR HFA) 108 (90 BASE) MCG/ACT inhaler Inhale 2 puffs into the lungs every 6 (six)  hours as needed. FOR SHORTNESS OF BREATH      . ALPRAZolam (XANAX) 0.5 MG tablet Take 0.25-0.5 mg by mouth daily as needed. TAKE HALF TO ONE  A TABLET IF NEEDED FOR ANXIETY       . amLODipine (NORVASC) 10 MG tablet Take 10 mg by mouth every morning.       Marland Kitchen CALCIUM-MAGNESUIUM-ZINC 333-133-8.3 MG TABS  Take 1 tablet by mouth every morning.      . cetirizine (ZYRTEC) 10 MG tablet Take 5 mg by mouth 2 (two) times daily.       . diclofenac-misoprostol (ARTHROTEC 75) 75-0.2 MG per tablet Take 1 tablet by mouth 2 (two) times daily.        . digoxin (LANOXIN) 0.25 MG tablet Take 250 mcg by mouth every morning.       Marland Kitchen EFFIENT 5 MG TABS TAKE ONE TABLET BY MOUTH ONE TIME DAILY  30 each  11  . esomeprazole (NEXIUM) 40 MG capsule Take 1 capsule (40 mg total) by mouth daily before breakfast.  30 capsule  11  . fluticasone (FLOVENT HFA) 44 MCG/ACT inhaler Inhale 1 puff into the lungs 2 (two) times daily.        Marland Kitchen levothyroxine (SYNTHROID, LEVOTHROID) 100 MCG tablet Take 100 mcg by mouth daily.      . metoprolol (TOPROL-XL) 50 MG 24 hr tablet Take 50-100 mg by mouth 2 (two) times daily. 50mg  every morning and 100mg  every evening.      . nitroGLYCERIN (NITROSTAT) 0.4 MG SL tablet Place 0.4 mg under the tongue every 5 (five) minutes as needed.        . Polyvinyl Alcohol-Povidone (MURINE TEARS FOR DRY EYES) 5-6 MG/ML SOLN Place 1 drop into both eyes every morning.       . traMADol (ULTRAM) 50 MG tablet Take 50 mg by mouth every 4 (four) hours as needed for moderate pain.       . traMADol (ULTRAM) 50 MG tablet Take 1 tablet (50 mg total) by mouth every 6 (six) hours as needed.  20 tablet  0   No facility-administered medications prior to visit.    PAST MEDICAL HISTORY: Past Medical History  Diagnosis Date  . Ejection fraction     EF 65 %... normal... catheterization 02/2010.  . Mitral regurgitation     mild, echo, June, 2007  . Paroxysmal supraventricular tachycardia     infrequent episodes over the years..palpitations.  . Hypertension   . Asthmatic bronchitis   . Obesity   . CAD (coronary artery disease)     bare metal stents to 2 separate RCA lesions August, 2011, residual 60% LAD, medical therapy  /  plan was for effient for one month followed by Plavix,, but patient does not tolerate Plavix...  therefore patient placed back on effient  . GERD (gastroesophageal reflux disease)   . Edema 03/29/2010    September, 2011  . Intolerance of drug     aspirin and plavix  . Hypothyroidism 04/24/2011  . Hyperglycemia 04/24/2011  . Fracture of rib of left side 04/24/2011  . Hip fracture     October, 2012  . Diverticulitis     remote hx  . Diabetes mellitus     does not have  . Carotid artery disease     PAST SURGICAL HISTORY: Past Surgical History  Procedure Laterality Date  . Knee arthroscopy  11/2001    left knee  . Laparoscopic cholecystectomy  04/29/1996    [facility] hospital  .  Tubal ligation  09/1970  . [place]  12/[place]    [facility]  . Sp arthro thumb*r*  05/1989    [facility]  . Total abdominal [place]rectomy  1983    [place]  . Cardiac catheterization  02/22/2010    2 stents, [place]  . Knee arthroscopy  02/19/2011    Procedure: ARTHROSCOPY KNEE;  Surgeon: Sanjuana Kava;  Location: [facility];  Service: Orthopedics;  Laterality: Right;  Medial and Lateral Partial Menisectomy  . Arthroplasty w/ arthroscopy medial / lateral compartment knee      [facility], should read arthroscopy not arthroplasty  . Orif hip fracture  04/25/2011    Procedure: OPEN REDUCTION INTERNAL FIXATION HIP;  Surgeon: Sanjuana Kava;  Location: [facility];  Service: Orthopedics;  Laterality: Left;  . Ileocolonscopy  12/06/11    fields:diverticula throughtout the colon/inertnal hemorrhoids/  . Esophagogastroduodenoscopy  12/06/11    fields: Negative H. pylori, hiatal hernia/moderate gastritis    FAMILY HISTORY: Family History  Problem Relation Age of Onset  . Diabetes Mother   . Arthritis Mother   . Heart attack Father   . Stroke Father   . Emphysema Father   . Cataracts Father   . Cancer Sister   . Heart disease Sister   . Arthritis Sister   . Arthritis Brother   . Allergies Brother   . Colon cancer Neg Hx     SOCIAL HISTORY:  History   Social History  . Marital Status: Widowed    Spouse Name: N/A    Number of  Children: 6  . Years of Education: college   Occupational History  . [employer]     Education officer, museum   Social History Main Topics  . Smoking status: Never Smoker   . Smokeless tobacco: Never Used  . Alcohol Use: 0.6 oz/week    1 Glasses of wine per week     Comment: rarely  . Drug Use: No  . Sexual Activity: No   Other Topics Concern  . 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   Right handed.   Caffeine. None   Patient lives at home with her sister Bartolo Darter)            PHYSICAL EXAM   Filed Vitals:   07/30/13 1432  BP: 165/80  Pulse: 61  Height: 4\' 10"  (1.473 m)  Weight: 146 lb (66.225 kg)    Not recorded    Body mass index is 30.52 kg/(m^2).   Generalized: In no acute distress  Neck: Supple, no carotid bruits   Cardiac: Regular rate rhythm  Pulmonary: Clear to auscultation bilaterally  Musculoskeletal: No deformity  Neurological examination  Mentation: Alert oriented to time, place, history taking, and causual conversation,  Mini-Mental Status Examination is  28 out of 30, she has difficulty with 1 out of 3 recalls, has difficulty spelling world backwards,  Cranial nerve II-XII: Pupils were equal round reactive to light extraocular movements were full, Visual field were full on confrontational test. Bilateral fundi were sharp.  Facial sensation and strength were normal. Hearing was intact to finger rubbing bilaterally. Uvula tongue midline.  head turning and shoulder shrug and were normal and symmetric.Tongue protrusion into cheek strength was normal.  Motor: normal tone, bulk and strength.  Sensory: Intact to fine touch, pinprick, preserved vibratory sensation, and proprioception at toes.  Coordination: Normal finger to nose, heel-to-shin bilaterally there was no truncal ataxia  Gait: Rising  up from seated position by pushing on chair arm, cautious, mildly atalgic   Romberg signs:  Negative  Deep tendon reflexes: Brachioradialis 2/2, biceps 2/2, triceps 2/2, patellar 2/2, Achilles 2/2, plantar responses were flexor bilaterally.   DIAGNOSTIC DATA (LABS, IMAGING, TESTING) - I reviewed patient records, labs, notes, testing and imaging myself where available.  Lab Results  Component Value Date   WBC 9.0 07/11/2013   HGB 13.0 07/11/2013   HCT 39.9 07/11/2013   MCV 90.7 07/11/2013   PLT 251 07/11/2013      Component Value Date/Time   NA 137 07/11/2013 1346   K 4.1 07/11/2013 1346   CL 99 07/11/2013 1346   CO2 29 07/11/2013 1346   GLUCOSE 106* 07/11/2013 1346   BUN 14 07/11/2013 1346   CREATININE 0.96 07/11/2013 1346   CALCIUM 9.2 07/11/2013 1346   PROT 7.6 06/10/2013 1353   ALBUMIN 3.6 06/10/2013 1353   AST 15 06/10/2013 1353   ALT 14 06/10/2013 1353   ALKPHOS 96 06/10/2013 1353   BILITOT 0.2* 06/10/2013 1353   GFRNONAA 55* 07/11/2013 1346   GFRAA 64* 07/11/2013 1346   Lab Results  Component Value Date   CHOL  Value: 132        ATP III CLASSIFICATION:  <200     mg/dL   Desirable  200-239  mg/dL   Borderline High  >=240    mg/dL   High        02/23/2010   HDL 33* 02/23/2010   LDLCALC  Value: 62        Total Cholesterol/HDL:CHD Risk Coronary Heart Disease Risk Table                     Men   Women  1/2 Average Risk   3.4   3.3  Average Risk       5.0   4.4  2 X Average Risk   9.6   7.1  3 X Average Risk  23.4   11.0        Use the calculated Patient Ratio above and the CHD Risk Table to determine the patient's CHD Risk.        ATP III CLASSIFICATION (LDL):  <100     mg/dL   Optimal  100-129  mg/dL   Near or Above                    Optimal  130-159  mg/dL   Borderline  160-189  mg/dL   High  >190     mg/dL   Very High 02/23/2010   TRIG 184* 02/23/2010   CHOLHDL 4.0 02/23/2010   Lab Results  Component Value Date   HGBA1C 6.6* 04/25/2011   No results found for this basename: VITAMINB12   Lab Results  Component Value Date   TSH 2.445 04/24/2011      ASSESSMENT  AND PLAN   80 years old Caucasian female, presenting with acute confusion, in the setting of mild memory trouble, acute sinusitis, antibiotic treatment, MRI of the brain has demonstrates diffuse atrophy, moderate small vessel disease,   Also one tiny acute stroke involving corpus callosum, which would not explain her current symptoms,  1.   Differentiation diagnosis including complex partial seizure,vs. Acute contusion delirium due to metabolic toxic reasons, such as infection on top of mild cognitive impairments  2.  EEG 3.  MRA of the brain, 4.  Echocardiogram 5.   Return to clinic in one month's  6.   keppra 500 mg twice a day   Marcial Pacas, M.D. Ph.D.  [facility] [address] [place], [place] [address] [phone]

## 2013-08-03 ENCOUNTER — Ambulatory Visit (INDEPENDENT_AMBULATORY_CARE_PROVIDER_SITE_OTHER): Payer: Medicare Other

## 2013-08-03 DIAGNOSIS — R410 Disorientation, unspecified: Secondary | ICD-10-CM

## 2013-08-03 DIAGNOSIS — F05 Delirium due to known physiological condition: Principal | ICD-10-CM

## 2013-08-05 ENCOUNTER — Ambulatory Visit (INDEPENDENT_AMBULATORY_CARE_PROVIDER_SITE_OTHER): Payer: Medicare Other | Admitting: Cardiology

## 2013-08-05 ENCOUNTER — Telehealth: Payer: Self-pay | Admitting: Neurology

## 2013-08-05 DIAGNOSIS — I059 Rheumatic mitral valve disease, unspecified: Secondary | ICD-10-CM

## 2013-08-05 DIAGNOSIS — R410 Disorientation, unspecified: Secondary | ICD-10-CM

## 2013-08-05 DIAGNOSIS — I379 Nonrheumatic pulmonary valve disorder, unspecified: Secondary | ICD-10-CM

## 2013-08-05 DIAGNOSIS — I369 Nonrheumatic tricuspid valve disorder, unspecified: Secondary | ICD-10-CM

## 2013-08-05 DIAGNOSIS — F05 Delirium due to known physiological condition: Principal | ICD-10-CM

## 2013-08-05 DIAGNOSIS — I2510 Atherosclerotic heart disease of native coronary artery without angina pectoris: Secondary | ICD-10-CM

## 2013-08-06 NOTE — Procedures (Signed)
HISTORY: 80 years old female with acute confusion in the setting of mild memory disturbance  TECHNIQUE:  16 channel EEG was performed based on standard 10-16 international system. One channel was dedicated to EKG, which has demonstrates normal sinus rhythm of 66 beats per minutes.  Upon awakening, the posterior background activity was well-developed, in alpha range, 9 Hz,  reactive to eye opening and closure. There was frequent bilateral frontal muscle artifact  There was no evidence of epilepsy for discharge.  Photic stimulation was performed, which induced a symmetric photic driving.  Hyperventilation was performed, there was no abnormality elicit.  No sleep was achieved.  CONCLUSION: This is a  normal awake EEG.  There is no electrodiagnostic evidence of epileptiform discharge

## 2013-08-06 NOTE — Telephone Encounter (Signed)
Please call patient, ECHO showed moderate focal basal hypertrophy without dynamic obstruction. Systolic function was normal. The estimated ejection fraction was in the range of 60% to 65%. Doppler parameters are consistent with abnormal left ventricular relaxation (grade 1 diastolic dysfunction).  Mild abnormality on ECHO consistent with her known history of mild mitral valve regurgitations, no thrombus, no change in treatment plan ,

## 2013-08-09 NOTE — Telephone Encounter (Signed)
Spoke to patient's daughter and relayed mild abnormal ECHO consistent with her hx of mild mitral valve regurgitations, per Dr. Krista Blue

## 2013-08-09 NOTE — Progress Notes (Signed)
Quick Note:  Spoke to patient's daughter and relayed mild abnormal ECHO, per Dr. Krista Blue. See telephone note. ______

## 2013-08-11 ENCOUNTER — Ambulatory Visit
Admission: RE | Admit: 2013-08-11 | Discharge: 2013-08-11 | Disposition: A | Discharge status: Home / Self Care | Payer: Medicare Other | Source: Ambulatory Visit | Attending: Neurology | Admitting: Neurology

## 2013-08-11 ENCOUNTER — Other Ambulatory Visit: Payer: Self-pay | Admitting: Neurology

## 2013-08-11 DIAGNOSIS — F05 Delirium due to known physiological condition: Principal | ICD-10-CM

## 2013-08-11 DIAGNOSIS — R410 Disorientation, unspecified: Secondary | ICD-10-CM

## 2013-08-17 ENCOUNTER — Other Ambulatory Visit: Payer: Self-pay | Admitting: Specialist

## 2013-08-17 DIAGNOSIS — M5416 Radiculopathy, lumbar region: Secondary | ICD-10-CM

## 2013-08-17 DIAGNOSIS — G8929 Other chronic pain: Secondary | ICD-10-CM

## 2013-08-23 ENCOUNTER — Encounter: Payer: Self-pay | Admitting: Cardiology

## 2013-08-27 ENCOUNTER — Ambulatory Visit
Admission: RE | Admit: 2013-08-27 | Discharge: 2013-08-27 | Disposition: A | Discharge status: Home / Self Care | Payer: Medicare Other | Source: Ambulatory Visit | Attending: Specialist | Admitting: Specialist

## 2013-08-27 DIAGNOSIS — G8929 Other chronic pain: Secondary | ICD-10-CM

## 2013-08-27 DIAGNOSIS — M5416 Radiculopathy, lumbar region: Secondary | ICD-10-CM

## 2013-09-02 ENCOUNTER — Ambulatory Visit (INDEPENDENT_AMBULATORY_CARE_PROVIDER_SITE_OTHER): Payer: Medicare Other | Admitting: Neurology

## 2013-09-02 ENCOUNTER — Encounter: Payer: Self-pay | Admitting: Neurology

## 2013-09-02 VITALS — BP 149/81 | HR 61 | Ht 59.0 in | Wt 149.0 lb

## 2013-09-02 DIAGNOSIS — E039 Hypothyroidism, unspecified: Secondary | ICD-10-CM

## 2013-09-02 DIAGNOSIS — I10 Essential (primary) hypertension: Secondary | ICD-10-CM

## 2013-09-02 DIAGNOSIS — G40209 Localization-related (focal) (partial) symptomatic epilepsy and epileptic syndromes with complex partial seizures, not intractable, without status epilepticus: Secondary | ICD-10-CM | POA: Insufficient documentation

## 2013-09-02 MED ORDER — LEVETIRACETAM 500 MG PO TABS: 500.0000 mg | ORAL_TABLET | Freq: Two times a day (BID) | ORAL | Status: DC

## 2013-09-02 NOTE — Progress Notes (Signed)
[facility]  PATIENT: Katie Ford DOB: 07/30/1931  HISTORICAL  Katie Ford is a 80 years old right-handed Caucasian female, accompanied by her sister, four daughters, referred by emergency room, and her primary care physician Dr. Deirdre Pippins for evaluation of recent onset confusion spells  She had a past medical history of anxiety, mitral valve regurgitation, coronary artery disease,cardiologist is Dr. Ron Parker, she is on effient digoxin treatment.  She is a retired Careers information officer, remained to be physically active, including swimming regularly, she did has mild gait difficulty after her left hip fracture, and fixation surgery in  2012, she lives with her sister, prior her most recent hospital presentation in July 11 2013,she is very active, balance her checkbook,drive long distance without difficulty.  In July 11 2013, she was noticed to have difficulty sorted out her medications, got very frustrated, she complains of not feeling well, went to bed early that night, woke up around 1 AM in the morning, confused, she went to her sister's bedroom, on asking her where she is, later she went to bed, was noted by her family December 21st increased confusion, did not know where she is, she was taken to hospital, CAT scan of the brain showed previous old stroke in the left basal ganglion, chronic sinusitis, small vessel disease, no acute stroke,  Ultrasound of carotid arteries showed less than 50% stenosis of bilateral internal carotid artery  Bilateral lower extremity venous Doppler showed no DVT in bilateral lower extremity,  Bilateral hip X-ray demonstrated previous left hip surgery, no evidence of acute process,  She was treated with antibiotics Keflex for about 10 days, over the past 2 weeks, she has improved, but not back to her baseline, she continued to have intermittent episodes of confusion, eye blinking spells, lasting for a few minutes, followed by extreme fatigue,  tends to sleep afterwards  I have reviewed MRI together with patient's and her family, MRI of the brain has demonstrated positive diffusion imaging shows a 5 mm focus of acute infarction in the midline body of the corpus callosum. No other acute infarction.  There chronic small-vessel changes affecting the pons.  There are moderate changes of chronic small vessel disease affecting the deep and subcortical cerebral hemispheric white matter. There was diffuse atrophy  She denies a previous history of seizure  UPDATE Sep 02, 2013: MRA of the brain was normal.    She fell, was taken to the emergency room, had MRI of pelvic showed No acute findings related to recent fall. Moderate right gluteus minimus tendinosis with adjacent bursal fluid. No tendon tear demonstrated. Left pelvic muscular atrophy related to remote left proximal femoral ORIF.  Lower lumbar spondylosis with chronic resulting foraminal  Stenosis.  MRI cervical in December 2014 Spondylosis at C5-6 contributes to mild central and moderate biforaminal stenosis.  Sometimes, urinary urgency since 2012, no change.   She was started on Keppra, 500mg  bid, she feels more alert, is also confirmed by her sister,  no more confusion episodes, no more spells,   REVIEW OF SYSTEMS: Full 14 system review of systems performed and notable only for mild gait difficulty, knee pain   ALLERGIES: Allergies  Allergen Reactions  . Celecoxib Other (See Comments)    TACHYCARDIA  . Codeine Other (See Comments)    TACHYCARIDIA  . Hydrocodone Other (See Comments)    TACHYCARDIA  . Tartrazine Other (See Comments)    Causes Tachycardia  . Aspirin     ONLY TARTRAZINE!!! Causes Tachycardia  HOME MEDICATIONS: Outpatient Prescriptions Prior to Visit  Medication Sig Dispense Refill  . acetaminophen (TYLENOL) 325 MG tablet Take 325 mg by mouth every 6 (six) hours as needed for pain.      Marland Kitchen ADVAIR DISKUS 250-50 MCG/DOSE AEPB Inhale 1 puff into the lungs  every morning.      Marland Kitchen albuterol (PROAIR HFA) 108 (90 BASE) MCG/ACT inhaler Inhale 2 puffs into the lungs every 6 (six) hours as needed. FOR SHORTNESS OF BREATH      . ALPRAZolam (XANAX) 0.5 MG tablet Take 0.25-0.5 mg by mouth daily as needed. TAKE HALF TO ONE  A TABLET IF NEEDED FOR ANXIETY       . amLODipine (NORVASC) 10 MG tablet Take 10 mg by mouth every morning.       Marland Kitchen BAYER CONTOUR TEST test strip       . CALCIUM-MAGNESUIUM-ZINC 333-133-8.3 MG TABS Take 1 tablet by mouth every morning.      . cephALEXin (KEFLEX) 500 MG capsule 500 mg daily.      . cetirizine (ZYRTEC) 10 MG tablet Take 5 mg by mouth 2 (two) times daily.       . diclofenac-misoprostol (ARTHROTEC 75) 75-0.2 MG per tablet Take 1 tablet by mouth 2 (two) times daily.        . digoxin (LANOXIN) 0.25 MG tablet Take 250 mcg by mouth every morning.       Marland Kitchen EFFIENT 5 MG TABS TAKE ONE TABLET BY MOUTH ONE TIME DAILY  30 each  11  . esomeprazole (NEXIUM) 40 MG capsule Take 1 capsule (40 mg total) by mouth daily before breakfast.  30 capsule  11  . fluticasone (FLONASE) 50 MCG/ACT nasal spray       . fluticasone (FLOVENT HFA) 44 MCG/ACT inhaler Inhale 1 puff into the lungs 2 (two) times daily.        Marland Kitchen levETIRAcetam (KEPPRA) 500 MG tablet Take 1 tablet (500 mg total) by mouth 2 (two) times daily.  60 tablet  12  . levothyroxine (SYNTHROID, LEVOTHROID) 100 MCG tablet Take 100 mcg by mouth daily.      . metoprolol (TOPROL-XL) 50 MG 24 hr tablet Take 50-100 mg by mouth 2 (two) times daily. 50mg  every morning and 100mg  every evening.      . nitroGLYCERIN (NITROSTAT) 0.4 MG SL tablet Place 0.4 mg under the tongue every 5 (five) minutes as needed.        . Polyvinyl Alcohol-Povidone (MURINE TEARS FOR DRY EYES) 5-6 MG/ML SOLN Place 1 drop into both eyes every morning.       . traMADol (ULTRAM) 50 MG tablet Take 50 mg by mouth every 4 (four) hours as needed for moderate pain.       . traMADol (ULTRAM) 50 MG tablet Take 1 tablet (50 mg total) by  mouth every 6 (six) hours as needed.  20 tablet  0   No facility-administered medications prior to visit.    PAST MEDICAL HISTORY: Past Medical History  Diagnosis Date  . Ejection fraction     EF 65 %... normal... catheterization 02/2010.  . Mitral regurgitation     mild, echo, June, 2007  . Paroxysmal supraventricular tachycardia     infrequent episodes over the years..palpitations.  . Hypertension   . Asthmatic bronchitis   . Obesity   . CAD (coronary artery disease)     bare metal stents to 2 separate RCA lesions August, 2011, residual 60% LAD, medical therapy  /  plan was  for effient for one month followed by Plavix,, but patient does not tolerate Plavix... therefore patient placed back on effient  . GERD (gastroesophageal reflux disease)   . Edema 03/29/2010    September, 2011  . Intolerance of drug     aspirin and plavix  . Hypothyroidism 04/24/2011  . Hyperglycemia 04/24/2011  . Fracture of rib of left side 04/24/2011  . Hip fracture     October, 2012  . Diverticulitis     remote hx  . Diabetes mellitus     does not have  . Carotid artery disease     PAST SURGICAL HISTORY: Past Surgical History  Procedure Laterality Date  . Knee arthroscopy  11/2001    left knee  . Laparoscopic cholecystectomy  04/29/1996    [facility]  . Tubal ligation  09/1970  . [place]le fracture [place]  [place]    [place]  . Sp arthro thumb*r*  05/[place]    [place]  . [place] abdominal hysterectomy  1983    [place]  . Cardiac catheterization  02/22/2010    2 stents, [place]  . Knee arthroscopy  02/19/2011    Procedure: ARTHROSCOPY KNEE;  Surgeon: Sanjuana Kava;  Location: [facility];  Service: Orthopedics;  Laterality: Right;  Medial and Lateral Partial Menisectomy  . Arthroplasty w/ arthroscopy medial / lateral compartment knee      [facility], should read arthroscopy not arthroplasty  . Orif hip fracture  04/25/2011    Procedure: OPEN REDUCTION INTERNAL FIXATION HIP;  Surgeon: Sanjuana Kava;  Location: [facility];  Service:  Orthopedics;  Laterality: Left;  . Ileocolonscopy  12/06/11    fields:diverticula throughtout the colon/inertnal hemorrhoids/  . Esophagogastroduodenoscopy  12/06/11    fields: Negative H. pylori, hiatal hernia/moderate gastritis    FAMILY HISTORY: Family History  Problem Relation Age of Onset  . Diabetes Mother   . Arthritis Mother   . Heart attack Father   . Stroke Father   . Emphysema Father   . Cataracts Father   . Cancer Sister   . Heart disease Sister   . Arthritis Sister   . Arthritis Brother   . Allergies Brother   . Colon cancer Neg Hx     SOCIAL HISTORY:  History   Social History  . Marital Status: Widowed    Spouse Name: N/A    Number of Children: 6  . Years of Education: college   Occupational History  . [employer]     Education officer, museum   Social History Main Topics  . Smoking status: Never Smoker   . Smokeless tobacco: Never Used  . Alcohol Use: 0.6 oz/week    1 Glasses of wine per week     Comment: rarely  . Drug Use: No  . Sexual Activity: No   Other Topics Concern  . 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   Right handed.   Caffeine. None   Patient lives at home with her sister Bartolo Darter)            PHYSICAL EXAM   Filed Vitals:   09/02/13 1457  BP: 149/81  Pulse: 61  Height: 4\' 11"  (1.499 m)  Weight: 149 lb (67.586 kg)    Not recorded    Body mass index is 30.08 kg/(m^2).   Generalized: In no acute distress  Neck: Supple, no carotid bruits   Cardiac: Regular rate rhythm  Pulmonary: Clear to auscultation bilaterally  Musculoskeletal:  No deformity  Neurological examination  Mentation: Alert oriented to time, place, history taking, and causual conversation,  Mini-Mental Status Examination is  28 out of 30, she has difficulty with 1 out of 3 recalls, has difficulty spelling world backwards,  Cranial nerve II-XII: Pupils were equal round reactive to light  extraocular movements were full, Visual field were full on confrontational test. Bilateral fundi were sharp.  Facial sensation and strength were normal. Hearing was intact to finger rubbing bilaterally. Uvula tongue midline.  head turning and shoulder shrug and were normal and symmetric.Tongue protrusion into cheek strength was normal.  Motor: normal tone, bulk and strength.  Sensory: Intact to fine touch, pinprick, preserved vibratory sensation, and proprioception at toes.  Coordination: Normal finger to nose, heel-to-shin bilaterally there was no truncal ataxia  Gait: Rising up from seated position by pushing on chair arm, cautious, mildly atalgic   Romberg signs: Negative  Deep tendon reflexes: Brachioradialis 2/2, biceps 2/2, triceps 2/2, patellar 2/2, Achilles 2/2, plantar responses were flexor bilaterally.   DIAGNOSTIC DATA (LABS, IMAGING, TESTING) - I reviewed patient records, labs, notes, testing and imaging myself where available.  Lab Results  Component Value Date   WBC 9.0 07/11/2013   HGB 13.0 07/11/2013   HCT 39.9 07/11/2013   MCV 90.7 07/11/2013   PLT 251 07/11/2013      Component Value Date/Time   NA 137 07/11/2013 1346   K 4.1 07/11/2013 1346   CL 99 07/11/2013 1346   CO2 29 07/11/2013 1346   GLUCOSE 106* 07/11/2013 1346   BUN 14 07/11/2013 1346   CREATININE 0.96 07/11/2013 1346   CALCIUM 9.2 07/11/2013 1346   PROT 7.6 06/10/2013 1353   ALBUMIN 3.6 06/10/2013 1353   AST 15 06/10/2013 1353   ALT 14 06/10/2013 1353   ALKPHOS 96 06/10/2013 1353   BILITOT 0.2* 06/10/2013 1353   GFRNONAA 55* 07/11/2013 1346   GFRAA 64* 07/11/2013 1346   Lab Results  Component Value Date   CHOL  Value: 132        ATP III CLASSIFICATION:  <200     mg/dL   Desirable  200-239  mg/dL   Borderline High  >=240    mg/dL   High        02/23/2010   HDL 33* 02/23/2010   LDLCALC  Value: 62        Total Cholesterol/HDL:CHD Risk Coronary Heart Disease Risk Table                     Men    Women  1/2 Average Risk   3.4   3.3  Average Risk       5.0   4.4  2 X Average Risk   9.6   7.1  3 X Average Risk  23.4   11.0        Use the calculated Patient Ratio above and the CHD Risk Table to determine the patient's CHD Risk.        ATP III CLASSIFICATION (LDL):  <100     mg/dL   Optimal  100-129  mg/dL   Near or Above                    Optimal  130-159  mg/dL   Borderline  160-189  mg/dL   High  >190     mg/dL   Very High 02/23/2010   TRIG 184* 02/23/2010   CHOLHDL 4.0 02/23/2010   Lab Results  Component Value Date   HGBA1C 6.6* 04/25/2011   No results found for this basename: VITAMINB12   Lab Results  Component Value Date   TSH 2.445 04/24/2011      ASSESSMENT AND PLAN   80 years old Caucasian female, presenting with acute confusion, in the setting of mild memory trouble, acute sinusitis, antibiotic treatment, MRI of the brain has demonstrates diffuse atrophy, moderate small vessel disease,   also one tiny acute stroke involving corpus callosum, which would not explain her current symptoms, her confusion episode has much improved with Keppra 500 twice a day, EEG was normal,     1, above episode is most suggestive of complex partial seizure   2.  she has cervical spondylitic disease, moderate canal stenosis at the C5-6 level, she should avoid neck hyperextension,  3 return to clinic in one year .     Marcial Pacas, M.D. Ph.D.  [facility] [address] [place], [place] [address] [phone]

## 2013-10-05 ENCOUNTER — Ambulatory Visit: Payer: Medicare Other | Attending: Family Medicine | Admitting: Physical Therapy

## 2013-10-05 DIAGNOSIS — R42 Dizziness and giddiness: Secondary | ICD-10-CM | POA: Insufficient documentation

## 2013-10-05 DIAGNOSIS — IMO0001 Reserved for inherently not codable concepts without codable children: Secondary | ICD-10-CM | POA: Insufficient documentation

## 2013-10-05 DIAGNOSIS — R5381 Other malaise: Secondary | ICD-10-CM | POA: Insufficient documentation

## 2013-10-07 ENCOUNTER — Ambulatory Visit: Payer: Medicare Other | Admitting: Physical Therapy

## 2013-10-12 ENCOUNTER — Ambulatory Visit: Payer: Medicare Other | Admitting: Physical Therapy

## 2013-10-14 ENCOUNTER — Ambulatory Visit: Payer: Medicare Other | Admitting: Physical Therapy

## 2013-10-19 ENCOUNTER — Ambulatory Visit: Payer: Medicare Other | Admitting: Physical Therapy

## 2013-10-21 ENCOUNTER — Ambulatory Visit: Payer: Medicare Other | Attending: Family Medicine | Admitting: Physical Therapy

## 2013-10-21 DIAGNOSIS — IMO0001 Reserved for inherently not codable concepts without codable children: Secondary | ICD-10-CM | POA: Insufficient documentation

## 2013-10-21 DIAGNOSIS — R42 Dizziness and giddiness: Secondary | ICD-10-CM | POA: Insufficient documentation

## 2013-10-21 DIAGNOSIS — R5381 Other malaise: Secondary | ICD-10-CM | POA: Insufficient documentation

## 2013-10-26 ENCOUNTER — Ambulatory Visit: Payer: Medicare Other

## 2013-10-28 ENCOUNTER — Ambulatory Visit: Payer: Medicare Other | Admitting: Physical Therapy

## 2013-11-02 ENCOUNTER — Ambulatory Visit: Payer: Medicare Other | Admitting: *Deleted

## 2013-11-04 ENCOUNTER — Ambulatory Visit: Payer: Medicare Other | Admitting: Physical Therapy

## 2013-11-09 ENCOUNTER — Ambulatory Visit: Payer: Medicare Other | Admitting: Physical Therapy

## 2013-11-11 ENCOUNTER — Ambulatory Visit: Payer: Medicare Other | Admitting: Physical Therapy

## 2013-11-16 ENCOUNTER — Ambulatory Visit: Payer: Medicare Other | Admitting: Physical Therapy

## 2013-11-18 ENCOUNTER — Ambulatory Visit: Payer: Medicare Other | Admitting: *Deleted

## 2013-11-22 ENCOUNTER — Ambulatory Visit: Payer: Medicare Other | Attending: Family Medicine | Admitting: Physical Therapy

## 2013-11-22 DIAGNOSIS — IMO0001 Reserved for inherently not codable concepts without codable children: Secondary | ICD-10-CM | POA: Insufficient documentation

## 2013-11-22 DIAGNOSIS — R42 Dizziness and giddiness: Secondary | ICD-10-CM | POA: Insufficient documentation

## 2013-11-22 DIAGNOSIS — R5381 Other malaise: Secondary | ICD-10-CM | POA: Insufficient documentation

## 2013-11-24 ENCOUNTER — Ambulatory Visit: Payer: Medicare Other | Admitting: Physical Therapy

## 2013-12-27 ENCOUNTER — Other Ambulatory Visit: Payer: Self-pay | Admitting: Gastroenterology

## 2013-12-30 ENCOUNTER — Other Ambulatory Visit: Payer: Self-pay | Admitting: Gastroenterology

## 2013-12-30 ENCOUNTER — Encounter: Payer: Self-pay | Admitting: Cardiology

## 2014-01-03 ENCOUNTER — Ambulatory Visit (INDEPENDENT_AMBULATORY_CARE_PROVIDER_SITE_OTHER): Payer: Medicare Other | Admitting: Cardiology

## 2014-01-03 ENCOUNTER — Encounter: Payer: Self-pay | Admitting: Cardiology

## 2014-01-03 VITALS — BP 116/67 | HR 56 | Ht 61.0 in | Wt 153.0 lb

## 2014-01-03 DIAGNOSIS — I471 Supraventricular tachycardia: Secondary | ICD-10-CM

## 2014-01-03 DIAGNOSIS — I10 Essential (primary) hypertension: Secondary | ICD-10-CM

## 2014-01-03 DIAGNOSIS — I2510 Atherosclerotic heart disease of native coronary artery without angina pectoris: Secondary | ICD-10-CM

## 2014-01-03 MED ORDER — DIGOXIN 125 MCG PO TABS: 0.1250 mg | ORAL_TABLET | Freq: Every day | ORAL | Status: DC

## 2014-01-03 NOTE — Assessment & Plan Note (Signed)
Blood pressure is controlled. No change in therapy. 

## 2014-01-03 NOTE — Assessment & Plan Note (Signed)
Coronary disease is stable. No change in therapy. 

## 2014-01-03 NOTE — Progress Notes (Signed)
Patient ID: Katie Ford, female   DOB: [birth year], 80 y.o.   MRN: [id number]    HPI  Patient is seen today to followup overall cardiac status. She does have coronary disease. She does not tolerate Plavix. She is on long-term prasugrel. She has an aspirin allergy. She has not been having any significant chest pain. In February, 2015 she was diagnosed as having complex seizures. She is receiving Keppra and she is doing well.  Allergies  Allergen Reactions  . Celecoxib Other (See Comments)    TACHYCARDIA  . Codeine Other (See Comments)    TACHYCARIDIA  . Hydrocodone Other (See Comments)    TACHYCARDIA  . Tartrazine Other (See Comments)    Causes Tachycardia  . Aspirin     ONLY TARTRAZINE!!! Causes Tachycardia    Current Outpatient Prescriptions  Medication Sig Dispense Refill  . acetaminophen (TYLENOL) 325 MG tablet Take 325 mg by mouth every 6 (six) hours as needed for pain.      Marland Kitchen ADVAIR DISKUS 250-50 MCG/DOSE AEPB Inhale 1 puff into the lungs every morning.      Marland Kitchen albuterol (PROAIR HFA) 108 (90 BASE) MCG/ACT inhaler Inhale 2 puffs into the lungs every 6 (six) hours as needed. FOR SHORTNESS OF BREATH      . ALPRAZolam (XANAX) 0.5 MG tablet Take 0.25-0.5 mg by mouth daily as needed. TAKE HALF TO ONE  A TABLET IF NEEDED FOR ANXIETY      . amLODipine (NORVASC) 10 MG tablet Take 10 mg by mouth every morning.       Marland Kitchen CALCIUM-MAGNESUIUM-ZINC 333-133-8.3 MG TABS Take 1 tablet by mouth every morning.      . cetirizine (ZYRTEC) 10 MG tablet Take 5 mg by mouth 2 (two) times daily.       . diclofenac-misoprostol (ARTHROTEC 75) 75-0.2 MG per tablet Take 1 tablet by mouth 2 (two) times daily.        . digoxin (LANOXIN) 0.25 MG tablet Take 250 mcg by mouth every morning.       . docusate sodium (COLACE) 100 MG capsule Take 100 mg by mouth 2 (two) times daily.      Marland Kitchen EFFIENT 5 MG TABS TAKE ONE TABLET BY MOUTH ONE TIME DAILY  30 each  11  . fluticasone (FLONASE) 50 MCG/ACT nasal spray       .  fluticasone (FLOVENT HFA) 44 MCG/ACT inhaler Inhale 1 puff into the lungs 2 (two) times daily.        Marland Kitchen levETIRAcetam (KEPPRA) 500 MG tablet Take 1 tablet (500 mg total) by mouth 2 (two) times daily.  60 tablet  12  . levothyroxine (SYNTHROID, LEVOTHROID) 100 MCG tablet Take 100 mcg by mouth daily.      Marland Kitchen losartan (COZAAR) 25 MG tablet Take 25 mg by mouth daily.       . metoprolol succinate (TOPROL-XL) 100 MG 24 hr tablet Take 100 mg by mouth 2 (two) times daily. Take with or immediately following a meal.      . mometasone (NASONEX) 50 MCG/ACT nasal spray Place 2 sprays into the nose daily.      Marland Kitchen NEXIUM 40 MG capsule TAKE 1 CAPSULE (40 MG TOTAL)  BY MOUTH DAILY BEFORE BREAKFAST.  30 capsule  10  . nitroGLYCERIN (NITROSTAT) 0.4 MG SL tablet Place 0.4 mg under the tongue every 5 (five) minutes as needed.        . polyethylene glycol (MIRALAX / GLYCOLAX) packet Take 17 g by mouth daily as  needed.      . Polyvinyl Alcohol-Povidone (MURINE TEARS FOR DRY EYES) 5-6 MG/ML SOLN Place 1 drop into both eyes every morning. As needed      . traMADol (ULTRAM) 50 MG tablet Take 1 tablet (50 mg total) by mouth every 6 (six) hours as needed.  20 tablet  0   No current facility-administered medications for this visit.    History   Social History  . Marital Status: Widowed    Spouse Name: N/A    Number of Children: 6  . Years of Education: college   Occupational History  . Reitred     Education officer, museum   Social History Main Topics  . Smoking status: Never Smoker   . Smokeless tobacco: Never Used  . Alcohol Use: 0.6 oz/week    1 Glasses of wine per week     Comment: rarely  . Drug Use: No  . Sexual Activity: No   Other Topics Concern  . 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   Right handed.   Caffeine. None   Patient lives at home with her sister Bartolo Darter)           Family History  Problem Relation Age of Onset  . Diabetes  Mother   . Arthritis Mother   . Heart attack Father   . Stroke Father   . Emphysema Father   . Cataracts Father   . Cancer Sister   . Heart disease Sister   . Arthritis Sister   . Arthritis Brother   . Allergies Brother   . Colon cancer Neg Hx     Past Medical History  Diagnosis Date  . Ejection fraction     EF 65 %... normal... catheterization 02/2010.  . Mitral regurgitation     mild, echo, June, 2007  . Paroxysmal supraventricular tachycardia     infrequent episodes over the years..palpitations.  . Hypertension   . Asthmatic bronchitis   . Obesity   . CAD (coronary artery disease)     bare metal stents to 2 separate RCA lesions August, 2011, residual 60% LAD, medical therapy  /  plan was for effient for one month followed by Plavix,, but patient does not tolerate Plavix... therefore patient placed back on effient  . GERD (gastroesophageal reflux disease)   . Edema 03/29/2010    September, 2011  . Intolerance of drug     aspirin and plavix  . Hypothyroidism 04/24/2011  . Hyperglycemia 04/24/2011  . Fracture of rib of left side 04/24/2011  . Hip fracture     October, 2012  . Diverticulitis     remote hx  . Diabetes mellitus     does not have  . Carotid artery disease     Past Surgical History  Procedure Laterality Date  . Knee arthroscopy  11/2001    left knee  . Laparoscopic cholecystectomy  04/29/1996    [facility] hospital  . Tubal ligation  09/1970  . [place] fracture [place]  12/[place]    [place]  . Sp arthro thumb*r*  05/[place]    [place]  . Total abdominal hysterectomy  1983    [place]  . Cardiac catheterization  02/22/2010    2 stents, [place]  . Knee arthroscopy  02/19/2011    Procedure: ARTHROSCOPY KNEE;  Surgeon: Sanjuana Kava;  Location: [facility];  Service: Orthopedics;  Laterality: Right;  Medial and Lateral Partial Menisectomy  .  Arthroplasty w/ arthroscopy medial / lateral compartment knee      [facility], should read arthroscopy not arthroplasty  . Orif hip fracture  04/25/2011     Procedure: OPEN REDUCTION INTERNAL FIXATION HIP;  Surgeon: Sanjuana Kava;  Location: [facility];  Service: Orthopedics;  Laterality: Left;  . Ileocolonscopy  12/06/11    fields:diverticula throughtout the colon/inertnal hemorrhoids/  . Esophagogastroduodenoscopy  12/06/11    fields: Negative H. pylori, hiatal hernia/moderate gastritis    Patient Active Problem List   Diagnosis Date Noted  . Ejection fraction     Priority: High  . Intolerance of drug     Priority: High  . Mitral regurgitation     Priority: High  . Paroxysmal supraventricular tachycardia     Priority: High  . Hypertension     Priority: High  . CAD (coronary artery disease)     Priority: High  . Complex partial seizure 09/02/2013  . Subacute confusional state 07/30/2013  . Carotid artery disease   . Diverticulosis 03/06/2012  . Abnormal CT of the abdomen 11/25/2011  . Abdominal pain, right lateral 11/25/2011  . Hip fracture   . UTI (urinary tract infection) 04/25/2011  . Leukocytosis 04/24/2011  . Hyponatremia 04/24/2011  . Fracture of left hip 04/24/2011  . Asthmatic bronchitis 04/24/2011  . Anemia 04/24/2011  . Hypothyroidism 04/24/2011  . DM2 (diabetes mellitus, type 2) 04/24/2011  . Fracture of rib of left side 04/24/2011  . Asthmatic bronchitis   . Obesity   . GERD (gastroesophageal reflux disease)   . Edema 03/29/2010    ROS   Patient denies fever, chills, headache, sweats, rash, change in vision, change in hearing, chest pain, cough, nausea or vomiting, urinary symptoms. She's here with a family member. All other systems are reviewed and are negative.  PHYSICAL EXAM  Patient is stable. She's here with a family member. She is oriented to person time and place. Affect is normal. Head is atraumatic. Sclera and conjunctiva are normal. There is no jugulovenous distention. Lungs are clear. Respiratory effort is nonlabored. Cardiac exam reveals S1 and S2. Abdomen is soft. Today she has trace peripheral  edema.  Filed Vitals:   01/03/14 1004  BP: 116/67  Pulse: 56  Height: 5\' 1"  (1.549 m)  Weight: 153 lb (69.4 kg)  SpO2: 98%   EKG is done today and reviewed by me. She has sinus rhythm with mild sinus bradycardia. There are nonspecific ST-T wave changes.  ASSESSMENT & PLAN

## 2014-01-03 NOTE — Patient Instructions (Signed)
Your physician recommends that you schedule a follow-up appointment in: 1 year. You will receive a reminder letter in the mail in about 10 months reminding you to call and schedule your appointment. If you don't receive this letter, please contact our office. Your physician has recommended you make the following change in your medication:  Decrease your digoxin to 0.125 mg daily. You may break your digoxin .25 mg tablets in half daily until they are finished. Your new prescription has been sent to your pharmacy. Continue all other medications the same. Your physician recommends that you have lab work in 3 weeks after digoxin dose decrease to check your digoxin level.

## 2014-01-03 NOTE — Assessment & Plan Note (Signed)
The patient has not had any major palpitations. She's been on digoxin for many many years. With recent recommendation in dose changes, her dose will be decreased to 0.125 mg daily. After that she needs a followup digoxin level in 3 weeks.

## 2014-01-05 ENCOUNTER — Other Ambulatory Visit: Payer: Self-pay | Admitting: Cardiology

## 2014-01-12 ENCOUNTER — Telehealth: Payer: Self-pay | Admitting: *Deleted

## 2014-01-12 NOTE — Telephone Encounter (Signed)
[facility] wanting a review to know if Effient is safe for pt to take before refilling medication. Please advise

## 2014-01-17 NOTE — Telephone Encounter (Signed)
Patient has CAD with stents.  She does not tolerate ASA or Plavix. She has not had a CVA. She is over 60Kg. There is an indication to use, and no contrraindication.   Therefore, it should be filled by the pharmacy.

## 2014-01-18 ENCOUNTER — Telehealth: Payer: Self-pay | Admitting: *Deleted

## 2014-01-18 MED ORDER — ESOMEPRAZOLE MAGNESIUM 40 MG PO CPDR: DELAYED_RELEASE_CAPSULE | ORAL | Status: DC

## 2014-01-18 NOTE — Telephone Encounter (Signed)
Done

## 2014-01-18 NOTE — Telephone Encounter (Signed)
Pt called wanting to get a refill on her nexium pt uses [facility] in [place]. Please advise

## 2014-01-24 MED ORDER — ESOMEPRAZOLE MAGNESIUM 40 MG PO CPDR: 40.0000 mg | DELAYED_RELEASE_CAPSULE | Freq: Two times a day (BID) | ORAL | Status: DC

## 2014-01-24 MED ORDER — ESOMEPRAZOLE MAGNESIUM 40 MG PO CPDR: DELAYED_RELEASE_CAPSULE | ORAL | Status: AC

## 2014-01-24 NOTE — Telephone Encounter (Addendum)
PT LAST SEEN OCT 2013. NEED OPV W/I NEXT 6 MOS E30 GERD. WAS ON NEXIUM BID WHICH REQUIRED A PA.

## 2014-01-24 NOTE — Addendum Note (Signed)
Addended by: Danie Binder on: 01/24/2014 09:43 PM   Modules accepted: Orders

## 2014-01-25 NOTE — Telephone Encounter (Signed)
Working on PA

## 2014-01-28 NOTE — Telephone Encounter (Signed)
Reminder in epic °

## 2014-02-01 ENCOUNTER — Telehealth: Payer: Self-pay | Admitting: *Deleted

## 2014-02-01 NOTE — Telephone Encounter (Signed)
Patient informed. 

## 2014-02-14 ENCOUNTER — Telehealth: Payer: Self-pay | Admitting: Gastroenterology

## 2014-02-14 NOTE — Telephone Encounter (Signed)
A man LMOM that patient needed her Nexium refilled [phone]

## 2014-02-16 NOTE — Telephone Encounter (Signed)
I called and left message with pt's son that the Nexium was refilled on 01/24/2014 and to check with the pharmacy.

## 2014-05-23 ENCOUNTER — Encounter: Payer: Self-pay | Admitting: Cardiology

## 2014-07-11 ENCOUNTER — Encounter: Payer: Self-pay | Admitting: Gastroenterology

## 2014-09-02 ENCOUNTER — Ambulatory Visit (INDEPENDENT_AMBULATORY_CARE_PROVIDER_SITE_OTHER): Payer: Medicare HMO | Admitting: Neurology

## 2014-09-02 ENCOUNTER — Encounter: Payer: Self-pay | Admitting: Neurology

## 2014-09-02 VITALS — BP 171/79 | HR 65 | Ht 61.0 in | Wt 155.0 lb

## 2014-09-02 DIAGNOSIS — G40209 Localization-related (focal) (partial) symptomatic epilepsy and epileptic syndromes with complex partial seizures, not intractable, without status epilepticus: Secondary | ICD-10-CM

## 2014-09-02 MED ORDER — LEVETIRACETAM 500 MG PO TABS: 500.0000 mg | ORAL_TABLET | Freq: Two times a day (BID) | ORAL | Status: DC

## 2014-09-02 NOTE — Progress Notes (Addendum)
[facility]  PATIENT: Katie Ford DOB: Nov 23, 1931  HISTORICAL  Katie Ford is a 81  years old right-handed Caucasian female, accompanied by her sister, four daughters, referred by emergency room, and her primary care physician Dr. Deirdre Pippins for evaluation of recent onset confusion spells  She had a past medical history of anxiety, mitral valve regurgitation, coronary artery disease,cardiologist is Dr. Ron Parker, she is on effient, digoxin treatment.  She is a retired Careers information officer, remained to be physically active, including swimming regularly, she did has mild gait difficulty after her left hip fracture, and fixation surgery in  2012, she lives with her sister, prior her most recent hospital presentation in July 11 2013,she is very active, balance her checkbook,drive long distance without difficulty.  In July 11 2013, she was noticed to have difficulty sorted out her medications, got very frustrated, she complains of not feeling well, went to bed early that night, woke up around 1 AM in the morning, confused, she went to her sister's bedroom, on asking her where she is, later she went to bed, was noted by her family December 21st increased confusion, did not know where she is, she was taken to hospital, CAT scan of the brain showed previous old stroke in the left basal ganglion, chronic sinusitis, small vessel disease, no acute stroke,  Ultrasound of carotid arteries showed less than 50% stenosis of bilateral internal carotid artery  Bilateral lower extremity venous Doppler showed no DVT in bilateral lower extremity,  Bilateral hip X-ray demonstrated previous left hip surgery, no evidence of acute process,  She was treated with antibiotics Keflex for sinusitis, over the past 2 weeks, she has improved, but not back to her baseline, she continued to have intermittent episodes of confusion, eye blinking spells, lasting for a few minutes, followed by extreme fatigue,  tends to sleep afterwards  I have reviewed MRI together with patient and her family, MRI of the brain has demonstrated positive diffusion imaging shows a 5 mm focus of acute infarction in the midline body of the corpus callosum. No other acute infarction.  There chronic small-vessel changes affecting the pons.  There are moderate changes of chronic small vessel disease affecting the deep and subcortical cerebral hemispheric white matter. There was diffuse atrophy  She denies a previous history of seizure  UPDATE Sep 02, 2013: MRA of the brain was normal.    She fell, was taken to the emergency room, had MRI of pelvic showed no acute findings related to recent fall. Moderate right gluteus minimus tendinosis with adjacent bursal fluid. No tendon tear demonstrated. Left pelvic muscular atrophy related to remote left proximal femoral ORIF.  Lower lumbar spondylosis with chronic resulting foraminal  Stenosis.  MRI cervical in December 2014 Spondylosis at C5-6 contributes to mild central and moderate biforaminal stenosis.  Sometimes, urinary urgency since 2012, no change.   She was started on Keppra, 500mg  bid, she feels more alert, is also confirmed by her sister,  no more confusion episodes, no more spells  UPDATE Sep 02 2014: She is now taking Keppra 500mg  bid, no more seizure like event, no side effect, still swimming.  She is driving some, word finding difficulty, short memory trouble, she lives with her sister,   REVIEW OF SYSTEMS: Full 14 system review of systems performed and notable only for mild gait difficulty, knee pain   ALLERGIES: Allergies  Allergen Reactions  . Celecoxib Other (See Comments)    TACHYCARDIA  . Codeine Other (See Comments)  TACHYCARIDIA  . Hydrocodone Other (See Comments)    TACHYCARDIA  . Tartrazine Other (See Comments)    Causes Tachycardia  . Aspirin     ONLY TARTRAZINE!!! Causes Tachycardia    HOME MEDICATIONS: Outpatient Prescriptions Prior to  Visit  Medication Sig Dispense Refill  . acetaminophen (TYLENOL) 325 MG tablet Take 325 mg by mouth every 6 (six) hours as needed for pain.    Marland Kitchen ADVAIR DISKUS 250-50 MCG/DOSE AEPB Inhale 1 puff into the lungs every morning.    Marland Kitchen albuterol (PROAIR HFA) 108 (90 BASE) MCG/ACT inhaler Inhale 2 puffs into the lungs every 6 (six) hours as needed. FOR SHORTNESS OF BREATH    . ALPRAZolam (XANAX) 0.5 MG tablet Take 0.25-0.5 mg by mouth daily as needed. TAKE HALF TO ONE  A TABLET IF NEEDED FOR ANXIETY    . amLODipine (NORVASC) 10 MG tablet Take 10 mg by mouth every morning.     Marland Kitchen CALCIUM-MAGNESUIUM-ZINC 333-133-8.3 MG TABS Take 1 tablet by mouth every morning.    . cetirizine (ZYRTEC) 10 MG tablet Take 5 mg by mouth 2 (two) times daily.     . diclofenac-misoprostol (ARTHROTEC 75) 75-0.2 MG per tablet Take 1 tablet by mouth 2 (two) times daily.      . digoxin (LANOXIN) 0.125 MG tablet Take 1 tablet (0.125 mg total) by mouth daily. 90 tablet 3  . docusate sodium (COLACE) 100 MG capsule Take 100 mg by mouth 2 (two) times daily.    Marland Kitchen EFFIENT 5 MG TABS tablet TAKE ONE TABLET BY MOUTH ONE TIME DAILY 30 tablet 10  . esomeprazole (NEXIUM) 40 MG capsule Take 1 capsule (40 mg total) by mouth 2 (two) times daily before a meal. 60 capsule 4  . esomeprazole (NEXIUM) 40 MG capsule TAKE 1 CAPSULE (40 MG TOTAL)  BY MOUTH DAILY BEFORE BREAKFAST. 30 capsule 5  . fluticasone (FLONASE) 50 MCG/ACT nasal spray     . fluticasone (FLOVENT HFA) 44 MCG/ACT inhaler Inhale 1 puff into the lungs 2 (two) times daily.      Marland Kitchen levETIRAcetam (KEPPRA) 500 MG tablet Take 1 tablet (500 mg total) by mouth 2 (two) times daily. 60 tablet 12  . levothyroxine (SYNTHROID, LEVOTHROID) 100 MCG tablet Take 100 mcg by mouth daily.    Marland Kitchen losartan (COZAAR) 25 MG tablet Take 25 mg by mouth daily.     . metoprolol succinate (TOPROL-XL) 100 MG 24 hr tablet Take 100 mg by mouth 2 (two) times daily. Take with or immediately following a meal.    . mometasone  (NASONEX) 50 MCG/ACT nasal spray Place 2 sprays into the nose daily.    . nitroGLYCERIN (NITROSTAT) 0.4 MG SL tablet Place 0.4 mg under the tongue every 5 (five) minutes as needed.      . polyethylene glycol (MIRALAX / GLYCOLAX) packet Take 17 g by mouth daily as needed.    . Polyvinyl Alcohol-Povidone (MURINE TEARS FOR DRY EYES) 5-6 MG/ML SOLN Place 1 drop into both eyes every morning. As needed    . traMADol (ULTRAM) 50 MG tablet Take 1 tablet (50 mg total) by mouth every 6 (six) hours as needed. 20 tablet 0   No facility-administered medications prior to visit.    PAST MEDICAL HISTORY: Past Medical History  Diagnosis Date  . Ejection fraction     EF 65 %... normal... catheterization 02/2010.  . Mitral regurgitation     mild, echo, June, 2007  . Paroxysmal supraventricular tachycardia     infrequent episodes  over the years..palpitations.  . Hypertension   . Asthmatic bronchitis   . Obesity   . CAD (coronary artery disease)     bare metal stents to 2 separate RCA lesions August, 2011, residual 60% LAD, medical therapy  /  plan was for effient for one month followed by Plavix,, but patient does not tolerate Plavix... therefore patient placed back on effient  . GERD (gastroesophageal reflux disease)   . Edema 03/29/2010    September, 2011  . Intolerance of drug     aspirin and plavix  . Hypothyroidism 04/24/2011  . Hyperglycemia 04/24/2011  . Fracture of rib of left side 04/24/2011  . Hip fracture     October, 2012  . Diverticulitis     remote hx  . Diabetes mellitus     does not have  . Carotid artery disease     PAST SURGICAL HISTORY: Past Surgical History  Procedure Laterality Date  . Knee arthroscopy  11/2001    left knee  . Laparoscopic cholecystectomy  04/29/1996    [facility] hospital  . Tubal ligation  09/1970  . [place]  [place]  . [place] thumb[place]  [place]  . [place] abdominal [place]rectomy  1983    [place]  . Cardiac catheterization  02/22/2010     2 stents, [place]  . Knee arthroscopy  02/19/2011    Procedure: ARTHROSCOPY KNEE;  Surgeon: Sanjuana Kava;  Location: [facility];  Service: Orthopedics;  Laterality: Right;  Medial and Lateral Partial Menisectomy  . Arthroplasty w/ arthroscopy medial / lateral compartment knee      [facility], should read arthroscopy not arthroplasty  . Orif hip fracture  04/25/2011    Procedure: OPEN REDUCTION INTERNAL FIXATION HIP;  Surgeon: Sanjuana Kava;  Location: [facility];  Service: Orthopedics;  Laterality: Left;  . Ileocolonscopy  12/06/11    fields:diverticula throughtout the colon/inertnal hemorrhoids/  . Esophagogastroduodenoscopy  12/06/11    fields: Negative H. pylori, hiatal hernia/moderate gastritis    FAMILY HISTORY: Family History  Problem Relation Age of Onset  . Diabetes Mother   . Arthritis Mother   . Heart attack Father   . Stroke Father   . Emphysema Father   . Cataracts Father   . Cancer Sister   . Heart disease Sister   . Arthritis Sister   . Arthritis Brother   . Allergies Brother   . Colon cancer Neg Hx     SOCIAL HISTORY:  History   Social History  . Marital Status: Widowed    Spouse Name: N/A  . Number of Children: 6  . Years of Education: college   Occupational History  . [employer]     Education officer, museum   Social History Main Topics  . Smoking status: Never Smoker   . Smokeless tobacco: Never Used  . Alcohol Use: 0.6 oz/week    1 Glasses of wine per week     Comment: rarely  . Drug Use: No  . Sexual Activity: No   Other Topics Concern  . 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   Right handed.   Caffeine. None   Patient lives at home with her sister Bartolo Darter)            PHYSICAL EXAM   Filed Vitals:   09/02/14 1113  BP: 171/79  Pulse: 65  Height: 5\' 1"  (1.549 m)  Weight:  155 lb (70.308 kg)    Not recorded      Body mass index is 29.3 kg/(m^2).   Generalized: In no acute  distress  Neck: Supple, no carotid bruits   Cardiac: Regular rate rhythm  Pulmonary: Clear to auscultation bilaterally  Musculoskeletal: No deformity  Neurological examination  Mentation: Alert oriented to time, place, history taking, and causual conversation,  Mini-Mental Status Examination is  27 out of 30, she has difficulty with 1 out of 3 recalls, is not oriented to year,date, animal naming 9  Cranial nerve II-XII: Pupils were equal round reactive to light extraocular movements were full, Visual field were full on confrontational test. Bilateral fundi were sharp.  Facial sensation and strength were normal. Hearing was intact to finger rubbing bilaterally. Uvula tongue midline.  head turning and shoulder shrug and were normal and symmetric.Tongue protrusion into cheek strength was normal.  Motor: normal tone, bulk and strength.  Sensory: Intact to fine touch, pinprick, preserved vibratory sensation, and proprioception at toes.  Coordination: Normal finger to nose, heel-to-shin bilaterally there was no truncal ataxia  Gait: Rising up from seated position by pushing on chair arm, cautious, mildly atalgic   Romberg signs: Negative  Deep tendon reflexes: Brachioradialis 2/2, biceps 2/2, triceps 2/2, patellar 2/2, Achilles 2/2, plantar responses were flexor bilaterally.   DIAGNOSTIC DATA (LABS, IMAGING, TESTING) - I reviewed patient records, labs, notes, testing and imaging myself where available.  Lab Results  Component Value Date   WBC 9.0 07/11/2013   HGB 13.0 07/11/2013   HCT 39.9 07/11/2013   MCV 90.7 07/11/2013   PLT 251 07/11/2013      Component Value Date/Time   NA 137 07/11/2013 1346   K 4.1 07/11/2013 1346   CL 99 07/11/2013 1346   CO2 29 07/11/2013 1346   GLUCOSE 106* 07/11/2013 1346   BUN 14 07/11/2013 1346   CREATININE 0.96 07/11/2013 1346   CALCIUM 9.2 07/11/2013 1346   PROT 7.6 06/10/2013 1353   ALBUMIN 3.6 06/10/2013 1353   AST 15 06/10/2013 1353    ALT 14 06/10/2013 1353   ALKPHOS 96 06/10/2013 1353   BILITOT 0.2* 06/10/2013 1353   GFRNONAA 55* 07/11/2013 1346   GFRAA 64* 07/11/2013 1346   Lab Results  Component Value Date   CHOL  02/23/2010    132        ATP III CLASSIFICATION:  <200     mg/dL   Desirable  200-239  mg/dL   Borderline High  >=240    mg/dL   High          HDL 33* 02/23/2010   LDLCALC  02/23/2010    62        Total Cholesterol/HDL:CHD Risk Coronary Heart Disease Risk Table                     Men   Women  1/2 Average Risk   3.4   3.3  Average Risk       5.0   4.4  2 X Average Risk   9.6   7.1  3 X Average Risk  23.4   11.0        Use the calculated Patient Ratio above and the CHD Risk Table to determine the patient's CHD Risk.        ATP III CLASSIFICATION (LDL):  <100     mg/dL   Optimal  100-129  mg/dL   Near or Above  Optimal  130-159  mg/dL   Borderline  160-189  mg/dL   High  >190     mg/dL   Very High   TRIG 184* 02/23/2010   CHOLHDL 4.0 02/23/2010   Lab Results  Component Value Date   HGBA1C 6.6* 04/25/2011   No results found for: VITAMINB12 Lab Results  Component Value Date   TSH 2.445 04/24/2011      ASSESSMENT AND PLAN   81 years old Caucasian female, presenting with acute confusion, in the setting of mild memory trouble, acute sinusitis, antibiotic treatment, MRI of the brain has demonstrates diffuse atrophy, moderate small vessel disease,   also one tiny acute stroke involving corpus callosum, which would not explain her current symptoms, her confusion episode has much improved with Keppra 500 twice a day, EEG was normal,     Above episode is most suggestive of complex partial seizure, she is doing well there was no recurrent episode while taking Keppra 500 mg twice a day, continue current medications, return to clinic in one year      Marcial Pacas, M.D. Ph.D.  [facility] [address], [place],  [address] [phone]

## 2014-09-06 ENCOUNTER — Encounter: Payer: Self-pay | Admitting: *Deleted

## 2014-10-21 ENCOUNTER — Encounter: Payer: Self-pay | Admitting: Cardiology

## 2014-12-14 ENCOUNTER — Other Ambulatory Visit: Payer: Self-pay | Admitting: *Deleted

## 2014-12-14 MED ORDER — PRASUGREL HCL 5 MG PO TABS: 5.0000 mg | ORAL_TABLET | Freq: Every day | ORAL | Status: DC

## 2015-01-03 ENCOUNTER — Encounter (HOSPITAL_COMMUNITY): Payer: Self-pay | Admitting: Emergency Medicine

## 2015-01-03 ENCOUNTER — Emergency Department (HOSPITAL_COMMUNITY)
Admission: EM | Admit: 2015-01-03 | Discharge: 2015-01-03 | Disposition: A | Discharge status: Home / Self Care | Payer: Medicare PPO | Attending: Emergency Medicine | Admitting: Emergency Medicine

## 2015-01-03 ENCOUNTER — Emergency Department (HOSPITAL_COMMUNITY): Payer: Medicare PPO

## 2015-01-03 DIAGNOSIS — E039 Hypothyroidism, unspecified: Secondary | ICD-10-CM | POA: Diagnosis not present

## 2015-01-03 DIAGNOSIS — I2510 Atherosclerotic heart disease of native coronary artery without angina pectoris: Secondary | ICD-10-CM | POA: Insufficient documentation

## 2015-01-03 DIAGNOSIS — Z79899 Other long term (current) drug therapy: Secondary | ICD-10-CM | POA: Insufficient documentation

## 2015-01-03 DIAGNOSIS — W19XXXA Unspecified fall, initial encounter: Secondary | ICD-10-CM

## 2015-01-03 DIAGNOSIS — Z7951 Long term (current) use of inhaled steroids: Secondary | ICD-10-CM | POA: Diagnosis not present

## 2015-01-03 DIAGNOSIS — W172XXA Fall into hole, initial encounter: Secondary | ICD-10-CM | POA: Insufficient documentation

## 2015-01-03 DIAGNOSIS — Y92096 Garden or yard of other non-institutional residence as the place of occurrence of the external cause: Secondary | ICD-10-CM | POA: Insufficient documentation

## 2015-01-03 DIAGNOSIS — E119 Type 2 diabetes mellitus without complications: Secondary | ICD-10-CM | POA: Diagnosis not present

## 2015-01-03 DIAGNOSIS — S93401A Sprain of unspecified ligament of right ankle, initial encounter: Secondary | ICD-10-CM | POA: Diagnosis not present

## 2015-01-03 DIAGNOSIS — Y9301 Activity, walking, marching and hiking: Secondary | ICD-10-CM | POA: Insufficient documentation

## 2015-01-03 DIAGNOSIS — E669 Obesity, unspecified: Secondary | ICD-10-CM | POA: Insufficient documentation

## 2015-01-03 DIAGNOSIS — K219 Gastro-esophageal reflux disease without esophagitis: Secondary | ICD-10-CM | POA: Insufficient documentation

## 2015-01-03 DIAGNOSIS — I10 Essential (primary) hypertension: Secondary | ICD-10-CM | POA: Diagnosis not present

## 2015-01-03 DIAGNOSIS — Z9889 Other specified postprocedural states: Secondary | ICD-10-CM | POA: Diagnosis not present

## 2015-01-03 DIAGNOSIS — Z791 Long term (current) use of non-steroidal anti-inflammatories (NSAID): Secondary | ICD-10-CM | POA: Diagnosis not present

## 2015-01-03 DIAGNOSIS — Y998 Other external cause status: Secondary | ICD-10-CM | POA: Diagnosis not present

## 2015-01-03 DIAGNOSIS — S20212A Contusion of left front wall of thorax, initial encounter: Secondary | ICD-10-CM

## 2015-01-03 DIAGNOSIS — S99911A Unspecified injury of right ankle, initial encounter: Secondary | ICD-10-CM | POA: Diagnosis present

## 2015-01-03 DIAGNOSIS — J45909 Unspecified asthma, uncomplicated: Secondary | ICD-10-CM | POA: Diagnosis not present

## 2015-01-03 MED ORDER — OXYCODONE-ACETAMINOPHEN 5-325 MG PO TABS: 1.0000 | ORAL_TABLET | ORAL | Status: DC | PRN

## 2015-01-03 NOTE — ED Provider Notes (Signed)
CSN: [id number]     Arrival date & time 01/03/15  1852 History   First MD Initiated Contact with Patient 01/03/15 1909     Chief Complaint  Patient presents with  . Fall  . Ankle Pain  . Rib Injury     (Consider location/radiation/quality/duration/timing/severity/associated sxs/prior Treatment) HPI Comments: Patient is an 81 year old female with history of diabetes, SVT, coronary artery disease. She presents for evaluation of right ankle and left chest wall pain that started after a fall. She states she was walking through the yard when she stepped in a hole, turned her ankle, then fell on her right side. She denies any shortness of breath but does report discomfort with breathing to the left ribs.  Patient is a 81 y.o. female presenting with fall. The history is provided by the patient.  Fall This is a new problem. The current episode started less than 1 hour ago. The problem occurs constantly. The problem has not changed since onset.Pertinent negatives include no chest pain and no shortness of breath. The symptoms are aggravated by walking. Nothing relieves the symptoms. She has tried nothing for the symptoms.    Past Medical History  Diagnosis Date  . Ejection fraction     EF 65 %... normal... catheterization 02/2010.  . Mitral regurgitation     mild, echo, June, 2007  . Paroxysmal supraventricular tachycardia     infrequent episodes over the years..palpitations.  . Hypertension   . Asthmatic bronchitis   . Obesity   . CAD (coronary artery disease)     bare metal stents to 2 separate RCA lesions August, 2011, residual 60% LAD, medical therapy  /  plan was for effient for one month followed by Plavix,, but patient does not tolerate Plavix... therefore patient placed back on effient  . GERD (gastroesophageal reflux disease)   . Edema 03/29/2010    September, 2011  . Intolerance of drug     aspirin and plavix  . Hypothyroidism 04/24/2011  . Hyperglycemia 04/24/2011  . Fracture of  rib of left side 04/24/2011  . Hip fracture     October, 2012  . Diverticulitis     remote hx  . Diabetes mellitus     does not have  . Carotid artery disease    Past Surgical History  Procedure Laterality Date  . Knee arthroscopy  11/2001    left knee  . Laparoscopic cholecystectomy  04/29/1996    [facility]  . Tubal ligation  09/1970  . [place] fracture [place]  [place]    [place]  . Sp arthro thumb*r*  05/1989    [place]  . [place] abdominal hysterectomy  1983    [place]  . Cardiac catheterization  02/22/2010    2 stents, [place]  . Knee arthroscopy  02/19/2011    Procedure: ARTHROSCOPY KNEE;  Surgeon: Sanjuana Kava;  Location: [facility];  Service: Orthopedics;  Laterality: Right;  Medial and Lateral Partial Menisectomy  . Arthroplasty w/ arthroscopy medial / lateral compartment knee      [facility], should read arthroscopy not arthroplasty  . Orif hip fracture  04/25/2011    Procedure: OPEN REDUCTION INTERNAL FIXATION HIP;  Surgeon: Sanjuana Kava;  Location: [facility];  Service: Orthopedics;  Laterality: Left;  . Ileocolonscopy  12/06/11    fields:diverticula throughtout the colon/inertnal hemorrhoids/  . Esophagogastroduodenoscopy  12/06/11    fields: Negative H. pylori, hiatal hernia/moderate gastritis   Family History  Problem Relation Age of Onset  . Diabetes Mother   . Arthritis  Mother   . Heart attack Father   . Stroke Father   . Emphysema Father   . Cataracts Father   . Cancer Sister   . Heart disease Sister   . Arthritis Sister   . Arthritis Brother   . Allergies Brother   . Colon cancer Neg Hx    History  Substance Use Topics  . Smoking status: Never Smoker   . Smokeless tobacco: Never Used  . Alcohol Use: No     Comment: rarely   OB History    Gravida Para Term Preterm AB TAB SAB Ectopic Multiple Living   6 6 6             Review of Systems  Respiratory: Negative for shortness of breath.   Cardiovascular: Negative for chest pain.  All other systems reviewed and are  negative.     Allergies  Celecoxib; Codeine; Hydrocodone; Tartrazine; and Aspirin  Home Medications   Prior to Admission medications   Medication Sig Start Date End Date Taking? Authorizing Provider  acetaminophen (TYLENOL) 325 MG tablet Take 325 mg by mouth every 6 (six) hours as needed for pain.    Historical Provider, MD  ADVAIR DISKUS 250-50 MCG/DOSE AEPB Inhale 1 puff into the lungs every morning. 04/06/13   Historical Provider, MD  albuterol (PROAIR HFA) 108 (90 BASE) MCG/ACT inhaler Inhale 2 puffs into the lungs every 6 (six) hours as needed. FOR SHORTNESS OF BREATH    Historical Provider, MD  ALPRAZolam Duanne Moron) 0.5 MG tablet Take 0.25-0.5 mg by mouth daily as needed. TAKE HALF TO ONE  A TABLET IF NEEDED FOR ANXIETY    Historical Provider, MD  amLODipine (NORVASC) 10 MG tablet Take 10 mg by mouth every morning.     Historical Provider, MD  CALCIUM-MAGNESUIUM-ZINC 333-133-8.3 MG TABS Take 1 tablet by mouth every morning.    Historical Provider, MD  cetirizine (ZYRTEC) 10 MG tablet Take 5 mg by mouth 2 (two) times daily.     Historical Provider, MD  diclofenac-misoprostol (ARTHROTEC 75) 75-0.2 MG per tablet Take 1 tablet by mouth 2 (two) times daily.      Historical Provider, MD  digoxin (LANOXIN) 0.125 MG tablet Take 1 tablet (0.125 mg total) by mouth daily. 01/03/14   Carlena Bjornstad, MD  docusate sodium (COLACE) 100 MG capsule Take 100 mg by mouth 2 (two) times daily.    Historical Provider, MD  esomeprazole (NEXIUM) 40 MG capsule TAKE 1 CAPSULE (40 MG TOTAL)  BY MOUTH DAILY BEFORE BREAKFAST. 01/24/14   Danie Binder, MD  fluticasone (FLONASE) 50 MCG/ACT nasal spray  06/25/13   Historical Provider, MD  fluticasone (FLOVENT HFA) 44 MCG/ACT inhaler Inhale 1 puff into the lungs 2 (two) times daily.      Historical Provider, MD  levETIRAcetam (KEPPRA) 500 MG tablet Take 1 tablet (500 mg total) by mouth 2 (two) times daily. 09/02/14   Marcial Pacas, MD  levothyroxine (SYNTHROID, LEVOTHROID) 100  MCG tablet Take 100 mcg by mouth daily.    Historical Provider, MD  losartan (COZAAR) 25 MG tablet Take 25 mg by mouth daily.  08/26/13   Historical Provider, MD  metoprolol succinate (TOPROL-XL) 100 MG 24 hr tablet Take 100 mg by mouth 2 (two) times daily. Take with or immediately following a meal.    Historical Provider, MD  mometasone (NASONEX) 50 MCG/ACT nasal spray Place 2 sprays into the nose daily.    Historical Provider, MD  nitroGLYCERIN (NITROSTAT) 0.4 MG SL tablet Place  0.4 mg under the tongue every 5 (five) minutes as needed.      Historical Provider, MD  polyethylene glycol (MIRALAX / GLYCOLAX) packet Take 17 g by mouth daily as needed.    Historical Provider, MD  Polyvinyl Alcohol-Povidone (MURINE TEARS FOR DRY EYES) 5-6 MG/ML SOLN Place 1 drop into both eyes every morning. As needed    Historical Provider, MD  prasugrel (EFFIENT) 5 MG TABS tablet Take 1 tablet (5 mg total) by mouth daily. 12/14/14   Carlena Bjornstad, MD  traMADol (ULTRAM) 50 MG tablet Take 1 tablet (50 mg total) by mouth every 6 (six) hours as needed. 07/11/13   Nat Christen, MD   BP 161/65 mmHg  Pulse 72  Temp(Src) 98 F (36.7 C) (Oral)  Resp 18  Wt 151 lb (68.493 kg)  SpO2 99% Physical Exam  Constitutional: She is oriented to person, place, and time. She appears well-developed and well-nourished. No distress.  HENT:  Head: Normocephalic and atraumatic.  Neck: Normal range of motion. Neck supple.  Cardiovascular: Normal rate and regular rhythm.  Exam reveals no gallop and no friction rub.   No murmur heard. Pulmonary/Chest: Effort normal and breath sounds normal. No respiratory distress. She has no wheezes. She exhibits tenderness.  There is tenderness to palpation to the left lateral chest wall, just behind the left breast. There is no palpable abnormality or crepitus.  Abdominal: Soft. Bowel sounds are normal. She exhibits no distension. There is no tenderness.  Musculoskeletal: Normal range of motion.  The  right ankle is noted to have moderate swelling over the lateral malleolus. She has good range of motion in the joint appears stable. There is no proximal fibular or fifth metatarsal tenderness. Distal cap refill and sensation is intact to the entire foot.  Neurological: She is alert and oriented to person, place, and time.  Skin: Skin is warm and dry. She is not diaphoretic.  Nursing note and vitals reviewed.   ED Course  Procedures (including critical care time) Labs Review Labs Reviewed - No data to display  Imaging Review No results found.   EKG Interpretation None      MDM   Final diagnoses:  Fall    Patient is an 81 year old female who presents after a fall complaining of right ankle and left rib pain. She appears clinically well and x-rays reveal no evidence for fracture. She will be wrapped with an Ace bandage, recommend ice, and when necessary return. She is requesting pain medication for her ribs and I will give her hydrocodone which she can take as needed.    Veryl Speak, MD 01/03/15 2013

## 2015-01-03 NOTE — Discharge Instructions (Signed)
Wear Ace bandage as applied. Keep your ankle elevated and iced whenever possible for the next 2 days.  Percocet as prescribed as needed for pain.   Ankle Sprain An ankle sprain is an injury to the strong, fibrous tissues (ligaments) that hold the bones of your ankle joint together.  CAUSES An ankle sprain is usually caused by a fall or by twisting your ankle. Ankle sprains most commonly occur when you step on the outer edge of your foot, and your ankle turns inward. People who participate in sports are more prone to these types of injuries.  SYMPTOMS   Pain in your ankle. The pain may be present at rest or only when you are trying to stand or walk.  Swelling.  Bruising. Bruising may develop immediately or within 1 to 2 days after your injury.  Difficulty standing or walking, particularly when turning corners or changing directions. DIAGNOSIS  Your caregiver will ask you details about your injury and perform a physical exam of your ankle to determine if you have an ankle sprain. During the physical exam, your caregiver will press on and apply pressure to specific areas of your foot and ankle. Your caregiver will try to move your ankle in certain ways. An X-ray exam may be done to be sure a bone was not broken or a ligament did not separate from one of the bones in your ankle (avulsion fracture).  TREATMENT  Certain types of braces can help stabilize your ankle. Your caregiver can make a recommendation for this. Your caregiver may recommend the use of medicine for pain. If your sprain is severe, your caregiver may refer you to a surgeon who helps to restore function to parts of your skeletal system (orthopedist) or a physical therapist. Seibert ice to your injury for 1-2 days or as directed by your caregiver. Applying ice helps to reduce inflammation and pain.  Put ice in a plastic bag.  Place a towel between your skin and the bag.  Leave the ice on for 15-20  minutes at a time, every 2 hours while you are awake.  Only take over-the-counter or prescription medicines for pain, discomfort, or fever as directed by your caregiver.  Elevate your injured ankle above the level of your heart as much as possible for 2-3 days.  If your caregiver recommends crutches, use them as instructed. Gradually put weight on the affected ankle. Continue to use crutches or a cane until you can walk without feeling pain in your ankle.  If you have a plaster splint, wear the splint as directed by your caregiver. Do not rest it on anything harder than a pillow for the first 24 hours. Do not put weight on it. Do not get it wet. You may take it off to take a shower or bath.  You may have been given an elastic bandage to wear around your ankle to provide support. If the elastic bandage is too tight (you have numbness or tingling in your foot or your foot becomes cold and blue), adjust the bandage to make it comfortable.  If you have an air splint, you may blow more air into it or let air out to make it more comfortable. You may take your splint off at night and before taking a shower or bath. Wiggle your toes in the splint several times per day to decrease swelling. SEEK MEDICAL CARE IF:   You have rapidly increasing bruising or swelling.  Your toes feel extremely  cold or you lose feeling in your foot.  Your pain is not relieved with medicine. SEEK IMMEDIATE MEDICAL CARE IF:  Your toes are numb or blue.  You have severe pain that is increasing. MAKE SURE YOU:   Understand these instructions.  Will watch your condition.  Will get help right away if you are not doing well or get worse. Document Released: 07/08/2005 Document Revised: 04/01/2012 Document Reviewed: 07/20/2011 [facility] Patient Information 2015 [place], [place]. This information is not intended to replace advice given to you by your health care provider. Make sure you discuss any questions you have with your  health care provider.  Chest Contusion A chest contusion is a deep bruise on your chest area. Contusions are the result of an injury that caused bleeding under the skin. A chest contusion may involve bruising of the skin, muscles, or ribs. The contusion may turn blue, purple, or yellow. Minor injuries will give you a painless contusion, but more severe contusions may stay painful and swollen for a few weeks. CAUSES  A contusion is usually caused by a blow, trauma, or direct force to an area of the body. SYMPTOMS   Swelling and redness of the injured area.  Discoloration of the injured area.  Tenderness and soreness of the injured area.  Pain. DIAGNOSIS  The diagnosis can be made by taking a history and performing a physical exam. An X-ray, CT scan, or MRI may be needed to determine if there were any associated injuries, such as broken bones (fractures) or internal injuries. TREATMENT  Often, the best treatment for a chest contusion is resting, icing, and applying cold compresses to the injured area. Deep breathing exercises may be recommended to reduce the risk of pneumonia. Over-the-counter medicines may also be recommended for pain control. HOME CARE INSTRUCTIONS   Put ice on the injured area.  Put ice in a plastic bag.  Place a towel between your skin and the bag.  Leave the ice on for 15-20 minutes, 03-04 times a day.  Only take over-the-counter or prescription medicines as directed by your caregiver. Your caregiver may recommend avoiding anti-inflammatory medicines (aspirin, ibuprofen, and naproxen) for 48 hours because these medicines may increase bruising.  Rest the injured area.  Perform deep-breathing exercises as directed by your caregiver.  Stop smoking if you smoke.  Do not lift objects over 5 pounds (2.3 kg) for 3 days or longer if recommended by your caregiver. SEEK IMMEDIATE MEDICAL CARE IF:   You have increased bruising or swelling.  You have pain that is  getting worse.  You have difficulty breathing.  You have dizziness, weakness, or fainting.  You have blood in your urine or stool.  You cough up or vomit blood.  Your swelling or pain is not relieved with medicines. MAKE SURE YOU:   Understand these instructions.  Will watch your condition.  Will get help right away if you are not doing well or get worse. Document Released: 04/02/2001 Document Revised: 04/01/2012 Document Reviewed: 12/30/2011 [facility] Patient Information 2015 [place], [place]. This information is not intended to replace advice given to you by your health care provider. Make sure you discuss any questions you have with your health care provider.

## 2015-01-03 NOTE — ED Notes (Signed)
Patient states she was walking outside and stepped in a hole, twisting her right ankle and falling on her left side today. Complaining of left rib pain and right ankle pain.

## 2015-01-05 ENCOUNTER — Ambulatory Visit (INDEPENDENT_AMBULATORY_CARE_PROVIDER_SITE_OTHER): Payer: Medicare PPO | Admitting: Cardiology

## 2015-01-05 ENCOUNTER — Encounter: Payer: Self-pay | Admitting: Cardiology

## 2015-01-05 VITALS — BP 110/70 | HR 63 | Ht 61.0 in | Wt 150.0 lb

## 2015-01-05 DIAGNOSIS — I471 Supraventricular tachycardia: Secondary | ICD-10-CM

## 2015-01-05 DIAGNOSIS — I2510 Atherosclerotic heart disease of native coronary artery without angina pectoris: Secondary | ICD-10-CM | POA: Diagnosis not present

## 2015-01-05 DIAGNOSIS — I10 Essential (primary) hypertension: Secondary | ICD-10-CM

## 2015-01-05 NOTE — Assessment & Plan Note (Signed)
Coronary status is stable. She had an intervention in 2011. She has an allergy to aspirin. She cannot tolerate Plavix. Therefore she remains on long-term prasugrel.

## 2015-01-05 NOTE — Patient Instructions (Addendum)
Your physician has recommended you make the following change in your medication:  Stop digoxin. Continue all other medications the same. Your physician recommends that you schedule a follow-up appointment in: 1 year. You will receive a reminder letter in the mail in about 10 months reminding you to call and schedule your appointment. If you don't receive this letter, please contact our office.

## 2015-01-05 NOTE — Progress Notes (Signed)
Cardiology Office Note   Date:  01/05/2015   ID:  Katie Ford, Katie Ford June 05, 1932, MRN [id number]  PCP:  Katie Mustache, MD  Cardiologist:  Katie Argyle, MD   Chief Complaint  Patient presents with  . Appointment    Follow-up coronary disease      History of Present Illness: Katie Ford is a 81 y.o. female who presents today to follow-up coronary artery disease. I saw her last June, 2015. Her cardiac status is been stable. She's been followed by neurology. She's had some confusion and complex seizures. She is on medications for this. She is not having any significant chest pain. She stepped in a hole in her yard recently and fell. Fortunately she did not have any major injuries.    Past Medical History  Diagnosis Date  . Ejection fraction     EF 65 %... normal... catheterization 02/2010.  . Mitral regurgitation     mild, echo, June, 2007  . Paroxysmal supraventricular tachycardia     infrequent episodes over the years..palpitations.  . Hypertension   . Asthmatic bronchitis   . Obesity   . CAD (coronary artery disease)     bare metal stents to 2 separate RCA lesions August, 2011, residual 60% LAD, medical therapy  /  plan was for effient for one month followed by Plavix,, but patient does not tolerate Plavix... therefore patient placed back on effient  . GERD (gastroesophageal reflux disease)   . Edema 03/29/2010    September, 2011  . Intolerance of drug     aspirin and plavix  . Hypothyroidism 04/24/2011  . Hyperglycemia 04/24/2011  . Fracture of rib of left side 04/24/2011  . Hip fracture     October, 2012  . Diverticulitis     remote hx  . Diabetes mellitus     does not have  . Carotid artery disease     Past Surgical History  Procedure Laterality Date  . Knee arthroscopy  11/2001    left knee  . Laparoscopic cholecystectomy  04/29/1996    [facility]  . Tubal ligation  09/1970  . Ankle fracture surgery  12/1993    [place]  . Sp arthro thumb*r*   05/1989    [place]  . Total abdominal hysterectomy  1983    [place]  . Cardiac catheterization  02/22/2010    2 stents, [place]  . Knee arthroscopy  02/19/2011    Procedure: ARTHROSCOPY KNEE;  Surgeon: Katie Ford;  Location: [facility];  Service: Orthopedics;  Laterality: Right;  Medial and Lateral Partial Menisectomy  . Arthroplasty w/ arthroscopy medial / lateral compartment knee      [facility], should read arthroscopy not arthroplasty  . Orif hip fracture  04/25/2011    Procedure: OPEN REDUCTION INTERNAL FIXATION HIP;  Surgeon: Katie Ford;  Location: [facility];  Service: Orthopedics;  Laterality: Left;  . Ileocolonscopy  12/06/11    fields:diverticula throughtout the colon/inertnal hemorrhoids/  . Esophagogastroduodenoscopy  12/06/11    fields: Negative H. pylori, hiatal hernia/moderate gastritis    Patient Active Problem List   Diagnosis Date Noted  . Ejection fraction     Priority: High  . Intolerance of drug     Priority: High  . Mitral regurgitation     Priority: High  . Paroxysmal supraventricular tachycardia     Priority: High  . Hypertension     Priority: High  . CAD (coronary artery disease)     Priority: High  . Complex partial seizure 09/02/2013  .  Subacute confusional state 07/30/2013  . Carotid artery disease   . Diverticulosis 03/06/2012  . Abnormal CT of the abdomen 11/25/2011  . Abdominal pain, unspecified site 11/25/2011  . Hip fracture   . UTI (urinary tract infection) 04/25/2011  . Leukocytosis 04/24/2011  . Hyponatremia 04/24/2011  . Fracture of left hip 04/24/2011  . Asthmatic bronchitis 04/24/2011  . Anemia 04/24/2011  . Hypothyroidism 04/24/2011  . DM2 (diabetes mellitus, type 2) 04/24/2011  . Fracture of rib of left side 04/24/2011  . Asthmatic bronchitis   . Obesity   . GERD (gastroesophageal reflux disease)   . Edema 03/29/2010      Current Outpatient Prescriptions  Medication Sig Dispense Refill  . acetaminophen (TYLENOL) 325 MG tablet Take 325 mg by  mouth every 6 (six) hours as needed for pain.    Marland Kitchen ADVAIR DISKUS 250-50 MCG/DOSE AEPB Inhale 1 puff into the lungs every morning.    Marland Kitchen albuterol (PROAIR HFA) 108 (90 BASE) MCG/ACT inhaler Inhale 2 puffs into the lungs every 6 (six) hours as needed. FOR SHORTNESS OF BREATH    . ALPRAZolam (XANAX) 0.5 MG tablet Take 0.25-0.5 mg by mouth daily as needed. TAKE HALF TO ONE  A TABLET IF NEEDED FOR ANXIETY    . amLODipine (NORVASC) 10 MG tablet Take 10 mg by mouth every morning.     Marland Kitchen CALCIUM-MAGNESUIUM-ZINC 333-133-8.3 MG TABS Take 1 tablet by mouth every morning.    . cetirizine (ZYRTEC) 10 MG tablet Take 5 mg by mouth 2 (two) times daily.     . diclofenac-misoprostol (ARTHROTEC 75) 75-0.2 MG per tablet Take 1 tablet by mouth 2 (two) times daily.      . digoxin (LANOXIN) 0.125 MG tablet Take 1 tablet (0.125 mg total) by mouth daily. 90 tablet 3  . docusate sodium (COLACE) 100 MG capsule Take 100 mg by mouth 2 (two) times daily.    Marland Kitchen esomeprazole (NEXIUM) 40 MG capsule TAKE 1 CAPSULE (40 MG TOTAL)  BY MOUTH DAILY BEFORE BREAKFAST. 30 capsule 5  . fluticasone (FLONASE) 50 MCG/ACT nasal spray     . fluticasone (FLOVENT HFA) 44 MCG/ACT inhaler Inhale 1 puff into the lungs 2 (two) times daily.      Marland Kitchen levETIRAcetam (KEPPRA) 500 MG tablet Take 1 tablet (500 mg total) by mouth 2 (two) times daily. 60 tablet 12  . levothyroxine (SYNTHROID, LEVOTHROID) 100 MCG tablet Take 100 mcg by mouth daily.    Marland Kitchen losartan (COZAAR) 25 MG tablet Take 25 mg by mouth daily.     . metoprolol succinate (TOPROL-XL) 100 MG 24 hr tablet Take 100 mg by mouth 2 (two) times daily. Take with or immediately following a meal.    . mometasone (NASONEX) 50 MCG/ACT nasal spray Place 2 sprays into the nose daily.    . nitroGLYCERIN (NITROSTAT) 0.4 MG SL tablet Place 0.4 mg under the tongue every 5 (five) minutes as needed.      Marland Kitchen oxyCODONE-acetaminophen (PERCOCET) 5-325 MG per tablet Take 1 tablet by mouth every 4 (four) hours as needed. 15  tablet 0  . polyethylene glycol (MIRALAX / GLYCOLAX) packet Take 17 g by mouth daily as needed.    . Polyvinyl Alcohol-Povidone (MURINE TEARS FOR DRY EYES) 5-6 MG/ML SOLN Place 1 drop into both eyes every morning. As needed    . prasugrel (EFFIENT) 5 MG TABS tablet Take 1 tablet (5 mg total) by mouth daily. 30 tablet 2  . traMADol (ULTRAM) 50 MG tablet Take 1 tablet (50  mg total) by mouth every 6 (six) hours as needed. 20 tablet 0   No current facility-administered medications for this visit.    Allergies:   Celecoxib; Codeine; Hydrocodone; Tartrazine; and Aspirin    Social History:  The patient  reports that she has never smoked. She has never used smokeless tobacco. She reports that she does not drink alcohol or use illicit drugs.   Family History:  The patient's family history includes Allergies in her brother; Arthritis in her brother, mother, and sister; Cancer in her sister; Cataracts in her father; Diabetes in her mother; Emphysema in her father; Heart attack in her father; Heart disease in her sister; Stroke in her father. There is no history of Colon cancer.    ROS:  Please see the history of present illness.   Patient denies fever, chills, headache, sweats, rash, change in vision, change in hearing, chest pain, cough, nausea or vomiting, urinary symptoms. She does have discomfort in her left arm and chest where she fell in her yard. All other systems are reviewed and are negative.   PHYSICAL EXAM: VS:  BP 110/70 mmHg  Pulse 63  Ht 5\' 1"  (1.549 m)  Wt 150 lb (68.04 kg)  BMI 28.36 kg/m2  SpO2 97% , Patient is very talkative today. She is here with her helper. She is oriented to person time and place. Head is atraumatic. Sclerae and conjunctiva are normal. There is no jugular venous distention. Lungs are clear. Respiratory effort is nonlabored. Cardiac exam reveals S1 and S2. The abdomen is soft and there is no peripheral edema. There are no musculoskeletal deformities. There are no  skin rashes. She has soreness to motion of the left arm.  EKG:   EKG is done today and reviewed by me. There is normal sinus rhythm. There are mild nonspecific ST-T wave changes.   Recent Labs: No results found for requested labs within last 365 days.    Lipid Panel    Component Value Date/Time   CHOL  02/23/2010 0524    132        ATP III CLASSIFICATION:  <200     mg/dL   Desirable  200-239  mg/dL   Borderline High  >=240    mg/dL   High          TRIG 184* 02/23/2010 0524   HDL 33* 02/23/2010 0524   CHOLHDL 4.0 02/23/2010 0524   VLDL 37 02/23/2010 0524   LDLCALC  02/23/2010 0524    62        Total Cholesterol/HDL:CHD Risk Coronary Heart Disease Risk Table                     Men   Women  1/2 Average Risk   3.4   3.3  Average Risk       5.0   4.4  2 X Average Risk   9.6   7.1  3 X Average Risk  23.4   11.0        Use the calculated Patient Ratio above and the CHD Risk Table to determine the patient's CHD Risk.        ATP III CLASSIFICATION (LDL):  <100     mg/dL   Optimal  100-129  mg/dL   Near or Above                    Optimal  130-159  mg/dL   Borderline  160-189  mg/dL   High  >  190     mg/dL   Very High      Wt Readings from Last 3 Encounters:  01/05/15 150 lb (68.04 kg)  01/03/15 151 lb (68.493 kg)  09/02/14 155 lb (70.308 kg)      Current medicines are reviewed       ASSESSMENT AND PLAN:

## 2015-01-05 NOTE — Assessment & Plan Note (Signed)
In the remote past she had some supraventricular tachycardia. Historically she has been treated with digoxin. We will stop her digoxin today.

## 2015-01-05 NOTE — Assessment & Plan Note (Signed)
Blood pressures controlled. No change in therapy. 

## 2015-03-09 ENCOUNTER — Other Ambulatory Visit: Payer: Self-pay | Admitting: *Deleted

## 2015-03-09 MED ORDER — PRASUGREL HCL 5 MG PO TABS: 5.0000 mg | ORAL_TABLET | Freq: Every day | ORAL | Status: AC

## 2015-03-09 NOTE — Telephone Encounter (Signed)
Effient refilled today.

## 2015-03-19 ENCOUNTER — Emergency Department (HOSPITAL_COMMUNITY): Payer: Medicare PPO

## 2015-03-19 ENCOUNTER — Encounter (HOSPITAL_COMMUNITY): Payer: Self-pay | Admitting: Emergency Medicine

## 2015-03-19 ENCOUNTER — Emergency Department (HOSPITAL_COMMUNITY)
Admission: EM | Admit: 2015-03-19 | Discharge: 2015-03-19 | Disposition: A | Discharge status: Home / Self Care | Payer: Medicare PPO | Attending: Physician Assistant | Admitting: Physician Assistant

## 2015-03-19 DIAGNOSIS — Z9889 Other specified postprocedural states: Secondary | ICD-10-CM | POA: Insufficient documentation

## 2015-03-19 DIAGNOSIS — Z79899 Other long term (current) drug therapy: Secondary | ICD-10-CM | POA: Insufficient documentation

## 2015-03-19 DIAGNOSIS — K219 Gastro-esophageal reflux disease without esophagitis: Secondary | ICD-10-CM | POA: Diagnosis not present

## 2015-03-19 DIAGNOSIS — J45909 Unspecified asthma, uncomplicated: Secondary | ICD-10-CM | POA: Insufficient documentation

## 2015-03-19 DIAGNOSIS — Z7951 Long term (current) use of inhaled steroids: Secondary | ICD-10-CM | POA: Diagnosis not present

## 2015-03-19 DIAGNOSIS — I10 Essential (primary) hypertension: Secondary | ICD-10-CM | POA: Diagnosis not present

## 2015-03-19 DIAGNOSIS — Z791 Long term (current) use of non-steroidal anti-inflammatories (NSAID): Secondary | ICD-10-CM | POA: Insufficient documentation

## 2015-03-19 DIAGNOSIS — Z8781 Personal history of (healed) traumatic fracture: Secondary | ICD-10-CM | POA: Diagnosis not present

## 2015-03-19 DIAGNOSIS — I2510 Atherosclerotic heart disease of native coronary artery without angina pectoris: Secondary | ICD-10-CM | POA: Insufficient documentation

## 2015-03-19 DIAGNOSIS — M7989 Other specified soft tissue disorders: Secondary | ICD-10-CM | POA: Diagnosis present

## 2015-03-19 DIAGNOSIS — E039 Hypothyroidism, unspecified: Secondary | ICD-10-CM | POA: Insufficient documentation

## 2015-03-19 LAB — COMPREHENSIVE METABOLIC PANEL
ALBUMIN: 3.3 g/dL — AB (ref 3.5–5.0)
ALK PHOS: 94 U/L (ref 38–126)
ALT: 13 U/L — ABNORMAL LOW (ref 14–54)
ANION GAP: 4 — AB (ref 5–15)
AST: 16 U/L (ref 15–41)
BILIRUBIN TOTAL: 0.2 mg/dL — AB (ref 0.3–1.2)
BUN: 34 mg/dL — ABNORMAL HIGH (ref 6–20)
CALCIUM: 8.6 mg/dL — AB (ref 8.9–10.3)
CO2: 29 mmol/L (ref 22–32)
Chloride: 105 mmol/L (ref 101–111)
Creatinine, Ser: 1.28 mg/dL — ABNORMAL HIGH (ref 0.44–1.00)
GFR calc Af Amer: 44 mL/min — ABNORMAL LOW (ref >60)
GFR calc non Af Amer: 38 mL/min — ABNORMAL LOW (ref >60)
GLUCOSE: 113 mg/dL — AB (ref 65–99)
Potassium: 4.5 mmol/L (ref 3.5–5.1)
Sodium: 138 mmol/L (ref 135–145)
TOTAL PROTEIN: 6.6 g/dL (ref 6.5–8.1)

## 2015-03-19 LAB — CBC WITH DIFFERENTIAL/PLATELET
BASOS ABS: 0.1 10*3/uL (ref 0.0–0.1)
BASOS PCT: 1 % (ref 0–1)
EOS PCT: 13 % — AB (ref 0–5)
Eosinophils Absolute: 1.2 10*3/uL — ABNORMAL HIGH (ref 0.0–0.7)
HEMATOCRIT: 36.1 % (ref 36.0–46.0)
Hemoglobin: 11.8 g/dL — ABNORMAL LOW (ref 12.0–15.0)
Lymphocytes Relative: 22 % (ref 12–46)
Lymphs Abs: 2 10*3/uL (ref 0.7–4.0)
MCH: 29.9 pg (ref 26.0–34.0)
MCHC: 32.7 g/dL (ref 30.0–36.0)
MCV: 91.4 fL (ref 78.0–100.0)
MONO ABS: 1.2 10*3/uL — AB (ref 0.1–1.0)
Monocytes Relative: 13 % — ABNORMAL HIGH (ref 3–12)
NEUTROS ABS: 4.8 10*3/uL (ref 1.7–7.7)
Neutrophils Relative %: 51 % (ref 43–77)
PLATELETS: 271 10*3/uL (ref 150–400)
RBC: 3.95 MIL/uL (ref 3.87–5.11)
RDW: 13.5 % (ref 11.5–15.5)
WBC: 9.3 10*3/uL (ref 4.0–10.5)

## 2015-03-19 LAB — BRAIN NATRIURETIC PEPTIDE: B NATRIURETIC PEPTIDE 5: 270 pg/mL — AB (ref 0.0–100.0)

## 2015-03-19 MED ORDER — IPRATROPIUM-ALBUTEROL 0.5-2.5 (3) MG/3ML IN SOLN
3.0000 mL | Freq: Once | RESPIRATORY_TRACT | Status: AC
  Administered 2015-03-19: 3 mL via RESPIRATORY_TRACT
  Filled 2015-03-19: qty 3

## 2015-03-19 NOTE — Discharge Instructions (Signed)
You were seen today for leg swelling. We want you0 stop your statin medication. We want you to follow-up tomorrow with your regular physician. If you have any trouble with shortness of breath or increased swelling please return immediately.\ Edema Edema is an abnormal buildup of fluids in your bodytissues. Edema is somewhatdependent on gravity to pull the fluid to the lowest place in your body. That makes the condition more common in the legs and thighs (lower extremities). Painless swelling of the feet and ankles is common and becomes more likely as you get older. It is also common in looser tissues, like around your eyes.  When the affected area is squeezed, the fluid may move out of that spot and leave a dent for a few moments. This dent is called pitting.  CAUSES  There are many possible causes of edema. Eating too much salt and being on your feet or sitting for a long time can cause edema in your legs and ankles. Hot weather may make edema worse. Common medical causes of edema include:  Heart failure.  Liver disease.  Kidney disease.  Weak blood vessels in your legs.  Cancer.  An injury.  Pregnancy.  Some medications.  Obesity. SYMPTOMS  Edema is usually painless.Your skin may look swollen or shiny.  DIAGNOSIS  Your health care provider may be able to diagnose edema by asking about your medical history and doing a physical exam. You may need to have tests such as X-rays, an electrocardiogram, or blood tests to check for medical conditions that may cause edema.  TREATMENT  Edema treatment depends on the cause. If you have heart, liver, or kidney disease, you need the treatment appropriate for these conditions. General treatment may include:  Elevation of the affected body part above the level of your heart.  Compression of the affected body part. Pressure from elastic bandages or support stockings squeezes the tissues and forces fluid back into the blood vessels. This keeps  fluid from entering the tissues.  Restriction of fluid and salt intake.  Use of a water pill (diuretic). These medications are appropriate only for some types of edema. They pull fluid out of your body and make you urinate more often. This gets rid of fluid and reduces swelling, but diuretics can have side effects. Only use diuretics as directed by your health care provider. HOME CARE INSTRUCTIONS   Keep the affected body part above the level of your heart when you are lying down.   Do not sit still or stand for prolonged periods.   Do not put anything directly under your knees when lying down.  Do not wear constricting clothing or garters on your upper legs.   Exercise your legs to work the fluid back into your blood vessels. This may help the swelling go down.   Wear elastic bandages or support stockings to reduce ankle swelling as directed by your health care provider.   Eat a low-salt diet to reduce fluid if your health care provider recommends it.   Only take medicines as directed by your health care provider. SEEK MEDICAL CARE IF:   Your edema is not responding to treatment.  You have heart, liver, or kidney disease and notice symptoms of edema.  You have edema in your legs that does not improve after elevating them.   You have sudden and unexplained weight gain. SEEK IMMEDIATE MEDICAL CARE IF:   You develop shortness of breath or chest pain.   You cannot breathe when you lie  down.  You develop pain, redness, or warmth in the swollen areas.   You have heart, liver, or kidney disease and suddenly get edema.  You have a fever and your symptoms suddenly get worse. MAKE SURE YOU:   Understand these instructions.  Will watch your condition.  Will get help right away if you are not doing well or get worse. Document Released: 07/08/2005 Document Revised: 11/22/2013 Document Reviewed: 04/30/2013 [facility] Patient Information 2015 [place], [place]. This  information is not intended to replace advice given to you by your health care provider. Make sure you discuss any questions you have with your health care provider.

## 2015-03-19 NOTE — ED Notes (Signed)
Pt was ambulated with pulse ox. O2 dropped down to 88

## 2015-03-19 NOTE — ED Notes (Signed)
Pt ambulated to the BR and nurses station. O2 sats remained at 98% on RA. No SOB

## 2015-03-19 NOTE — ED Notes (Addendum)
Pt c/o bilateral lower extremity swelling that began since last night. Pt also states she gained 5 lbs since last night. Pt just started taking Pravastatin 2 days ago. Denies hx of CHF. Denies CP or SOB.

## 2015-03-19 NOTE — ED Provider Notes (Addendum)
CSN: [id number]     Arrival date & time 03/19/15  1511 History   First MD Initiated Contact with Patient 03/19/15 1530     Chief Complaint  Patient presents with  . Leg Swelling     (Consider location/radiation/quality/duration/timing/severity/associated sxs/prior Treatment) HPI  Patient is a very pleasant 81 year old female presenting with bilateral leg swelling 2 days. Patient was in her usual state of health which were primary care provider for normal labs. She was told to start pravastatin and started on Friday. In the last two days she's gained 6 pounds. She has bilateral leg swelling. She also has very bad aching in both of her legs. Patient's last echo in 2011 showed normal EF. She does have mitral regurg. She had status post stents to RCA,.  Patient had no chest pain last days. No trouble walking. No shortness of breath. No recent long car trips, no recent trips, no estrogens.    Past Medical History  Diagnosis Date  . Ejection fraction     EF 65 %... normal... catheterization 02/2010.  . Mitral regurgitation     mild, echo, June, 2007  . Paroxysmal supraventricular tachycardia     infrequent episodes over the years..palpitations.  . Hypertension   . Asthmatic bronchitis   . Obesity   . CAD (coronary artery disease)     bare metal stents to 2 separate RCA lesions August, 2011, residual 60% LAD, medical therapy  /  plan was for effient for one month followed by Plavix,, but patient does not tolerate Plavix... therefore patient placed back on effient  . GERD (gastroesophageal reflux disease)   . Edema 03/29/2010    September, 2011  . Intolerance of drug     aspirin and plavix  . Hypothyroidism 04/24/2011  . Hyperglycemia 04/24/2011  . Fracture of rib of left side 04/24/2011  . Hip fracture     October, 2012  . Diverticulitis     remote hx  . Diabetes mellitus     does not have  . Carotid artery disease    Past Surgical History  Procedure Laterality Date  . Knee  arthroscopy  11/2001    left knee  . Laparoscopic cholecystectomy  04/29/1996    [facility]  . Tubal ligation  09/1970  . [place]le fracture surgery  12/1993    [place]  . Sp arthro thumb*r*  05/1989    [place]  . Total abdominal hysterectomy  1983    [place]  . Cardiac catheterization  02/22/2010    2 stents, [place]  . Knee arthroscopy  02/19/2011    Procedure: ARTHROSCOPY KNEE;  Surgeon: Sanjuana Kava;  Location: [facility];  Service: Orthopedics;  Laterality: Right;  Medial and Lateral Partial Menisectomy  . Arthroplasty w/ arthroscopy medial / lateral compartment knee      [facility], should read arthroscopy not arthroplasty  . Orif hip fracture  04/25/2011    Procedure: OPEN REDUCTION INTERNAL FIXATION HIP;  Surgeon: Sanjuana Kava;  Location: [facility];  Service: Orthopedics;  Laterality: Left;  . Ileocolonscopy  12/06/11    fields:diverticula throughtout the colon/inertnal hemorrhoids/  . Esophagogastroduodenoscopy  12/06/11    fields: Negative H. pylori, hiatal hernia/moderate gastritis   Family History  Problem Relation Age of Onset  . Diabetes Mother   . Arthritis Mother   . Heart attack Father   . Stroke Father   . Emphysema Father   . Cataracts Father   . Cancer Sister   . Heart disease Sister   . Arthritis  Sister   . Arthritis Brother   . Allergies Brother   . Colon cancer Neg Hx    Social History  Substance Use Topics  . Smoking status: Never Smoker   . Smokeless tobacco: Never Used  . Alcohol Use: No     Comment: rarely   OB History    Gravida Para Term Preterm AB TAB SAB Ectopic Multiple Living   6 6 6             Review of Systems  Constitutional: Negative for activity change and fatigue.  HENT: Negative for congestion and drooling.   Eyes: Negative for discharge.  Respiratory: Negative for cough and chest tightness.   Cardiovascular: Positive for leg swelling. Negative for chest pain.  Gastrointestinal: Negative for nausea, constipation and abdominal distention.  Genitourinary:  Negative for dysuria and difficulty urinating.  Musculoskeletal: Negative for joint swelling.  Skin: Negative for rash.  Allergic/Immunologic: Negative for immunocompromised state.  Neurological: Negative for seizures and speech difficulty.  Psychiatric/Behavioral: Negative for behavioral problems and agitation.      Allergies  Celecoxib; Codeine; Hydrocodone; Tartrazine; and Aspirin  Home Medications   Prior to Admission medications   Medication Sig Start Date End Date Taking? Authorizing Provider  acetaminophen (TYLENOL) 325 MG tablet Take 325 mg by mouth every 6 (six) hours as needed for pain.    Historical Provider, MD  ADVAIR DISKUS 250-50 MCG/DOSE AEPB Inhale 1 puff into the lungs every morning. 04/06/13   Historical Provider, MD  albuterol (PROAIR HFA) 108 (90 BASE) MCG/ACT inhaler Inhale 2 puffs into the lungs every 6 (six) hours as needed. FOR SHORTNESS OF BREATH    Historical Provider, MD  ALPRAZolam Duanne Moron) 0.5 MG tablet Take 0.25-0.5 mg by mouth daily as needed. TAKE HALF TO ONE  A TABLET IF NEEDED FOR ANXIETY    Historical Provider, MD  amLODipine (NORVASC) 10 MG tablet Take 10 mg by mouth every morning.     Historical Provider, MD  CALCIUM-MAGNESUIUM-ZINC 333-133-8.3 MG TABS Take 1 tablet by mouth every morning.    Historical Provider, MD  cetirizine (ZYRTEC) 10 MG tablet Take 5 mg by mouth 2 (two) times daily.     Historical Provider, MD  diclofenac-misoprostol (ARTHROTEC 75) 75-0.2 MG per tablet Take 1 tablet by mouth 2 (two) times daily.      Historical Provider, MD  docusate sodium (COLACE) 100 MG capsule Take 100 mg by mouth 2 (two) times daily.    Historical Provider, MD  esomeprazole (NEXIUM) 40 MG capsule TAKE 1 CAPSULE (40 MG TOTAL)  BY MOUTH DAILY BEFORE BREAKFAST. 01/24/14   Danie Binder, MD  fluticasone (FLONASE) 50 MCG/ACT nasal spray  06/25/13   Historical Provider, MD  fluticasone (FLOVENT HFA) 44 MCG/ACT inhaler Inhale 1 puff into the lungs 2 (two) times daily.       Historical Provider, MD  levETIRAcetam (KEPPRA) 500 MG tablet Take 1 tablet (500 mg total) by mouth 2 (two) times daily. 09/02/14   Marcial Pacas, MD  levothyroxine (SYNTHROID, LEVOTHROID) 100 MCG tablet Take 100 mcg by mouth daily.    Historical Provider, MD  losartan (COZAAR) 25 MG tablet Take 25 mg by mouth daily.  08/26/13   Historical Provider, MD  metoprolol succinate (TOPROL-XL) 100 MG 24 hr tablet Take 100 mg by mouth 2 (two) times daily. Take with or immediately following a meal.    Historical Provider, MD  mometasone (NASONEX) 50 MCG/ACT nasal spray Place 2 sprays into the nose daily.  Historical Provider, MD  nitroGLYCERIN (NITROSTAT) 0.4 MG SL tablet Place 0.4 mg under the tongue every 5 (five) minutes as needed.      Historical Provider, MD  oxyCODONE-acetaminophen (PERCOCET) 5-325 MG per tablet Take 1 tablet by mouth every 4 (four) hours as needed. 01/03/15   Veryl Speak, MD  polyethylene glycol (MIRALAX / GLYCOLAX) packet Take 17 g by mouth daily as needed.    Historical Provider, MD  Polyvinyl Alcohol-Povidone (MURINE TEARS FOR DRY EYES) 5-6 MG/ML SOLN Place 1 drop into both eyes every morning. As needed    Historical Provider, MD  prasugrel (EFFIENT) 5 MG TABS tablet Take 1 tablet (5 mg total) by mouth daily. 03/09/15   Carlena Bjornstad, MD  traMADol (ULTRAM) 50 MG tablet Take 1 tablet (50 mg total) by mouth every 6 (six) hours as needed. 07/11/13   Nat Christen, MD   BP 134/73 mmHg  Pulse 59  Temp(Src) 97.7 F (36.5 C) (Oral)  Resp 18  Ht 5\' 2"  (1.575 m)  Wt 145 lb (65.772 kg)  BMI 26.51 kg/m2  SpO2 97% Physical Exam  Constitutional: She is oriented to person, place, and time. She appears well-developed and well-nourished.  HENT:  Head: Normocephalic and atraumatic.  Eyes: Conjunctivae are normal. Right eye exhibits no discharge.  Neck: Neck supple.  Cardiovascular: Normal rate, regular rhythm and normal heart sounds.   No murmur heard. Pulmonary/Chest: Effort normal and  breath sounds normal. She has no wheezes. She has no rales.  Abdominal: Soft. She exhibits no distension. There is no tenderness.  Musculoskeletal: Normal range of motion. She exhibits edema.  Builateral LE swelling  Neurological: She is oriented to person, place, and time. No cranial nerve deficit.  Skin: Skin is warm and dry. No rash noted. She is not diaphoretic.  Psychiatric: She has a normal mood and affect. Her behavior is normal.  Nursing note and vitals reviewed.   ED Course  Procedures (including critical care time) Labs Review Labs Reviewed - No data to display  Imaging Review No results found. I have personally reviewed and evaluated these images and lab results as part of my medical decision-making.   EKG Interpretation   Date/Time:  Sunday March 19 2015 16:01:31 EDT Ventricular Rate:  63 PR Interval:  188 QRS Duration: 106 QT Interval:  438 QTC Calculation: 448 R Axis:     Text Interpretation:  Sinus rhythm Atrial premature complexes Low voltage,  precordial leads no acute ischemia No significant change since last  tracing Confirmed by Gerald Leitz ([id number]) on 03/19/2015 4:06:17 PM      MDM   Final diagnoses:  None   patient is a 81 year old female with last known EF of 65% presenting today with bilateral leg swelling. Patient recently started on pravastatin and in the last 2 days she started the medication has gained 6 pounds had diffuse bilateral leg swelling and aching of her muscles. We will have her discontinue the drug at this time. We will check an x-ray  for fluid in lungs, and check ambulatory  pulse ox. She appears very healthy otherwise. We will stop her medication have her follow-up with her regular physician on Monday. We will have her continue to weigh herself.  5:24 PM Patient's oxygenation dipped down to 88% while walking. I talked to the patient she says she was walking faster than usual and that she has not taken her Inhaler recently. We  will get baseline labs on patient and then use inhaler and have  patient walk again.  Amaan Meyer Julio Alm, MD 03/19/15 [place], MD 06/03/15 [id number]

## 2015-07-10 ENCOUNTER — Other Ambulatory Visit: Payer: Self-pay | Admitting: Orthopaedic Surgery

## 2015-07-10 DIAGNOSIS — M545 Low back pain: Secondary | ICD-10-CM

## 2015-07-12 ENCOUNTER — Ambulatory Visit (HOSPITAL_COMMUNITY)
Admission: RE | Admit: 2015-07-12 | Discharge: 2015-07-12 | Disposition: A | Discharge status: Home / Self Care | Payer: Medicare PPO | Source: Ambulatory Visit | Attending: Orthopaedic Surgery | Admitting: Orthopaedic Surgery

## 2015-07-12 DIAGNOSIS — M5134 Other intervertebral disc degeneration, thoracic region: Secondary | ICD-10-CM | POA: Insufficient documentation

## 2015-07-12 DIAGNOSIS — M4804 Spinal stenosis, thoracic region: Secondary | ICD-10-CM | POA: Diagnosis not present

## 2015-07-12 DIAGNOSIS — M5126 Other intervertebral disc displacement, lumbar region: Secondary | ICD-10-CM | POA: Insufficient documentation

## 2015-07-12 DIAGNOSIS — M5186 Other intervertebral disc disorders, lumbar region: Secondary | ICD-10-CM | POA: Diagnosis not present

## 2015-07-12 DIAGNOSIS — M4806 Spinal stenosis, lumbar region: Secondary | ICD-10-CM | POA: Diagnosis not present

## 2015-07-12 DIAGNOSIS — M47896 Other spondylosis, lumbar region: Secondary | ICD-10-CM | POA: Insufficient documentation

## 2015-07-12 DIAGNOSIS — M545 Low back pain: Secondary | ICD-10-CM

## 2015-07-27 ENCOUNTER — Other Ambulatory Visit (HOSPITAL_COMMUNITY): Payer: Medicare PPO

## 2015-08-28 ENCOUNTER — Other Ambulatory Visit: Payer: Self-pay | Admitting: Orthopaedic Surgery

## 2015-09-04 MED ORDER — TRAMADOL HCL 50 MG PO TABS: 50.0000 mg | ORAL_TABLET | Freq: Four times a day (QID) | ORAL | Status: DC | PRN

## 2015-09-04 NOTE — Telephone Encounter (Signed)
Patient is requesting Tramadol to be refilled to [facility]. Please advise

## 2015-09-04 NOTE — Telephone Encounter (Signed)
Rx printed

## 2015-09-05 NOTE — Telephone Encounter (Signed)
Faxed as requested

## 2015-09-08 ENCOUNTER — Ambulatory Visit: Payer: Medicare HMO | Admitting: Neurology

## 2015-09-08 ENCOUNTER — Other Ambulatory Visit: Payer: Self-pay | Admitting: Neurology

## 2015-09-11 ENCOUNTER — Ambulatory Visit (INDEPENDENT_AMBULATORY_CARE_PROVIDER_SITE_OTHER): Payer: Medicare Other | Admitting: Neurology

## 2015-09-11 ENCOUNTER — Encounter: Payer: Self-pay | Admitting: Neurology

## 2015-09-11 VITALS — BP 175/85 | HR 63 | Ht <= 58 in | Wt 156.0 lb

## 2015-09-11 DIAGNOSIS — M4806 Spinal stenosis, lumbar region: Secondary | ICD-10-CM

## 2015-09-11 DIAGNOSIS — M48061 Spinal stenosis, lumbar region without neurogenic claudication: Secondary | ICD-10-CM | POA: Insufficient documentation

## 2015-09-11 DIAGNOSIS — G40209 Localization-related (focal) (partial) symptomatic epilepsy and epileptic syndromes with complex partial seizures, not intractable, without status epilepticus: Secondary | ICD-10-CM

## 2015-09-11 MED ORDER — LEVETIRACETAM 500 MG PO TABS: 500.0000 mg | ORAL_TABLET | Freq: Two times a day (BID) | ORAL | Status: DC

## 2015-09-11 NOTE — Progress Notes (Signed)
Chief Complaint  Patient presents with  . Seizures    She is here with her daughter, [name].  Feels she is doing well on Keppra.  No further concerns.  . Memory Loss    MMSE 26/30 - 12 animals.  She has good and bad days with her memory.      [facility]  PATIENT: Katie Ford DOB: 1932/02/14  HISTORICAL  Katie Ford is a 82  years old right-handed Caucasian female, accompanied by her sister, four daughters, referred by emergency room, and her primary care physician Dr. Deirdre Pippins for evaluation of recent onset confusion spells  She had a past medical history of anxiety, mitral valve regurgitation, coronary artery disease,cardiologist is Dr. Ron Parker, she is on effient, digoxin treatment.  She is a retired Careers information officer, remained to be physically active, including swimming regularly, she did has mild gait difficulty after her left hip fracture, and fixation surgery in  2012, she lives with her sister, prior her most recent hospital presentation in July 11 2013,she is very active, balance her checkbook,drive long distance without difficulty.  In July 11 2013, she was noticed to have difficulty sorted out her medications, got very frustrated, she complains of not feeling well, went to bed early that night, woke up around 1 AM in the morning, confused, she went to her sister's bedroom, on asking her where she is, later she went to bed, was noted by her family December 21st increased confusion, did not know where she is, she was taken to hospital, CAT scan of the brain showed previous old stroke in the left basal ganglion, chronic sinusitis, small vessel disease, no acute stroke,  Ultrasound of carotid arteries showed less than 50% stenosis of bilateral internal carotid artery  Bilateral lower extremity venous Doppler showed no DVT in bilateral lower extremity,  Bilateral hip X-ray demonstrated previous left hip surgery, no evidence of acute process,  She was treated  with antibiotics Keflex for sinusitis, over the past 2 weeks, she has improved, but not back to her baseline, she continued to have intermittent episodes of confusion, eye blinking spells, lasting for a few minutes, followed by extreme fatigue, tends to sleep afterwards  I have reviewed MRI together with patient and her family, MRI of the brain has demonstrated positive diffusion imaging shows a 5 mm focus of acute infarction in the midline body of the corpus callosum. No other acute infarction.  There chronic small-vessel changes affecting the pons.  There are moderate changes of chronic small vessel disease affecting the deep and subcortical cerebral hemispheric white matter. There was diffuse atrophy  She denies a previous history of seizure  UPDATE Sep 02, 2013: MRA of the brain was normal.    She fell, was taken to the emergency room, had MRI of pelvic showed no acute findings related to recent fall. Moderate right gluteus minimus tendinosis with adjacent bursal fluid. No tendon tear demonstrated. Left pelvic muscular atrophy related to remote left proximal femoral ORIF.  Lower lumbar spondylosis with chronic resulting foraminal  Stenosis.  MRI cervical in December 2014 Spondylosis at C5-6 contributes to mild central and moderate biforaminal stenosis.  Sometimes, urinary urgency since 2012, no change.   She was started on Keppra, 500mg  bid, she feels more alert, is also confirmed by her sister,  no more confusion episodes, no more spells  UPDATE Sep 02 2014: She is now taking Keppra 500mg  bid, no more seizure like event, no side effect, still swimming.  She is driving  some, word finding difficulty, short memory trouble, she lives with her sister,   UPDATE Sep 11 2015: She still swim, her low lumbar spine compression fracture  I have personally reviewed MRI lumbar in Dec 2016: Severe degenerative lumbar spondylosis with multilevel disc disease and facet disease, severe multifactorial  spinal stenosis and bilateral recess stenosis at L3-4, L4-5, broad-based left foraminal extraforaminal disc protrusion at L4-5 with significant left foraminal stenosis, Stable lower thoracic degenerative disc disease. Persistent spinal stenosis and mild malacia involving the lower thoracic cord at T11-12.  She has significant gait difficulty, low back pain, no recurrent seizure, tolerating Keppra 500 mg twice a day, continue has mild memory trouble  REVIEW OF SYSTEMS: Full 14 system review of systems performed and notable only for mild gait difficulty, knee pain   ALLERGIES: Allergies  Allergen Reactions  . Celecoxib Other (See Comments)    TACHYCARDIA  . Codeine Other (See Comments)    TACHYCARIDIA  . Hydrocodone Other (See Comments)    TACHYCARDIA  . Tartrazine Other (See Comments)    Causes Tachycardia  . Aspirin     ONLY TARTRAZINE!!! Causes Tachycardia    HOME MEDICATIONS: Outpatient Prescriptions Prior to Visit  Medication Sig Dispense Refill  . acetaminophen (TYLENOL) 500 MG tablet Take 1,000 mg by mouth every 6 (six) hours as needed for mild pain.    Marland Kitchen ADVAIR DISKUS 250-50 MCG/DOSE AEPB Inhale 1 puff into the lungs every morning.    Marland Kitchen albuterol (PROAIR HFA) 108 (90 BASE) MCG/ACT inhaler Inhale 2 puffs into the lungs every 6 (six) hours as needed. FOR SHORTNESS OF BREATH    . ALPRAZolam (XANAX) 0.5 MG tablet Take 0.25-0.5 mg by mouth daily as needed. TAKE HALF TO ONE  A TABLET IF NEEDED FOR ANXIETY    . amLODipine (NORVASC) 10 MG tablet Take 10 mg by mouth every morning.     Marland Kitchen CALCIUM-MAGNESUIUM-ZINC 333-133-8.3 MG TABS Take 1 tablet by mouth every morning.    . cetirizine (ZYRTEC) 10 MG tablet Take 5 mg by mouth 2 (two) times daily.     . diclofenac-misoprostol (ARTHROTEC 75) 75-0.2 MG per tablet Take 1 tablet by mouth 2 (two) times daily.      Marland Kitchen esomeprazole (NEXIUM) 40 MG capsule TAKE 1 CAPSULE (40 MG TOTAL)  BY MOUTH DAILY BEFORE BREAKFAST. 30 capsule 5  . fluticasone  (FLONASE) 50 MCG/ACT nasal spray Place 1 spray into both nostrils daily as needed for allergies.     . fluticasone (FLOVENT HFA) 44 MCG/ACT inhaler Inhale 1 puff into the lungs 2 (two) times daily as needed (Shortness of Breath).     . levETIRAcetam (KEPPRA) 500 MG tablet TAKE  (1)  TABLET TWICE A DAY. 60 tablet 0  . levothyroxine (SYNTHROID, LEVOTHROID) 88 MCG tablet Take 88 mcg by mouth daily before breakfast.    . losartan (COZAAR) 25 MG tablet Take 25 mg by mouth daily.     . metoprolol succinate (TOPROL-XL) 100 MG 24 hr tablet Take 100 mg by mouth 2 (two) times daily. Take with or immediately following a meal.    . mometasone (NASONEX) 50 MCG/ACT nasal spray Place 2 sprays into the nose daily as needed (Congestion).     . nitroGLYCERIN (NITROSTAT) 0.4 MG SL tablet Place 0.4 mg under the tongue every 5 (five) minutes as needed.      Marland Kitchen oxyCODONE-acetaminophen (PERCOCET) 5-325 MG per tablet Take 1 tablet by mouth every 4 (four) hours as needed. 15 tablet 0  . polyethylene  glycol (MIRALAX / GLYCOLAX) packet Take 17 g by mouth daily as needed for mild constipation.     . Polyvinyl Alcohol-Povidone (MURINE TEARS FOR DRY EYES) 5-6 MG/ML SOLN Place 1 drop into both eyes every morning. As needed    . prasugrel (EFFIENT) 5 MG TABS tablet Take 1 tablet (5 mg total) by mouth daily. 30 tablet 6  . traMADol (ULTRAM) 50 MG tablet TAKE 1 TABLET EVERY 4-6 HOURS AS NEEDED FOR PAIN 60 tablet 3  . traMADol (ULTRAM) 50 MG tablet Take 1 tablet (50 mg total) by mouth every 6 (six) hours as needed. 60 tablet 3  . docusate sodium (COLACE) 100 MG capsule Take 100 mg by mouth 2 (two) times daily as needed for mild constipation.     . pravastatin (PRAVACHOL) 40 MG tablet Take 40 mg by mouth at bedtime.     No facility-administered medications prior to visit.    PAST MEDICAL HISTORY: Past Medical History  Diagnosis Date  . Ejection fraction     EF 65 %... normal... catheterization 02/2010.  . Mitral regurgitation      mild, echo, June, 2007  . Paroxysmal supraventricular tachycardia (HCC)     infrequent episodes over the years..palpitations.  . Hypertension   . Asthmatic bronchitis   . Obesity   . CAD (coronary artery disease)     bare metal stents to 2 separate RCA lesions August, 2011, residual 60% LAD, medical therapy  /  plan was for effient for one month followed by Plavix,, but patient does not tolerate Plavix... therefore patient placed back on effient  . GERD (gastroesophageal reflux disease)   . Edema 03/29/2010    September, 2011  . Intolerance of drug     aspirin and plavix  . Hypothyroidism 04/24/2011  . Hyperglycemia 04/24/2011  . Fracture of rib of left side 04/24/2011  . Hip fracture [facility])     October, 2012  . Diverticulitis     remote hx  . Diabetes mellitus     does not have  . Carotid artery disease ([employer])     PAST SURGICAL HISTORY: Past Surgical History  Procedure Laterality Date  . Knee arthroscopy  11/2001    left knee  . Laparoscopic cholecystectomy  04/29/1996    [facility] hospital  . Tubal ligation  09/1970  . [place] fracture surgery  12/1993    [place]  . Sp arthro thumb*r*  05/1989    [place]  . Total abdominal hysterectomy  1983    [place]  . Cardiac catheterization  02/22/2010    2 stents, [place]  . Knee arthroscopy  02/19/2011    Procedure: ARTHROSCOPY KNEE;  Surgeon: Sanjuana Kava;  Location: [facility];  Service: Orthopedics;  Laterality: Right;  Medial and Lateral Partial Menisectomy  . Arthroplasty w/ arthroscopy medial / lateral compartment knee      VA, should read arthroscopy not arthroplasty  . Orif hip fracture  04/25/2011    Procedure: OPEN REDUCTION INTERNAL FIXATION HIP;  Surgeon: Sanjuana Kava;  Location: [facility];  Service: Orthopedics;  Laterality: Left;  . Ileocolonscopy  12/06/11    fields:diverticula throughtout the colon/inertnal hemorrhoids/  . Esophagogastroduodenoscopy  12/06/11    fields: Negative H. pylori, hiatal hernia/moderate gastritis    FAMILY  HISTORY: Family History  Problem Relation Age of Onset  . Diabetes Mother   . Arthritis Mother   . Heart attack Father   . Stroke Father   . Emphysema Father   . Cataracts Father   .  Cancer Sister   . Heart disease Sister   . Arthritis Sister   . Arthritis Brother   . Allergies Brother   . Colon cancer Neg Hx     SOCIAL HISTORY:  Social History   Social History  . Marital Status: Widowed    Spouse Name: N/A  . Number of Children: 6  . Years of Education: college   Occupational History  . [employer]     Education officer, museum   Social History Main Topics  . Smoking status: Never Smoker   . Smokeless tobacco: Never Used  . Alcohol Use: No     Comment: rarely  . Drug Use: No  . Sexual Activity: No   Other Topics Concern  . 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   Right handed.   Caffeine. None   Patient lives at home with her sister Bartolo Darter)            PHYSICAL EXAM   Filed Vitals:   09/11/15 1210  BP: 175/85  Pulse: 63  Height: 4\' 7"  (1.397 m)  Weight: 156 lb (70.761 kg)    Not recorded      Body mass index is 36.26 kg/(m^2).  PHYSICAL EXAMNIATION:  Gen: NAD, conversant, well nourised, obese, well groomed                     Cardiovascular: Regular rate rhythm, no peripheral edema, warm, nontender. Eyes: Conjunctivae clear without exudates or hemorrhage Neck: Supple, no carotid bruise. Pulmonary: Clear to auscultation bilaterally   NEUROLOGICAL EXAM:  MENTAL STATUS: Speech:    Speech is normal; fluent and spontaneous with normal comprehension.  Cognition: Mini-Mental Status Examination is 26/30, animal naming is 12     Orientation to time, place and person, She is not oriented to date, year     Recent and remote memory: She missed 2 out of 3 recalls     Normal Attention span and concentration     Normal Language, naming, repeating,spontaneous speech     Fund of knowledge    CRANIAL NERVES: CN II: Visual fields are full to confrontation. Fundoscopic exam is normal with sharp discs and no vascular changes. Pupils are round equal and briskly reactive to light. CN III, IV, VI: extraocular movement are normal. No ptosis. CN V: Facial sensation is intact to pinprick in all 3 divisions bilaterally. Corneal responses are intact.  CN VII: Face is symmetric with normal eye closure and smile. CN VIII: Hearing is normal to rubbing fingers CN IX, X: Palate elevates symmetrically. Phonation is normal. CN XI: Head turning and shoulder shrug are intact CN XII: Tongue is midline with normal movements and no atrophy.  MOTOR: There is no pronator drift of out-stretched arms. Muscle bulk and tone are normal. Muscle strength is normal.  REFLEXES: Reflexes are 2+ and symmetric at the biceps, triceps, knees, and ankles. Plantar responses are flexor.  SENSORY: Length dependent decreased to  light touch, pinprick and vibratory sensation to midshin level   COORDINATION: Rapid alternating movements and fine finger movements are intact. There is no dysmetria on finger-to-nose and heel-knee-shin.    GAIT/STANCE: She needs push up to get up from seated position, scoliosis, unsteady gait   DIAGNOSTIC DATA (LABS, IMAGING, TESTING) - I reviewed patient records, labs, notes, testing and imaging myself where available.  Lab Results  Component Value Date   WBC 9.3 03/19/2015  HGB 11.8* 03/19/2015   HCT 36.1 03/19/2015   MCV 91.4 03/19/2015   PLT 271 03/19/2015      Component Value Date/Time   NA 138 03/19/2015 1730   K 4.5 03/19/2015 1730   CL 105 03/19/2015 1730   CO2 29 03/19/2015 1730   GLUCOSE 113* 03/19/2015 1730   BUN 34* 03/19/2015 1730   CREATININE 1.28* 03/19/2015 1730   CALCIUM 8.6* 03/19/2015 1730   PROT 6.6 03/19/2015 1730   ALBUMIN 3.3* 03/19/2015 1730   AST 16 03/19/2015 1730   ALT 13* 03/19/2015 1730   ALKPHOS 94 03/19/2015 1730   BILITOT 0.2*  03/19/2015 1730   GFRNONAA 38* 03/19/2015 1730   GFRAA 44* 03/19/2015 1730   Lab Results  Component Value Date   CHOL  02/23/2010    132        ATP III CLASSIFICATION:  <200     mg/dL   Desirable  200-239  mg/dL   Borderline High  >=240    mg/dL   High          HDL 33* 02/23/2010   LDLCALC  02/23/2010    62        Total Cholesterol/HDL:CHD Risk Coronary Heart Disease Risk Table                     Men   Women  1/2 Average Risk   3.4   3.3  Average Risk       5.0   4.4  2 X Average Risk   9.6   7.1  3 X Average Risk  23.4   11.0        Use the calculated Patient Ratio above and the CHD Risk Table to determine the patient's CHD Risk.        ATP III CLASSIFICATION (LDL):  <100     mg/dL   Optimal  100-129  mg/dL   Near or Above                    Optimal  130-159  mg/dL   Borderline  160-189  mg/dL   High  >190     mg/dL   Very High   TRIG 184* 02/23/2010   CHOLHDL 4.0 02/23/2010   Lab Results  Component Value Date   HGBA1C 6.6* 04/25/2011   No results found for: [id number] Lab Results  Component Value Date   TSH 2.445 04/24/2011      ASSESSMENT AND PLAN  82  years old Caucasian female  presenting with acute confusion, in the setting of mild memory trouble, acute sinusitis, antibiotic treatment, MRI of the brain has demonstrates diffuse atrophy, moderate small vessel disease,   also one tiny acute stroke involving corpus callosum, which would not explain her current symptoms, her confusion episode has much improved with Keppra 500 twice a day, EEG was normal,     Complex partial seizure   Keep Keppra 500 mg twice a day  Gait abnormality  Due to multilevel lumbar stenosis     Marcial Pacas, M.D. Ph.D.  [facility] [address] [place], [place] [address] [phone]

## 2015-09-28 ENCOUNTER — Ambulatory Visit (INDEPENDENT_AMBULATORY_CARE_PROVIDER_SITE_OTHER): Payer: Medicare Other | Admitting: Orthopaedic Surgery

## 2015-09-28 VITALS — BP 172/80 | HR 67 | Temp 98.1°F | Ht <= 58 in | Wt 156.0 lb

## 2015-09-28 DIAGNOSIS — M545 Low back pain, unspecified: Secondary | ICD-10-CM

## 2015-09-28 MED ORDER — HYDROCODONE-ACETAMINOPHEN 5-325 MG PO TABS: 1.0000 | ORAL_TABLET | ORAL | Status: DC | PRN

## 2015-09-28 NOTE — Progress Notes (Signed)
Patient SO:[id number] Katie Ford, female DOB:June 04, 1932, 82 y.o. [name]  Chief Complaint  Patient presents with  . Follow-up   CC:  Back pain HPI  Katie Ford is a 82 y.o. female who has chronic back pain.  She has no paresthesias.  She has no trauma.  She has some knee pain which limits her activities. Back Pain The current episode started more than 1 year ago. The problem occurs daily. The problem has been waxing and waning since onset. The pain is present in the lumbar spine. The quality of the pain is described as aching. The pain does not radiate. The pain is at a severity of 3/10. The pain is mild. The symptoms are aggravated by bending, twisting and stress. She has tried heat, analgesics and NSAIDs for the symptoms. The treatment provided moderate relief.    Body mass index is 36.26 kg/(m^2).   Review of Systems  Constitutional:       Patient does not have Diabetes Mellitus. Patient does not have hypertension. Patient has COPD or shortness of breath. Patient has BMI > 35. Patient does not have current smoking history.   HENT: Negative.   Respiratory: Positive for shortness of breath.   Cardiovascular: Positive for palpitations and leg swelling.  Musculoskeletal: Positive for myalgias, back pain and arthralgias.  Allergic/Immunologic: Negative.   Hematological: Negative.   Psychiatric/Behavioral: Negative.     Past Medical History  Diagnosis Date  . Ejection fraction     EF 65 %... normal... catheterization 02/2010.  . Mitral regurgitation     mild, echo, June, 2007  . Paroxysmal supraventricular tachycardia (HCC)     infrequent episodes over the years..palpitations.  . Hypertension   . Asthmatic bronchitis   . Obesity   . CAD (coronary artery disease)     bare metal stents to 2 separate RCA lesions August, 2011, residual 60% LAD, medical therapy  /  plan was for effient for one month followed by Plavix,, but patient does not tolerate Plavix... therefore  patient placed back on effient  . GERD (gastroesophageal reflux disease)   . Edema 03/29/2010    September, 2011  . Intolerance of drug     aspirin and plavix  . Hypothyroidism 04/24/2011  . Hyperglycemia 04/24/2011  . Fracture of rib of left side 04/24/2011  . Hip fracture [facility])     October, 2012  . Diverticulitis     remote hx  . Diabetes mellitus     does not have  . Carotid artery disease [facility])     Past Surgical History  Procedure Laterality Date  . Knee arthroscopy  11/2001    left knee  . Laparoscopic cholecystectomy  04/29/1996    [facility]  . Tubal ligation  09/1970  . [place]  12[place]    [place]  . [place] thumb[place]  05[place]  . [place] abdominal hysterectomy  1983    [place]  . Cardiac catheterization  02/22/2010    2 stents, [place]  . Knee arthroscopy  02/19/2011    Procedure: ARTHROSCOPY KNEE;  Surgeon: Sanjuana Kava;  Location: [facility];  Service: Orthopedics;  Laterality: Right;  Medial and Lateral Partial Menisectomy  . Arthroplasty w/ arthroscopy medial / lateral compartment knee      [facility], should read arthroscopy not arthroplasty  . Orif hip fracture  04/25/2011    Procedure: OPEN REDUCTION INTERNAL FIXATION HIP;  Surgeon: Sanjuana Kava;  Location: [facility];  Service: Orthopedics;  Laterality: Left;  .  Ileocolonscopy  12/06/11    fields:diverticula throughtout the colon/inertnal hemorrhoids/  . Esophagogastroduodenoscopy  12/06/11    fields: Negative H. pylori, hiatal hernia/moderate gastritis    Family History  Problem Relation Age of Onset  . Diabetes Mother   . Arthritis Mother   . Heart attack Father   . Stroke Father   . Emphysema Father   . Cataracts Father   . Cancer Sister   . Heart disease Sister   . Arthritis Sister   . Arthritis Brother   . Allergies Brother   . Colon cancer Neg Hx     Social History Social History  Substance Use Topics  . Smoking status: Never Smoker   . Smokeless tobacco: Never Used  . Alcohol Use: No      Comment: rarely    Allergies  Allergen Reactions  . Celecoxib Other (See Comments)    TACHYCARDIA  . Codeine Other (See Comments)    TACHYCARIDIA  . Hydrocodone Other (See Comments)    TACHYCARDIA  . Tartrazine Other (See Comments)    Causes Tachycardia  . Aspirin     ONLY TARTRAZINE!!! Causes Tachycardia    Current Outpatient Prescriptions  Medication Sig Dispense Refill  . acetaminophen (TYLENOL) 500 MG tablet Take 1,000 mg by mouth every 6 (six) hours as needed for mild pain.    Marland Kitchen ADVAIR DISKUS 250-50 MCG/DOSE AEPB Inhale 1 puff into the lungs every morning.    Marland Kitchen albuterol (PROAIR HFA) 108 (90 BASE) MCG/ACT inhaler Inhale 2 puffs into the lungs every 6 (six) hours as needed. FOR SHORTNESS OF BREATH    . ALPRAZolam (XANAX) 0.5 MG tablet Take 0.25-0.5 mg by mouth daily as needed. TAKE HALF TO ONE  A TABLET IF NEEDED FOR ANXIETY    . amLODipine (NORVASC) 10 MG tablet Take 10 mg by mouth every morning.     Marland Kitchen CALCIUM-MAGNESUIUM-ZINC 333-133-8.3 MG TABS Take 1 tablet by mouth every morning.    . cetirizine (ZYRTEC) 10 MG tablet Take 5 mg by mouth 2 (two) times daily.     . diclofenac-misoprostol (ARTHROTEC 75) 75-0.2 MG per tablet Take 1 tablet by mouth 2 (two) times daily.      Marland Kitchen esomeprazole (NEXIUM) 40 MG capsule TAKE 1 CAPSULE (40 MG TOTAL)  BY MOUTH DAILY BEFORE BREAKFAST. 30 capsule 5  . fluticasone (FLONASE) 50 MCG/ACT nasal spray Place 1 spray into both nostrils daily as needed for allergies.     . fluticasone (FLOVENT HFA) 44 MCG/ACT inhaler Inhale 1 puff into the lungs 2 (two) times daily as needed (Shortness of Breath).     . levETIRAcetam (KEPPRA) 500 MG tablet Take 1 tablet (500 mg total) by mouth 2 (two) times daily. 180 tablet 3  . levothyroxine (SYNTHROID, LEVOTHROID) 88 MCG tablet Take 88 mcg by mouth daily before breakfast.    . losartan (COZAAR) 25 MG tablet Take 25 mg by mouth daily.     . metoprolol succinate (TOPROL-XL) 100 MG 24 hr tablet Take 100 mg by mouth 2  (two) times daily. Take with or immediately following a meal.    . mometasone (NASONEX) 50 MCG/ACT nasal spray Place 2 sprays into the nose daily as needed (Congestion).     . nitroGLYCERIN (NITROSTAT) 0.4 MG SL tablet Place 0.4 mg under the tongue every 5 (five) minutes as needed.      . polyethylene glycol (MIRALAX / GLYCOLAX) packet Take 17 g by mouth daily as needed for mild constipation.     Marland Kitchen  Polyvinyl Alcohol-Povidone (MURINE TEARS FOR DRY EYES) 5-6 MG/ML SOLN Place 1 drop into both eyes every morning. As needed    . prasugrel (EFFIENT) 5 MG TABS tablet Take 1 tablet (5 mg total) by mouth daily. 30 tablet 6  . HYDROcodone-acetaminophen (NORCO/VICODIN) 5-325 MG tablet Take 1 tablet by mouth every 4 (four) hours as needed for moderate pain (Must last 15 days.Do not take and drive a car or use machinery.). 60 tablet 0   No current facility-administered medications for this visit.     Physical Exam  Blood pressure 172/80, pulse 67, temperature 98.1 F (36.7 C), height 4\' 7"  (1.397 m), weight 156 lb (70.761 kg).  Constitutional: overall normal hygiene, normal nutrition, well developed, normal grooming, normal body habitus. Assistive device:cane  Musculoskeletal: gait and station Limp right, muscle tone and strength are normal, no tremors or atrophy is present.  .  Neurological: coordination overall normal.  Deep tendon reflex/nerve stretch intact.  Sensation normal.  Cranial nerves II-XII intact.   Skin:   normal overall no scars, lesions, ulcers or rashes. No psoriasis.  Psychiatric: Alert and oriented x 3.  Recent memory intact, remote memory unclear.  Normal mood and affect. Well groomed.  Good eye contact.  Cardiovascular: overall no swelling, no varicosities, no edema bilaterally, normal temperatures of the legs and arms, no clubbing, cyanosis and good capillary refill.  Lymphatic: palpation is normal. Spine/Pelvis examination:  Inspection:  Overall, sacoiliac joint benign  and hips nontender; without crepitus or defects.   Thoracic spine inspection: Alignment normal with  kyphosis present   Lumbar spine inspection:  Alignment  with normal lumbar lordosis, without scoliosis apparent.   Thoracic spine palpation:  without tenderness of spinal processes   Lumbar spine palpation: with tenderness of lumbar area; without tightness of lumbar muscles    Range of Motion:   Lumbar flexion, forward flexion is 25  without pain or tenderness    Lumbar extension is 5  without pain or tenderness   Left lateral bend is Normal  without pain or tenderness   Right lateral bend is Normal without pain or tenderness   Straight leg raising is Normal   Strength & tone: Normal   Stability overall normal stability    The patient has been educated about the nature of the problem(s) and counseled on treatment options.  The patient appeared to understand what I have discussed and is in agreement with it.  PLAN Call if any problems.  Precautions discussed.  Continue current medications.   Return to clinic 2 months

## 2015-11-04 ENCOUNTER — Telehealth: Payer: Self-pay | Admitting: Orthopaedic Surgery

## 2015-11-06 NOTE — Telephone Encounter (Signed)
Rx done. 

## 2015-11-28 ENCOUNTER — Encounter: Payer: Self-pay | Admitting: Orthopaedic Surgery

## 2015-11-28 ENCOUNTER — Ambulatory Visit (INDEPENDENT_AMBULATORY_CARE_PROVIDER_SITE_OTHER): Payer: Medicare Other | Admitting: Orthopaedic Surgery

## 2015-11-28 VITALS — BP 150/86 | HR 64 | Temp 97.7°F | Resp 16 | Ht <= 58 in | Wt 153.0 lb

## 2015-11-28 DIAGNOSIS — M545 Low back pain, unspecified: Secondary | ICD-10-CM

## 2015-11-28 DIAGNOSIS — I2510 Atherosclerotic heart disease of native coronary artery without angina pectoris: Secondary | ICD-10-CM

## 2015-11-28 DIAGNOSIS — J4520 Mild intermittent asthma, uncomplicated: Secondary | ICD-10-CM

## 2015-11-28 NOTE — Progress Notes (Signed)
Patient SO:[id number] Katie Ford, female DOB:19-Feb-1932, 82 y.o. [name]  Chief Complaint  Patient presents with  . Follow-up    back pain    HPI  Katie Ford is a 82 y.o. female who has midline lower back pain with no paresthesias.  She is stable.  She is weak and does not walk well. She uses a cane.  She has no acute episodes of the lower back.  She has no spasm.  She takes Toradol and uses heat and ice.  She is helped at home by her family in her walking.  HPI  Body mass index is 35.56 kg/(m^2).  Review of Systems  Constitutional:       Patient does not have Diabetes Mellitus. Patient does not have hypertension. Patient has COPD or shortness of breath. Patient has BMI > 35. Patient does not have current smoking history.   HENT: Negative.   Respiratory: Positive for shortness of breath.   Cardiovascular: Positive for palpitations and leg swelling.  Musculoskeletal: Positive for myalgias, back pain and arthralgias.  Allergic/Immunologic: Negative.   Hematological: Negative.   Psychiatric/Behavioral: Negative.     Past Medical History  Diagnosis Date  . Ejection fraction     EF 65 %... normal... catheterization 02/2010.  . Mitral regurgitation     mild, echo, June, 2007  . Paroxysmal supraventricular tachycardia (HCC)     infrequent episodes over the years..palpitations.  . Hypertension   . Asthmatic bronchitis   . Obesity   . CAD (coronary artery disease)     bare metal stents to 2 separate RCA lesions August, 2011, residual 60% LAD, medical therapy  /  plan was for effient for one month followed by Plavix,, but patient does not tolerate Plavix... therefore patient placed back on effient  . GERD (gastroesophageal reflux disease)   . Edema 03/29/2010    September, 2011  . Intolerance of drug     aspirin and plavix  . Hypothyroidism 04/24/2011  . Hyperglycemia 04/24/2011  . Fracture of rib of left side 04/24/2011  . Hip fracture [facility])     October, 2012  .  Diverticulitis     remote hx  . Diabetes mellitus     does not have  . Carotid artery disease [facility])     Past Surgical History  Procedure Laterality Date  . Knee arthroscopy  11/2001    left knee  . Laparoscopic cholecystectomy  04/29/1996    [facility]  . Tubal ligation  09/1970  . [place] fracture [place]  12/1993    [place]  . Sp arthro thumb*r*  05/1989    [place]  . Total abdominal hysterectomy  1983    [place]  . Cardiac catheterization  02/22/2010    2 stents, [place]  . Knee arthroscopy  02/19/2011    Procedure: ARTHROSCOPY KNEE;  Surgeon: Sanjuana Kava;  Location: [facility];  Service: Orthopedics;  Laterality: Right;  Medial and Lateral Partial Menisectomy  . Arthroplasty w/ arthroscopy medial / lateral compartment knee      [facility], should read arthroscopy not arthroplasty  . Orif hip fracture  04/25/2011    Procedure: OPEN REDUCTION INTERNAL FIXATION HIP;  Surgeon: Sanjuana Kava;  Location: [facility];  Service: Orthopedics;  Laterality: Left;  . Ileocolonscopy  12/06/11    fields:diverticula throughtout the colon/inertnal hemorrhoids/  . Esophagogastroduodenoscopy  12/06/11    fields: Negative H. pylori, hiatal hernia/moderate gastritis    Family History  Problem Relation Age of Onset  . Diabetes Mother   .  Arthritis Mother   . Heart attack Father   . Stroke Father   . Emphysema Father   . Cataracts Father   . Cancer Sister   . Heart disease Sister   . Arthritis Sister   . Arthritis Brother   . Allergies Brother   . Colon cancer Neg Hx     Social History Social History  Substance Use Topics  . Smoking status: Never Smoker   . Smokeless tobacco: Never Used  . Alcohol Use: No     Comment: rarely    Allergies  Allergen Reactions  . Celecoxib Other (See Comments)    TACHYCARDIA  . Codeine Other (See Comments)    TACHYCARIDIA  . Hydrocodone Other (See Comments)    TACHYCARDIA  . Tartrazine Other (See Comments)    Causes Tachycardia  . Aspirin     ONLY TARTRAZINE!!! Causes  Tachycardia    Current Outpatient Prescriptions  Medication Sig Dispense Refill  . acetaminophen (TYLENOL) 500 MG tablet Take 1,000 mg by mouth every 6 (six) hours as needed for mild pain.    Marland Kitchen ADVAIR DISKUS 250-50 MCG/DOSE AEPB Inhale 1 puff into the lungs every morning.    Marland Kitchen albuterol (PROAIR HFA) 108 (90 BASE) MCG/ACT inhaler Inhale 2 puffs into the lungs every 6 (six) hours as needed. FOR SHORTNESS OF BREATH    . ALPRAZolam (XANAX) 0.5 MG tablet Take 0.25-0.5 mg by mouth daily as needed. TAKE HALF TO ONE  A TABLET IF NEEDED FOR ANXIETY    . amLODipine (NORVASC) 10 MG tablet Take 10 mg by mouth every morning.     Marland Kitchen CALCIUM-MAGNESUIUM-ZINC 333-133-8.3 MG TABS Take 1 tablet by mouth every morning.    . cetirizine (ZYRTEC) 10 MG tablet Take 5 mg by mouth 2 (two) times daily.     . diclofenac-misoprostol (ARTHROTEC 75) 75-0.2 MG per tablet Take 1 tablet by mouth 2 (two) times daily.      Marland Kitchen esomeprazole (NEXIUM) 40 MG capsule TAKE 1 CAPSULE (40 MG TOTAL)  BY MOUTH DAILY BEFORE BREAKFAST. 30 capsule 5  . fluticasone (FLONASE) 50 MCG/ACT nasal spray Place 1 spray into both nostrils daily as needed for allergies.     . fluticasone (FLOVENT HFA) 44 MCG/ACT inhaler Inhale 1 puff into the lungs 2 (two) times daily as needed (Shortness of Breath).     Marland Kitchen HYDROcodone-acetaminophen (NORCO/VICODIN) 5-325 MG tablet Take 1 tablet by mouth every 4 (four) hours as needed for moderate pain (Must last 15 days.Do not take and drive a car or use machinery.). 60 tablet 0  . levETIRAcetam (KEPPRA) 500 MG tablet Take 1 tablet (500 mg total) by mouth 2 (two) times daily. 180 tablet 3  . levothyroxine (SYNTHROID, LEVOTHROID) 88 MCG tablet Take 88 mcg by mouth daily before breakfast.    . losartan (COZAAR) 25 MG tablet Take 25 mg by mouth daily.     . metoprolol succinate (TOPROL-XL) 100 MG 24 hr tablet Take 100 mg by mouth 2 (two) times daily. Take with or immediately following a meal.    . mometasone (NASONEX) 50  MCG/ACT nasal spray Place 2 sprays into the nose daily as needed (Congestion).     . nitroGLYCERIN (NITROSTAT) 0.4 MG SL tablet Place 0.4 mg under the tongue every 5 (five) minutes as needed.      . polyethylene glycol (MIRALAX / GLYCOLAX) packet Take 17 g by mouth daily as needed for mild constipation.     . Polyvinyl Alcohol-Povidone (MURINE TEARS FOR DRY EYES)  5-6 MG/ML SOLN Place 1 drop into both eyes every morning. As needed    . prasugrel (EFFIENT) 5 MG TABS tablet Take 1 tablet (5 mg total) by mouth daily. 30 tablet 6  . traMADol (ULTRAM) 50 MG tablet TAKE 1 TABLET EVERY 6 HOURS AS NEEDED FOR PAIN 60 tablet 2   No current facility-administered medications for this visit.     Physical Exam  Blood pressure 150/86, pulse 64, temperature 97.7 F (36.5 C), resp. rate 16, height 4\' 7"  (1.397 m), weight 153 lb (69.4 kg).  Constitutional: overall normal hygiene, normal nutrition, well developed, normal grooming, normal body habitus. Assistive device:cane  Musculoskeletal: gait and station Limp left, muscle tone and strength are normal, no tremors or atrophy is present.  .  Neurological: coordination overall normal.  Deep tendon reflex/nerve stretch intact.  Sensation normal.  Cranial nerves II-XII intact.   Skin:   normal overall no scars, lesions, ulcers or rashes. No psoriasis.  Psychiatric: Alert and oriented x 3.  Recent memory intact, remote memory unclear.  Normal mood and affect. Well groomed.  Good eye contact.  Cardiovascular: overall no swelling, no varicosities, no edema bilaterally, normal temperatures of the legs and arms, no clubbing, cyanosis and good capillary refill.  Lymphatic: palpation is normal.  Spine/Pelvis examination:  Inspection:  Overall, sacoiliac joint benign and hips nontender; without crepitus or defects.   Thoracic spine inspection: Alignment normal with kyphosis present   Lumbar spine inspection:  Alignment  with normal lumbar lordosis, without  scoliosis apparent.   Thoracic spine palpation:  without tenderness of spinal processes   Lumbar spine palpation: with tenderness of lumbar area; without tightness of lumbar muscles    Range of Motion:   Lumbar flexion, forward flexion is 30  with pain or tenderness    Lumbar extension is 5  with pain or tenderness   Left lateral bend is Normal  without pain or tenderness   Right lateral bend is Normal without pain or tenderness   Straight leg raising is Normal   Strength & tone: Normal   Stability overall normal stability    The patient has been educated about the nature of the problem(s) and counseled on treatment options.  The patient appeared to understand what I have discussed and is in agreement with it.  Encounter Diagnoses  Name Primary?  . Midline low back pain without sciatica Yes  . Coronary artery disease involving native coronary artery of native heart without angina pectoris   . Asthmatic bronchitis, mild intermittent, uncomplicated     PLAN Call if any problems.  Precautions discussed.  Continue current medications.   Return to clinic 6 weeks

## 2015-12-26 ENCOUNTER — Ambulatory Visit (INDEPENDENT_AMBULATORY_CARE_PROVIDER_SITE_OTHER): Payer: Medicare Other | Admitting: Cardiovascular Disease

## 2015-12-26 ENCOUNTER — Encounter: Payer: Self-pay | Admitting: Cardiovascular Disease

## 2015-12-26 VITALS — BP 130/72 | HR 54 | Ht <= 58 in | Wt 149.0 lb

## 2015-12-26 DIAGNOSIS — I10 Essential (primary) hypertension: Secondary | ICD-10-CM | POA: Diagnosis not present

## 2015-12-26 DIAGNOSIS — I471 Supraventricular tachycardia: Secondary | ICD-10-CM | POA: Diagnosis not present

## 2015-12-26 DIAGNOSIS — I2510 Atherosclerotic heart disease of native coronary artery without angina pectoris: Secondary | ICD-10-CM

## 2015-12-26 MED ORDER — NITROGLYCERIN 0.4 MG SL SUBL: 0.4000 mg | SUBLINGUAL_TABLET | SUBLINGUAL | Status: AC | PRN

## 2015-12-26 NOTE — Patient Instructions (Signed)
   Nitroglycerin refill sent to [facility] today. Continue all other medications.   Your physician wants you to follow up in:  1 year.  You will receive a reminder letter in the mail one-two months in advance.  If you don't receive a letter, please call our office to schedule the follow up appointment

## 2015-12-26 NOTE — Progress Notes (Signed)
Patient ID: Katie Ford, female   DOB: 1932/06/26, 81 y.o.   MRN: [id number]      SUBJECTIVE: The patient presents for follow-up of coronary artery disease. This my first time meeting her and she is a former patient of Dr. Ron Parker. She reportedly underwent 2 separate interventions to the RCA in August 2011 and has some residual moderate disease in the LAD. She is allergic to aspirin and intolerant of Plavix and therefore remains on Effient.  She continues to swim. She denies chest pain. She also denies palpitations and leg swelling.   Review of Systems: As per "subjective", otherwise negative.  Allergies  Allergen Reactions  . Celecoxib Other (See Comments)    TACHYCARDIA  . Codeine Other (See Comments)    TACHYCARIDIA  . Hydrocodone Other (See Comments)    TACHYCARDIA  . Tartrazine Other (See Comments)    Causes Tachycardia  . Aspirin     ONLY TARTRAZINE!!! Causes Tachycardia    Current Outpatient Prescriptions  Medication Sig Dispense Refill  . acetaminophen (TYLENOL) 500 MG tablet Take 1,000 mg by mouth every 6 (six) hours as needed for mild pain.    Marland Kitchen ADVAIR DISKUS 250-50 MCG/DOSE AEPB Inhale 1 puff into the lungs every morning.    Marland Kitchen albuterol (PROAIR HFA) 108 (90 BASE) MCG/ACT inhaler Inhale 2 puffs into the lungs every 6 (six) hours as needed. FOR SHORTNESS OF BREATH    . ALPRAZolam (XANAX) 0.5 MG tablet Take 0.25-0.5 mg by mouth daily as needed. TAKE HALF TO ONE  A TABLET IF NEEDED FOR ANXIETY    . amLODipine (NORVASC) 10 MG tablet Take 10 mg by mouth every morning.     Marland Kitchen CALCIUM-MAGNESUIUM-ZINC 333-133-8.3 MG TABS Take 1 tablet by mouth every morning.    . cetirizine (ZYRTEC) 10 MG tablet Take 5 mg by mouth 2 (two) times daily.     . diclofenac-misoprostol (ARTHROTEC 75) 75-0.2 MG per tablet Take 1 tablet by mouth 2 (two) times daily.      Marland Kitchen esomeprazole (NEXIUM) 40 MG capsule TAKE 1 CAPSULE (40 MG TOTAL)  BY MOUTH DAILY BEFORE BREAKFAST. 30 capsule 5  . fluticasone  (FLONASE) 50 MCG/ACT nasal spray Place 1 spray into both nostrils daily as needed for allergies.     . fluticasone (FLOVENT HFA) 44 MCG/ACT inhaler Inhale 1 puff into the lungs 2 (two) times daily as needed (Shortness of Breath).     Marland Kitchen HYDROcodone-acetaminophen (NORCO/VICODIN) 5-325 MG tablet Take 1 tablet by mouth every 4 (four) hours as needed for moderate pain (Must last 15 days.Do not take and drive a car or use machinery.). 60 tablet 0  . levETIRAcetam (KEPPRA) 500 MG tablet Take 1 tablet (500 mg total) by mouth 2 (two) times daily. 180 tablet 3  . levothyroxine (SYNTHROID, LEVOTHROID) 88 MCG tablet Take 88 mcg by mouth daily before breakfast.    . losartan (COZAAR) 25 MG tablet Take 25 mg by mouth daily.     . metoprolol succinate (TOPROL-XL) 100 MG 24 hr tablet Take 100 mg by mouth 2 (two) times daily. Take with or immediately following a meal.    . mometasone (NASONEX) 50 MCG/ACT nasal spray Place 2 sprays into the nose daily as needed (Congestion).     . nitroGLYCERIN (NITROSTAT) 0.4 MG SL tablet Place 0.4 mg under the tongue every 5 (five) minutes as needed.      . polyethylene glycol (MIRALAX / GLYCOLAX) packet Take 17 g by mouth daily as needed for mild constipation.     Marland Kitchen  Polyvinyl Alcohol-Povidone (MURINE TEARS FOR DRY EYES) 5-6 MG/ML SOLN Place 1 drop into both eyes every morning. As needed    . prasugrel (EFFIENT) 5 MG TABS tablet Take 1 tablet (5 mg total) by mouth daily. 30 tablet 6  . traMADol (ULTRAM) 50 MG tablet TAKE 1 TABLET EVERY 6 HOURS AS NEEDED FOR PAIN 60 tablet 2   No current facility-administered medications for this visit.    Past Medical History  Diagnosis Date  . Ejection fraction     EF 65 %... normal... catheterization 02/2010.  . Mitral regurgitation     mild, echo, June, 2007  . Paroxysmal supraventricular tachycardia (HCC)     infrequent episodes over the years..palpitations.  . Hypertension   . Asthmatic bronchitis   . Obesity   . CAD (coronary  artery disease)     bare metal stents to 2 separate RCA lesions August, 2011, residual 60% LAD, medical therapy  /  plan was for effient for one month followed by Plavix,, but patient does not tolerate Plavix... therefore patient placed back on effient  . GERD (gastroesophageal reflux disease)   . Edema 03/29/2010    September, 2011  . Intolerance of drug     aspirin and plavix  . Hypothyroidism 04/24/2011  . Hyperglycemia 04/24/2011  . Fracture of rib of left side 04/24/2011  . Hip fracture [facility])     October, 2012  . Diverticulitis     remote hx  . Diabetes mellitus     does not have  . Carotid artery disease [facility])     Past Surgical History  Procedure Laterality Date  . Knee arthroscopy  11/2001    left knee  . Laparoscopic cholecystectomy  04/29/1996    [facility] hospital  . Tubal ligation  09/1970  . [place] fracture [place]  12/1993    [place]  . [place] arthro thumb*r*  05/1989    [place]  . [place] abdominal hysterectomy  1983    [place]  . Cardiac catheterization  02/22/2010    2 stents, [place]  . Knee arthroscopy  02/19/2011    Procedure: ARTHROSCOPY KNEE;  Surgeon: Sanjuana Kava;  Location: [facility];  Service: Orthopedics;  Laterality: Right;  Medial and Lateral Partial Menisectomy  . Arthroplasty w/ arthroscopy medial / lateral compartment knee      [place], should read arthroscopy not arthroplasty  . Orif hip fracture  04/25/2011    Procedure: OPEN REDUCTION INTERNAL FIXATION HIP;  Surgeon: Sanjuana Kava;  Location: [facility];  Service: Orthopedics;  Laterality: Left;  . Ileocolonscopy  12/06/11    fields:diverticula throughtout the colon/inertnal hemorrhoids/  . Esophagogastroduodenoscopy  12/06/11    fields: Negative H. pylori, hiatal hernia/moderate gastritis    Social History   Social History  . Marital Status: Widowed    Spouse Name: N/A  . Number of Children: 6  . Years of Education: college   Occupational History  . [employer]     Education officer, museum   Social History Main Topics  . Smoking status:  Never Smoker   . Smokeless tobacco: Never Used  . Alcohol Use: No     Comment: rarely  . Drug Use: No  . Sexual Activity: No   Other Topics Concern  . 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   Right handed.   Caffeine. None   Patient lives at home with her sister Bartolo Darter)  Filed Vitals:   12/26/15 1446  BP: 130/72  Pulse: 54  Height: 4\' 7"  (1.397 m)  Weight: 149 lb (67.586 kg)  SpO2: 98%    PHYSICAL EXAM General: NAD HEENT: Normal. Neck: No JVD, no thyromegaly. Lungs: Clear to auscultation bilaterally with normal respiratory effort. CV: Nondisplaced PMI.  Regular rate and rhythm with occasional premature contractions, normal S1/S2, no S3/S4, no murmur. No pretibial or periankle edema.  No carotid bruit.   Abdomen: Soft, nontender, no distention.  Neurologic: Alert and oriented.  Psych: Normal affect. Skin: Normal. Musculoskeletal: No gross deformities.    ECG: Most recent ECG reviewed.      ASSESSMENT AND PLAN: 1. CAD: Symptomatically stable. Continue Effient and metoprolol. Will refill nitroglycerin.  2. Essential HTN: Controlled. No changes.  3. PSVT: Symptomatically stable on metoprolol. No changes.  Dispo: fu 1 year.  Kate Sable, M.D., F.A.C.C.

## 2015-12-28 ENCOUNTER — Telehealth: Payer: Self-pay | Admitting: Orthopaedic Surgery

## 2015-12-28 NOTE — Telephone Encounter (Signed)
Rx done. 

## 2015-12-29 ENCOUNTER — Emergency Department (HOSPITAL_COMMUNITY): Payer: Medicare Other

## 2015-12-29 ENCOUNTER — Encounter (HOSPITAL_COMMUNITY): Payer: Self-pay | Admitting: Cardiology

## 2015-12-29 ENCOUNTER — Emergency Department (HOSPITAL_COMMUNITY)
Admission: EM | Admit: 2015-12-29 | Discharge: 2015-12-29 | Disposition: A | Discharge status: Home / Self Care | Payer: Medicare Other | Attending: Emergency Medicine | Admitting: Emergency Medicine

## 2015-12-29 DIAGNOSIS — E039 Hypothyroidism, unspecified: Secondary | ICD-10-CM | POA: Diagnosis not present

## 2015-12-29 DIAGNOSIS — M542 Cervicalgia: Secondary | ICD-10-CM | POA: Insufficient documentation

## 2015-12-29 DIAGNOSIS — Z79899 Other long term (current) drug therapy: Secondary | ICD-10-CM | POA: Insufficient documentation

## 2015-12-29 DIAGNOSIS — I2510 Atherosclerotic heart disease of native coronary artery without angina pectoris: Secondary | ICD-10-CM | POA: Insufficient documentation

## 2015-12-29 DIAGNOSIS — N390 Urinary tract infection, site not specified: Secondary | ICD-10-CM | POA: Insufficient documentation

## 2015-12-29 DIAGNOSIS — I10 Essential (primary) hypertension: Secondary | ICD-10-CM | POA: Diagnosis not present

## 2015-12-29 DIAGNOSIS — E1165 Type 2 diabetes mellitus with hyperglycemia: Secondary | ICD-10-CM | POA: Diagnosis not present

## 2015-12-29 DIAGNOSIS — R509 Fever, unspecified: Secondary | ICD-10-CM | POA: Diagnosis present

## 2015-12-29 LAB — URINALYSIS, ROUTINE W REFLEX MICROSCOPIC
Bilirubin Urine: NEGATIVE
GLUCOSE, UA: NEGATIVE mg/dL
Nitrite: NEGATIVE
Protein, ur: 30 mg/dL — AB
SPECIFIC GRAVITY, URINE: 1.02 (ref 1.005–1.030)
pH: 6 (ref 5.0–8.0)

## 2015-12-29 LAB — CBC WITH DIFFERENTIAL/PLATELET
BASOS PCT: 0 %
Basophils Absolute: 0 10*3/uL (ref 0.0–0.1)
EOS ABS: 0.1 10*3/uL (ref 0.0–0.7)
EOS PCT: 0 %
HCT: 38.8 % (ref 36.0–46.0)
HEMOGLOBIN: 12.6 g/dL (ref 12.0–15.0)
Lymphocytes Relative: 14 %
Lymphs Abs: 2.7 10*3/uL (ref 0.7–4.0)
MCH: 28.8 pg (ref 26.0–34.0)
MCHC: 32.5 g/dL (ref 30.0–36.0)
MCV: 88.6 fL (ref 78.0–100.0)
MONOS PCT: 7 %
Monocytes Absolute: 1.4 10*3/uL — ABNORMAL HIGH (ref 0.1–1.0)
NEUTROS PCT: 79 %
Neutro Abs: 15.3 10*3/uL — ABNORMAL HIGH (ref 1.7–7.7)
PLATELETS: 351 10*3/uL (ref 150–400)
RBC: 4.38 MIL/uL (ref 3.87–5.11)
RDW: 13 % (ref 11.5–15.5)
WBC: 19.4 10*3/uL — AB (ref 4.0–10.5)

## 2015-12-29 LAB — URINE MICROSCOPIC-ADD ON

## 2015-12-29 LAB — COMPREHENSIVE METABOLIC PANEL
ALBUMIN: 2.9 g/dL — AB (ref 3.5–5.0)
ALK PHOS: 86 U/L (ref 38–126)
ALT: 18 U/L (ref 14–54)
ANION GAP: 8 (ref 5–15)
AST: 17 U/L (ref 15–41)
BUN: 18 mg/dL (ref 6–20)
CALCIUM: 8.8 mg/dL — AB (ref 8.9–10.3)
CHLORIDE: 96 mmol/L — AB (ref 101–111)
CO2: 27 mmol/L (ref 22–32)
Creatinine, Ser: 1.09 mg/dL — ABNORMAL HIGH (ref 0.44–1.00)
GFR calc non Af Amer: 46 mL/min — ABNORMAL LOW (ref >60)
GFR, EST AFRICAN AMERICAN: 54 mL/min — AB (ref >60)
GLUCOSE: 106 mg/dL — AB (ref 65–99)
POTASSIUM: 3.5 mmol/L (ref 3.5–5.1)
SODIUM: 131 mmol/L — AB (ref 135–145)
Total Bilirubin: 0.8 mg/dL (ref 0.3–1.2)
Total Protein: 7.9 g/dL (ref 6.5–8.1)

## 2015-12-29 LAB — TROPONIN I

## 2015-12-29 MED ORDER — SULFAMETHOXAZOLE-TRIMETHOPRIM 800-160 MG PO TABS
1.0000 | ORAL_TABLET | Freq: Once | ORAL | Status: AC
  Administered 2015-12-29: 1 via ORAL
  Filled 2015-12-29: qty 1

## 2015-12-29 MED ORDER — SODIUM CHLORIDE 0.9 % IV SOLN
Freq: Once | INTRAVENOUS | Status: AC
  Administered 2015-12-29: 14:00:00 via INTRAVENOUS

## 2015-12-29 MED ORDER — SULFAMETHOXAZOLE-TRIMETHOPRIM 800-160 MG PO TABS: 1.0000 | ORAL_TABLET | Freq: Two times a day (BID) | ORAL | Status: AC

## 2015-12-29 MED ORDER — SODIUM CHLORIDE 0.9 % IV BOLUS (SEPSIS)
1000.0000 mL | Freq: Once | INTRAVENOUS | Status: AC
  Administered 2015-12-29: 1000 mL via INTRAVENOUS

## 2015-12-29 NOTE — ED Notes (Signed)
MD at bedside. 

## 2015-12-29 NOTE — ED Notes (Signed)
Pt went to [facility]'s office this morning due to waking up with neck pain, denies any injury to neck.  Was told that she had a fever of 101 taken by ear by nurse in office today. States that neck is sore. Pt did state that she has been more tired than usual for past week.   Pt has not taken anything for fever and pt was afebrile in triage.

## 2015-12-29 NOTE — ED Provider Notes (Signed)
CSN: [id number]     Arrival date & time 12/29/15  1217 History  By signing my name below, I, Eustaquio Maize, attest that this documentation has been prepared under the direction and in the presence of Noemi Chapel, MD. Electronically Signed: Eustaquio Maize, ED Scribe. 12/29/2015. 1:14 PM.    Chief Complaint  Patient presents with  . Fever   The history is provided by the patient and a relative. No language interpreter was used.   HPI Comments: Katie Ford is a 82 y.o. female with PMHx CAD, HTN, DM, and stroke who presents to the Emergency Department complaining of worsening fatigue and neck pain onset this morning. Pt states she felt fatigued last night but felt worse this morning. She notes associated coughing and voice hoarseness.Denies any headache, urinary problems, body aches, fever, nausea, vomiting, diarrhea,chest pain, palpitations, hematochezia, leg swelling, numbness, focal weakness, sore throat. No recent travel.  Last stroke was six years ago. Pt is non smoker.  Sx are constant ,mild, nothing makes better or worse.  Past Medical History  Diagnosis Date  . Ejection fraction     EF 65 %... normal... catheterization 02/2010.  . Mitral regurgitation     mild, echo, June, 2007  . Paroxysmal supraventricular tachycardia (HCC)     infrequent episodes over the years..palpitations.  . Hypertension   . Asthmatic bronchitis   . Obesity   . CAD (coronary artery disease)     bare metal stents to 2 separate RCA lesions August, 2011, residual 60% LAD, medical therapy  /  plan was for effient for one month followed by Plavix,, but patient does not tolerate Plavix... therefore patient placed back on effient  . GERD (gastroesophageal reflux disease)   . Edema 03/29/2010    September, 2011  . Intolerance of drug     aspirin and plavix  . Hypothyroidism 04/24/2011  . Hyperglycemia 04/24/2011  . Fracture of rib of left side 04/24/2011  . Hip fracture [facility])     October, 2012  .  Diverticulitis     remote hx  . Diabetes mellitus     does not have  . Carotid artery disease [facility])    Past Surgical History  Procedure Laterality Date  . Knee arthroscopy  11/2001    left knee  . Laparoscopic cholecystectomy  04/29/1996    [facility] hospital  . Tubal ligation  09/1970  . [place] fracture [place]  12/1993    [place]  . Sp arthro thumb*r*  05/1989    [place]  . [place] abdominal hysterectomy  1983    [place]  . Cardiac catheterization  02/22/2010    2 stents, [place]  . Knee arthroscopy  02/19/2011    Procedure: ARTHROSCOPY KNEE;  Surgeon: Sanjuana Kava;  Location: [facility];  Service: Orthopedics;  Laterality: Right;  Medial and Lateral Partial Menisectomy  . Arthroplasty w/ arthroscopy medial / lateral compartment knee      [facility], should read arthroscopy not arthroplasty  . Orif hip fracture  04/25/2011    Procedure: OPEN REDUCTION INTERNAL FIXATION HIP;  Surgeon: Sanjuana Kava;  Location: [facility];  Service: Orthopedics;  Laterality: Left;  . Ileocolonscopy  12/06/11    fields:diverticula throughtout the colon/inertnal hemorrhoids/  . Esophagogastroduodenoscopy  12/06/11    fields: Negative H. pylori, hiatal hernia/moderate gastritis   Family History  Problem Relation Age of Onset  . Diabetes Mother   . Arthritis Mother   . Heart attack Father   . Stroke Father   . Emphysema Father   .  Cataracts Father   . Cancer Sister   . Heart disease Sister   . Arthritis Sister   . Arthritis Brother   . Allergies Brother   . Colon cancer Neg Hx    Social History  Substance Use Topics  . Smoking status: Never Smoker   . Smokeless tobacco: Never Used  . Alcohol Use: No     Comment: rarely   OB History    Gravida Para Term Preterm AB TAB SAB Ectopic Multiple Living   6 6 6             Review of Systems  Constitutional: Positive for fatigue. Negative for fever and chills.  HENT: Positive for voice change. Negative for sore throat.   Cardiovascular: Negative for chest pain, palpitations and leg  swelling.  Gastrointestinal: Negative for nausea, vomiting and blood in stool.  Genitourinary: Negative for dysuria and difficulty urinating.  Musculoskeletal: Positive for neck pain. Negative for myalgias.  Neurological: Negative for weakness, numbness and headaches.  All other systems reviewed and are negative.     Allergies  Celecoxib; Codeine; Hydrocodone; Tartrazine; and Aspirin  Home Medications   Prior to Admission medications   Medication Sig Start Date End Date Taking? Authorizing Provider  acetaminophen (TYLENOL) 500 MG tablet Take 1,000 mg by mouth every 6 (six) hours as needed for mild pain.   Yes Historical Provider, MD  ADVAIR DISKUS 250-50 MCG/DOSE AEPB Inhale 1 puff into the lungs every morning. 04/06/13  Yes Historical Provider, MD  albuterol (PROAIR HFA) 108 (90 BASE) MCG/ACT inhaler Inhale 2 puffs into the lungs every 6 (six) hours as needed. FOR SHORTNESS OF BREATH   Yes Historical Provider, MD  albuterol (PROVENTIL) (2.5 MG/3ML) 0.083% nebulizer solution Take 2.5 mg by nebulization daily as needed. 12/12/15  Yes Historical Provider, MD  ALPRAZolam Duanne Moron) 0.5 MG tablet Take 0.25-0.5 mg by mouth daily as needed. TAKE HALF TO ONE  A TABLET IF NEEDED FOR ANXIETY   Yes Historical Provider, MD  amLODipine (NORVASC) 10 MG tablet Take 10 mg by mouth every morning.    Yes Historical Provider, MD  BREO ELLIPTA 100-25 MCG/INH AEPB Inhale 1 puff into the lungs daily. 12/04/15  Yes Historical Provider, MD  CALCIUM-MAGNESUIUM-ZINC 333-133-8.3 MG TABS Take 1 tablet by mouth every morning.   Yes Historical Provider, MD  cetirizine (ZYRTEC) 10 MG tablet Take 5 mg by mouth 2 (two) times daily.    Yes Historical Provider, MD  diclofenac-misoprostol (ARTHROTEC 75) 75-0.2 MG per tablet Take 1 tablet by mouth 2 (two) times daily.     Yes Historical Provider, MD  esomeprazole (NEXIUM) 40 MG capsule TAKE 1 CAPSULE (40 MG TOTAL)  BY MOUTH DAILY BEFORE BREAKFAST. 01/24/14  Yes Danie Binder, MD   fluticasone (FLONASE) 50 MCG/ACT nasal spray Place 1 spray into both nostrils daily as needed for allergies.  06/25/13  Yes Historical Provider, MD  fluticasone (FLOVENT HFA) 44 MCG/ACT inhaler Inhale 1 puff into the lungs 2 (two) times daily as needed (Shortness of Breath).    Yes Historical Provider, MD  levETIRAcetam (KEPPRA) 500 MG tablet Take 1 tablet (500 mg total) by mouth 2 (two) times daily. 09/11/15  Yes Marcial Pacas, MD  levothyroxine (SYNTHROID, LEVOTHROID) 88 MCG tablet Take 88 mcg by mouth daily before breakfast.   Yes Historical Provider, MD  losartan (COZAAR) 25 MG tablet Take 25 mg by mouth daily.  08/26/13  Yes Historical Provider, MD  metoprolol succinate (TOPROL-XL) 100 MG 24 hr tablet Take 100  mg by mouth daily. Take with or immediately following a meal.   Yes Historical Provider, MD  mometasone (NASONEX) 50 MCG/ACT nasal spray Place 2 sprays into the nose daily as needed (Congestion).    Yes Historical Provider, MD  nitroGLYCERIN (NITROSTAT) 0.4 MG SL tablet Place 1 tablet (0.4 mg total) under the tongue every 5 (five) minutes as needed for chest pain. 12/26/15  Yes Herminio Commons, MD  polyethylene glycol (MIRALAX / GLYCOLAX) packet Take 17 g by mouth daily as needed for mild constipation.    Yes Historical Provider, MD  Polyvinyl Alcohol-Povidone (MURINE TEARS FOR DRY EYES) 5-6 MG/ML SOLN Place 1 drop into both eyes every morning. As needed   Yes Historical Provider, MD  prasugrel (EFFIENT) 5 MG TABS tablet Take 1 tablet (5 mg total) by mouth daily. 03/09/15  Yes Carlena Bjornstad, MD  traMADol (ULTRAM) 50 MG tablet TAKE 1 TABLET EVERY 6 HOURS AS NEEDED FOR PAIN 12/28/15  Yes Sanjuana Kava, MD  HYDROcodone-acetaminophen (NORCO/VICODIN) 5-325 MG tablet Take 1 tablet by mouth every 4 (four) hours as needed for moderate pain (Must last 15 days.Do not take and drive a car or use machinery.). Patient not taking: Reported on 12/29/2015 09/28/15   Sanjuana Kava, MD  sulfamethoxazole-trimethoprim  (BACTRIM DS,SEPTRA DS) 800-160 MG tablet Take 1 tablet by mouth 2 (two) times daily. 12/29/15 01/05/16  Noemi Chapel, MD   BP 122/57 mmHg  Pulse 43  Temp(Src) 98.4 F (36.9 C) (Oral)  Resp 26  Ht 5' (1.524 m)  Wt 149 lb (67.586 kg)  BMI 29.10 kg/m2  SpO2 73% Physical Exam  Constitutional: She appears well-developed and well-nourished. No distress.  HENT:  Head: Normocephalic and atraumatic.  Mouth/Throat: Oropharynx is clear and moist. No oropharyngeal exudate.  Eyes: Conjunctivae and EOM are normal. Pupils are equal, round, and reactive to light. Right eye exhibits no discharge. Left eye exhibits no discharge. No scleral icterus.  Neck: Normal range of motion. Neck supple. No JVD present. No thyromegaly present.  Cardiovascular: Normal rate, regular rhythm, normal heart sounds and intact distal pulses.  Exam reveals no gallop and no friction rub.   No murmur heard. Pulmonary/Chest: Effort normal and breath sounds normal. No respiratory distress. She has no wheezes. She has no rales.  Abdominal: Soft. Bowel sounds are normal. She exhibits no distension and no mass. There is no tenderness.  Musculoskeletal: Normal range of motion. She exhibits no edema or tenderness.  Lymphadenopathy:    She has no cervical adenopathy.  Neurological: She is alert. Coordination normal.  Neurologic exam:  Speech clear, pupils equal round reactive to light, extraocular movements intact  Normal peripheral visual fields Cranial nerves III through XII normal including no facial droop Follows commands, moves all extremities x4, normal strength to bilateral upper and lower extremities at all major muscle groups including grip Sensation normal to light touch and pinprick Coordination intact, no limb ataxia, finger-nose-finger normal Rapid alternating movements normal No pronator drift Gait normal   Skin: Skin is warm and dry. No rash noted. No erythema.  Psychiatric: She has a normal mood and affect. Her  behavior is normal.  Nursing note and vitals reviewed.   ED Course  Procedures  DIAGNOSTIC STUDIES: Oxygen Saturation is 100% on RA, normal by my interpretation.    COORDINATION OF CARE: 12:58 PM-Discussed treatment plan which includes CBC, CMP, troponin, and urinalysis with pt at bedside and pt agreed to plan.   Labs Review Labs Reviewed  CBC WITH DIFFERENTIAL/PLATELET -  Abnormal; Notable for the following:    WBC 19.4 (*)    Neutro Abs 15.3 (*)    Monocytes Absolute 1.4 (*)    All other components within normal limits  COMPREHENSIVE METABOLIC PANEL - Abnormal; Notable for the following:    Sodium 131 (*)    Chloride 96 (*)    Glucose, Bld 106 (*)    Creatinine, Ser 1.09 (*)    Calcium 8.8 (*)    Albumin 2.9 (*)    GFR calc non Af Amer 46 (*)    GFR calc Af Amer 54 (*)    All other components within normal limits  URINALYSIS, ROUTINE W REFLEX MICROSCOPIC (NOT AT [facility]) - Abnormal; Notable for the following:    APPearance HAZY (*)    Hgb urine dipstick SMALL (*)    Ketones, ur TRACE (*)    Protein, ur 30 (*)    Leukocytes, UA SMALL (*)    All other components within normal limits  URINE MICROSCOPIC-ADD ON - Abnormal; Notable for the following:    Squamous Epithelial / LPF 0-5 (*)    Bacteria, UA MANY (*)    All other components within normal limits  URINE CULTURE  TROPONIN I    Imaging Review Dg Chest 2 View  12/29/2015  CLINICAL DATA:  82 year old with acute onset of fever and neck pain. Current history of hypertension and diabetes. EXAM: CHEST  2 VIEW COMPARISON:  03/19/2015 and earlier. FINDINGS: Cardiac silhouette normal in size, unchanged. Thoracic aorta atherosclerotic and tortuous, unchanged. Hilar and mediastinal contours otherwise unremarkable. Linear atelectasis involving the left lower lobe and lingula. Lungs otherwise clear. No localized airspace consolidation. No pleural effusions. No pneumothorax. Normal pulmonary vascularity. Bronchovascular markings normal.  Compression fracture involving T12 with acute kyphos deformity, unchanged. Severe degenerative changes involving the shoulders. Generalized osseous demineralization. IMPRESSION: Linear atelectasis involving the left lower lobe and lingula. No acute cardiopulmonary disease otherwise. Electronically Signed   By: Evangeline Dakin M.D.   On: 12/29/2015 16:28   I have personally reviewed and evaluated these images and lab results as part of my medical decision-making.   EKG Interpretation   Date/Time:  Friday December 29 2015 13:28:54 EDT Ventricular Rate:  71 PR Interval:  155 QRS Duration: 113 QT Interval:  376 QTC Calculation: 409 R Axis:   -19 Text Interpretation:  Sinus rhythm Atrial premature complexes Borderline  intraventricular conduction delay since last tracing no significant change  Confirmed by Tamyah Cutbirth  MD, Marjean Imperato ([id number]) on 12/29/2015 1:33:38 PM      MDM   Final diagnoses:  UTI (lower urinary tract infection)    I personally performed the services described in this documentation, which was scribed in my presence. The recorded information has been reviewed and is accurate.    The pt has no acute findings - VS are normal - exam is unremarkable - she has a fatigue / and hoarse voice but NO other findings or c/o - will get some labs / UA / ECG - pt in NAD.  No focal weakness.  Labs are unremarkable other than UTI and WBC elevation - pt informed - no fever here meds given Stable for d/c Ambulatory without difficulty Well appaerin gon  D/c. Culture sent  Meds given in ED:  Medications  sulfamethoxazole-trimethoprim (BACTRIM DS,SEPTRA DS) 800-160 MG per tablet 1 tablet (not administered)  0.9 %  sodium chloride infusion ( Intravenous Paused 12/29/15 1421)  sodium chloride 0.9 % bolus 1,000 mL (0 mLs Intravenous Stopped 12/29/15  1649)    New Prescriptions   SULFAMETHOXAZOLE-TRIMETHOPRIM (BACTRIM DS,SEPTRA DS) 800-160 MG TABLET    Take 1 tablet by mouth 2 (two) times daily.       Noemi Chapel, MD 12/29/15 [phone]

## 2015-12-29 NOTE — ED Notes (Signed)
Informed pt 10 minutes ago that we are waiting on urine sample.  Asked if in and out cath ok and pt stated yes.  Brooke,NT notified and ended up assisting pt to BR. Pt and pt's family upset concerning wait and requesting to see doctor and to leave and go to another hospital.  EDP made aware of pt and family upset and stated that he would speak with them.

## 2015-12-29 NOTE — Discharge Instructions (Signed)

## 2015-12-29 NOTE — ED Notes (Signed)
Fever and neck pain times one week.

## 2016-01-01 LAB — URINE CULTURE: Culture: >=100000 — AB

## 2016-01-02 ENCOUNTER — Telehealth (HOSPITAL_BASED_OUTPATIENT_CLINIC_OR_DEPARTMENT_OTHER): Payer: Self-pay | Admitting: Emergency Medicine

## 2016-01-02 ENCOUNTER — Ambulatory Visit: Payer: Medicare Other | Admitting: Cardiovascular Disease

## 2016-01-02 NOTE — Telephone Encounter (Signed)
Post ED Visit - Positive Culture Follow-up  Culture report reviewed by antimicrobial stewardship pharmacist:  []  Elenor Quinones, Pharm.D. []  Heide Guile, Pharm.D., BCPS []  Parks Neptune, Pharm.D. []  Alycia Rossetti, Pharm.D., BCPS []  [place], Pharm.D., BCPS, AAHIVP []  Legrand Como, Pharm.D., BCPS, AAHIVP [x]  Milus Glazier, Pharm.D. []  Stephens November, Pharm.D.  Positive urine culture Treated with bactrim DS, organism sensitive to the same and no further patient follow-up is required at this time.  Hazle Nordmann 01/02/2016, 9:45 AM

## 2016-01-09 ENCOUNTER — Encounter: Payer: Self-pay | Admitting: Orthopaedic Surgery

## 2016-01-09 ENCOUNTER — Ambulatory Visit (INDEPENDENT_AMBULATORY_CARE_PROVIDER_SITE_OTHER): Payer: Medicare Other | Admitting: Orthopaedic Surgery

## 2016-01-09 VITALS — BP 134/81 | HR 76 | Ht 61.0 in | Wt 149.0 lb

## 2016-01-09 DIAGNOSIS — J4520 Mild intermittent asthma, uncomplicated: Secondary | ICD-10-CM | POA: Diagnosis not present

## 2016-01-09 DIAGNOSIS — M545 Low back pain, unspecified: Secondary | ICD-10-CM

## 2016-01-09 DIAGNOSIS — I2510 Atherosclerotic heart disease of native coronary artery without angina pectoris: Secondary | ICD-10-CM

## 2016-01-09 MED ORDER — TRAMADOL HCL 50 MG PO TABS: 50.0000 mg | ORAL_TABLET | Freq: Four times a day (QID) | ORAL | Status: DC | PRN

## 2016-01-09 NOTE — Progress Notes (Signed)
Patient Katie Ford, female DOB:[birth year], 82 y.o. [name]  Chief Complaint  Patient presents with  . Follow-up    6 week follow up back and leg pain     HPI  Katie Ford is a 82 y.o. female who has chronic lower back pain.  She is much improved today.  She has less pain, no paresthesias.  She has been more active.  She had UTI which "laid me up for while".  But that is resolved and she is much more active. She has no trauma.  HPI  Body mass index is 28.17 kg/(m^2).  ROS  Review of Systems  Constitutional:       Patient does not have Diabetes Mellitus. Patient does not have hypertension. Patient has COPD or shortness of breath. Patient has BMI > 35. Patient does not have current smoking history.   HENT: Negative.   Respiratory: Positive for shortness of breath.   Cardiovascular: Positive for palpitations and leg swelling.  Musculoskeletal: Positive for myalgias, back pain and arthralgias.  Allergic/Immunologic: Negative.   Hematological: Negative.   Psychiatric/Behavioral: Negative.     Past Medical History  Diagnosis Date  . Ejection fraction     EF 65 %... normal... catheterization 02/2010.  . Mitral regurgitation     mild, echo, June, 2007  . Paroxysmal supraventricular tachycardia (HCC)     infrequent episodes over the years..palpitations.  . Hypertension   . Asthmatic bronchitis   . Obesity   . CAD (coronary artery disease)     bare metal stents to 2 separate RCA lesions August, 2011, residual 60% LAD, medical therapy  /  plan was for effient for one month followed by Plavix,, but patient does not tolerate Plavix... therefore patient placed back on effient  . GERD (gastroesophageal reflux disease)   . Edema 03/29/2010    September, 2011  . Intolerance of drug     aspirin and plavix  . Hypothyroidism 04/24/2011  . Hyperglycemia 04/24/2011  . Fracture of rib of left side 04/24/2011  . Hip fracture [facility])     October, 2012  . Diverticulitis      remote hx  . Diabetes mellitus     does not have  . Carotid artery disease [facility])     Past Surgical History  Procedure Laterality Date  . Knee arthroscopy  11/2001    left knee  . Laparoscopic cholecystectomy  04/29/1996    [facility] hospital  . Tubal ligation  09/1970  . [place]  . [place] thumb[place]  05[place]  . [place] abdominal hysterectomy  1983    [place]  . Cardiac catheterization  02/22/2010    2 stents, [place]  . Knee arthroscopy  02/19/2011    Procedure: ARTHROSCOPY KNEE;  Surgeon: Sanjuana Kava;  Location: [facility];  Service: Orthopedics;  Laterality: Right;  Medial and Lateral Partial Menisectomy  . Arthroplasty w/ arthroscopy medial / lateral compartment knee      [facility], should read arthroscopy not arthroplasty  . Orif hip fracture  04/25/2011    Procedure: OPEN REDUCTION INTERNAL FIXATION HIP;  Surgeon: Sanjuana Kava;  Location: [facility];  Service: Orthopedics;  Laterality: Left;  . Ileocolonscopy  12/06/11    fields:diverticula throughtout the colon/inertnal hemorrhoids/  . Esophagogastroduodenoscopy  12/06/11    fields: Negative H. pylori, hiatal hernia/moderate gastritis    Family History  Problem Relation Age of Onset  . Diabetes Mother   . Arthritis Mother   .  Heart attack Father   . Stroke Father   . Emphysema Father   . Cataracts Father   . Cancer Sister   . Heart disease Sister   . Arthritis Sister   . Arthritis Brother   . Allergies Brother   . Colon cancer Neg Hx     Social History Social History  Substance Use Topics  . Smoking status: Never Smoker   . Smokeless tobacco: Never Used  . Alcohol Use: No     Comment: rarely    Allergies  Allergen Reactions  . Celecoxib Other (See Comments)    TACHYCARDIA  . Codeine Other (See Comments)    TACHYCARIDIA  . Hydrocodone Other (See Comments)    TACHYCARDIA  . Tartrazine Other (See Comments)    Causes Tachycardia  . Aspirin     ONLY TARTRAZINE!!! Causes Tachycardia     Current Outpatient Prescriptions  Medication Sig Dispense Refill  . acetaminophen (TYLENOL) 500 MG tablet Take 1,000 mg by mouth every 6 (six) hours as needed for mild pain.    Marland Kitchen ADVAIR DISKUS 250-50 MCG/DOSE AEPB Inhale 1 puff into the lungs every morning.    Marland Kitchen albuterol (PROAIR HFA) 108 (90 BASE) MCG/ACT inhaler Inhale 2 puffs into the lungs every 6 (six) hours as needed. FOR SHORTNESS OF BREATH    . albuterol (PROVENTIL) (2.5 MG/3ML) 0.083% nebulizer solution Take 2.5 mg by nebulization daily as needed.    . ALPRAZolam (XANAX) 0.5 MG tablet Take 0.25-0.5 mg by mouth daily as needed. TAKE HALF TO ONE  A TABLET IF NEEDED FOR ANXIETY    . amLODipine (NORVASC) 10 MG tablet Take 10 mg by mouth every morning.     Marland Kitchen BREO ELLIPTA 100-25 MCG/INH AEPB Inhale 1 puff into the lungs daily.    Marland Kitchen CALCIUM-MAGNESUIUM-ZINC 333-133-8.3 MG TABS Take 1 tablet by mouth every morning.    . cetirizine (ZYRTEC) 10 MG tablet Take 5 mg by mouth 2 (two) times daily.     . diclofenac-misoprostol (ARTHROTEC 75) 75-0.2 MG per tablet Take 1 tablet by mouth 2 (two) times daily.      Marland Kitchen esomeprazole (NEXIUM) 40 MG capsule TAKE 1 CAPSULE (40 MG TOTAL)  BY MOUTH DAILY BEFORE BREAKFAST. 30 capsule 5  . fluticasone (FLONASE) 50 MCG/ACT nasal spray Place 1 spray into both nostrils daily as needed for allergies.     . fluticasone (FLOVENT HFA) 44 MCG/ACT inhaler Inhale 1 puff into the lungs 2 (two) times daily as needed (Shortness of Breath).     . levETIRAcetam (KEPPRA) 500 MG tablet Take 1 tablet (500 mg total) by mouth 2 (two) times daily. 180 tablet 3  . levothyroxine (SYNTHROID, LEVOTHROID) 88 MCG tablet Take 88 mcg by mouth daily before breakfast.    . losartan (COZAAR) 25 MG tablet Take 25 mg by mouth daily.     . metoprolol succinate (TOPROL-XL) 100 MG 24 hr tablet Take 100 mg by mouth daily. Take with or immediately following a meal.    . mometasone (NASONEX) 50 MCG/ACT nasal spray Place 2 sprays into the nose daily as  needed (Congestion).     . nitroGLYCERIN (NITROSTAT) 0.4 MG SL tablet Place 1 tablet (0.4 mg total) under the tongue every 5 (five) minutes as needed for chest pain. 25 tablet 3  . polyethylene glycol (MIRALAX / GLYCOLAX) packet Take 17 g by mouth daily as needed for mild constipation.     . Polyvinyl Alcohol-Povidone (MURINE TEARS FOR DRY EYES) 5-6 MG/ML SOLN Place 1  drop into both eyes every morning. As needed    . prasugrel (EFFIENT) 5 MG TABS tablet Take 1 tablet (5 mg total) by mouth daily. 30 tablet 6  . traMADol (ULTRAM) 50 MG tablet Take 1 tablet (50 mg total) by mouth every 6 (six) hours as needed. 60 tablet 3   No current facility-administered medications for this visit.     Physical Exam  Blood pressure 134/81, pulse 76, height 5\' 1"  (1.549 m), weight 149 lb (67.586 kg).  Constitutional: overall normal hygiene, normal nutrition, well developed, normal grooming, normal body habitus. Assistive device:cane  Musculoskeletal: gait and station Limp none, muscle tone and strength are normal, no tremors or atrophy is present.  .  Neurological: coordination overall normal.  Deep tendon reflex/nerve stretch intact.  Sensation normal.  Cranial nerves II-XII intact.   Skin:   normal overall no scars, lesions, ulcers or rashes. No psoriasis.  Psychiatric: Alert and oriented x 3.  Recent memory intact, remote memory unclear.  Normal mood and affect. Well groomed.  Good eye contact.  Cardiovascular: overall no swelling, no varicosities, no edema bilaterally, normal temperatures of the legs and arms, no clubbing, cyanosis and good capillary refill.  Lymphatic: palpation is normal.  Spine/Pelvis examination:  Inspection:  Overall, sacoiliac joint benign and hips nontender; without crepitus or defects.   Thoracic spine inspection: Alignment normal without kyphosis present   Lumbar spine inspection:  Alignment  with normal lumbar lordosis, without scoliosis apparent.   Thoracic spine  palpation:  without tenderness of spinal processes   Lumbar spine palpation: with tenderness of lumbar area; without tightness of lumbar muscles    Range of Motion:   Lumbar flexion, forward flexion is 35  without pain or tenderness    Lumbar extension is 10  without pain or tenderness   Left lateral bend is Normal  without pain or tenderness   Right lateral bend is Normal without pain or tenderness   Straight leg raising is Normal   Strength & tone: Normal   Stability overall normal stability     The patient has been educated about the nature of the problem(s) and counseled on treatment options.  The patient appeared to understand what I have discussed and is in agreement with it.  Encounter Diagnoses  Name Primary?  . Midline low back pain without sciatica Yes  . Coronary artery disease involving native coronary artery of native heart without angina pectoris   . Asthmatic bronchitis, mild intermittent, uncomplicated     PLAN Call if any problems.  Precautions discussed.  Continue current medications.   Return to clinic 2 months   Electronically Signed Sanjuana Kava, MD 6/20/201711:47 AM

## 2016-01-13 ENCOUNTER — Encounter (HOSPITAL_COMMUNITY): Payer: Self-pay | Admitting: *Deleted

## 2016-01-13 ENCOUNTER — Other Ambulatory Visit: Payer: Self-pay

## 2016-01-13 ENCOUNTER — Emergency Department (HOSPITAL_COMMUNITY): Payer: Medicare Other

## 2016-01-13 ENCOUNTER — Emergency Department (HOSPITAL_COMMUNITY)
Admission: EM | Admit: 2016-01-13 | Discharge: 2016-01-13 | Disposition: A | Discharge status: Home / Self Care | Payer: Medicare Other | Attending: Emergency Medicine | Admitting: Emergency Medicine

## 2016-01-13 DIAGNOSIS — J45909 Unspecified asthma, uncomplicated: Secondary | ICD-10-CM | POA: Insufficient documentation

## 2016-01-13 DIAGNOSIS — E039 Hypothyroidism, unspecified: Secondary | ICD-10-CM | POA: Insufficient documentation

## 2016-01-13 DIAGNOSIS — R0789 Other chest pain: Secondary | ICD-10-CM | POA: Diagnosis not present

## 2016-01-13 DIAGNOSIS — R109 Unspecified abdominal pain: Secondary | ICD-10-CM | POA: Diagnosis not present

## 2016-01-13 DIAGNOSIS — E119 Type 2 diabetes mellitus without complications: Secondary | ICD-10-CM | POA: Diagnosis not present

## 2016-01-13 DIAGNOSIS — I2510 Atherosclerotic heart disease of native coronary artery without angina pectoris: Secondary | ICD-10-CM | POA: Diagnosis not present

## 2016-01-13 DIAGNOSIS — I10 Essential (primary) hypertension: Secondary | ICD-10-CM | POA: Diagnosis not present

## 2016-01-13 DIAGNOSIS — R112 Nausea with vomiting, unspecified: Secondary | ICD-10-CM | POA: Diagnosis not present

## 2016-01-13 DIAGNOSIS — Z6828 Body mass index (BMI) 28.0-28.9, adult: Secondary | ICD-10-CM | POA: Diagnosis not present

## 2016-01-13 DIAGNOSIS — Z79899 Other long term (current) drug therapy: Secondary | ICD-10-CM | POA: Insufficient documentation

## 2016-01-13 DIAGNOSIS — E669 Obesity, unspecified: Secondary | ICD-10-CM | POA: Diagnosis not present

## 2016-01-13 LAB — COMPREHENSIVE METABOLIC PANEL
ALK PHOS: 103 U/L (ref 38–126)
ALT: 14 U/L (ref 14–54)
AST: 15 U/L (ref 15–41)
Albumin: 3 g/dL — ABNORMAL LOW (ref 3.5–5.0)
Anion gap: 6 (ref 5–15)
BUN: 24 mg/dL — ABNORMAL HIGH (ref 6–20)
CHLORIDE: 98 mmol/L — AB (ref 101–111)
CO2: 29 mmol/L (ref 22–32)
CREATININE: 0.96 mg/dL (ref 0.44–1.00)
Calcium: 9.1 mg/dL (ref 8.9–10.3)
GFR calc non Af Amer: 54 mL/min — ABNORMAL LOW (ref >60)
Glucose, Bld: 120 mg/dL — ABNORMAL HIGH (ref 65–99)
Potassium: 4 mmol/L (ref 3.5–5.1)
Sodium: 133 mmol/L — ABNORMAL LOW (ref 135–145)
Total Bilirubin: 0.4 mg/dL (ref 0.3–1.2)
Total Protein: 8.4 g/dL — ABNORMAL HIGH (ref 6.5–8.1)

## 2016-01-13 LAB — URINALYSIS, ROUTINE W REFLEX MICROSCOPIC
Bilirubin Urine: NEGATIVE
GLUCOSE, UA: NEGATIVE mg/dL
Ketones, ur: NEGATIVE mg/dL
Leukocytes, UA: NEGATIVE
Nitrite: NEGATIVE
PH: 5.5 (ref 5.0–8.0)
Protein, ur: NEGATIVE mg/dL
SPECIFIC GRAVITY, URINE: 1.015 (ref 1.005–1.030)

## 2016-01-13 LAB — CBC
HCT: 37.6 % (ref 36.0–46.0)
HEMOGLOBIN: 12.4 g/dL (ref 12.0–15.0)
MCH: 29 pg (ref 26.0–34.0)
MCHC: 33 g/dL (ref 30.0–36.0)
MCV: 88.1 fL (ref 78.0–100.0)
Platelets: 439 10*3/uL — ABNORMAL HIGH (ref 150–400)
RBC: 4.27 MIL/uL (ref 3.87–5.11)
RDW: 12.8 % (ref 11.5–15.5)
WBC: 12.2 10*3/uL — ABNORMAL HIGH (ref 4.0–10.5)

## 2016-01-13 LAB — TROPONIN I: Troponin I: <0.03 ng/mL (ref <0.031)

## 2016-01-13 LAB — URINE MICROSCOPIC-ADD ON

## 2016-01-13 LAB — LIPASE, BLOOD: LIPASE: 19 U/L (ref 11–51)

## 2016-01-13 MED ORDER — GI COCKTAIL ~~LOC~~
30.0000 mL | Freq: Once | ORAL | Status: AC
  Administered 2016-01-13: 30 mL via ORAL
  Filled 2016-01-13: qty 30

## 2016-01-13 MED ORDER — ACETAMINOPHEN 325 MG PO TABS
650.0000 mg | ORAL_TABLET | Freq: Once | ORAL | Status: AC
  Administered 2016-01-13: 650 mg via ORAL
  Filled 2016-01-13: qty 2

## 2016-01-13 NOTE — ED Notes (Signed)
Pt states she was seen here last week for a UTI. Since then pat has had increased pain in her abdomen on the right side and had one episode of vomiting this morning at 0500.

## 2016-01-13 NOTE — ED Notes (Signed)
Pt taken to bathroom in wheelchair by 2 nurses.

## 2016-01-13 NOTE — ED Notes (Signed)
Pt ambulated to restroom with one assist, no difficulty with ambulation.

## 2016-01-13 NOTE — Discharge Instructions (Signed)

## 2016-01-13 NOTE — ED Provider Notes (Signed)
History  By signing my name below, I, Bea Graff, attest that this documentation has been prepared under the direction and in the presence of Alyse Low, [place]. Electronically Signed: Bea Graff, ED Scribe. 01/13/2016. 8:45 AM.   Chief Complaint  Patient presents with  . Abdominal Pain   The history is provided by the patient and medical records. No language interpreter was used.    HPI Comments:  Katie Ford is a 82 y.o. female who presents to the Emergency Department complaining of right sided chest soreness that began yesterday. Pt was seen here about two weeks ago and was prescribed a 7 day course of Septra for a UTI that she has now completed. She reports associated nausea and one episode of emesis about 3.5 hours ago. She has not done anything to treat her symptoms. She denies modifying factors. She denies diarrhea, fever, chills, abdominal pain, cough. She denies h/o DM. She reports PMHx of CAD and previous stent placement.  Past Medical History  Diagnosis Date  . Ejection fraction     EF 65 %... normal... catheterization 02/2010.  . Mitral regurgitation     mild, echo, June, 2007  . Paroxysmal supraventricular tachycardia (HCC)     infrequent episodes over the years..palpitations.  . Hypertension   . Asthmatic bronchitis   . Obesity   . CAD (coronary artery disease)     bare metal stents to 2 separate RCA lesions August, 2011, residual 60% LAD, medical therapy  /  plan was for effient for one month followed by Plavix,, but patient does not tolerate Plavix... therefore patient placed back on effient  . GERD (gastroesophageal reflux disease)   . Edema 03/29/2010    September, 2011  . Intolerance of drug     aspirin and plavix  . Hypothyroidism 04/24/2011  . Hyperglycemia 04/24/2011  . Fracture of rib of left side 04/24/2011  . Hip fracture [facility])     October, 2012  . Diverticulitis     remote hx  . Diabetes mellitus     does not have  . Carotid artery  disease [facility])    Past Surgical History  Procedure Laterality Date  . Knee arthroscopy  11/2001    left knee  . Laparoscopic cholecystectomy  04/29/1996    [facility] hospital  . Tubal ligation  09/1970  . [place]le fracture [place]  12/[place]    [place]  . Sp arthro thumb*r*  05/1989    [place]  . Total abdominal hysterectomy  1983    [place]  . Cardiac catheterization  02/22/2010    2 stents, [place]  . Knee arthroscopy  02/19/2011    Procedure: ARTHROSCOPY KNEE;  Surgeon: Sanjuana Kava;  Location: [facility];  Service: Orthopedics;  Laterality: Right;  Medial and Lateral Partial Menisectomy  . Arthroplasty w/ arthroscopy medial / lateral compartment knee      [facility], should read arthroscopy not arthroplasty  . Orif hip fracture  04/25/2011    Procedure: OPEN REDUCTION INTERNAL FIXATION HIP;  Surgeon: Sanjuana Kava;  Location: [facility];  Service: Orthopedics;  Laterality: Left;  . Ileocolonscopy  12/06/11    fields:diverticula throughtout the colon/inertnal hemorrhoids/  . Esophagogastroduodenoscopy  12/06/11    fields: Negative H. pylori, hiatal hernia/moderate gastritis   Family History  Problem Relation Age of Onset  . Diabetes Mother   . Arthritis Mother   . Heart attack Father   . Stroke Father   . Emphysema Father   . Cataracts Father   . Cancer Sister   .  Heart disease Sister   . Arthritis Sister   . Arthritis Brother   . Allergies Brother   . Colon cancer Neg Hx    Social History  Substance Use Topics  . Smoking status: Never Smoker   . Smokeless tobacco: Never Used  . Alcohol Use: No     Comment: rarely   OB History    Gravida Para Term Preterm AB TAB SAB Ectopic Multiple Living   6 6 6             Review of Systems  Cardiovascular: Positive for chest pain (soreness).  Gastrointestinal: Positive for nausea and vomiting.  All other systems reviewed and are negative.   Allergies  Celecoxib; Codeine; Hydrocodone; Tartrazine; and Aspirin  Home Medications   Prior to Admission medications    Medication Sig Start Date End Date Taking? Authorizing Provider  acetaminophen (TYLENOL) 500 MG tablet Take 1,000 mg by mouth every 6 (six) hours as needed for mild pain.    Historical Provider, MD  ADVAIR DISKUS 250-50 MCG/DOSE AEPB Inhale 1 puff into the lungs every morning. 04/06/13   Historical Provider, MD  albuterol (PROAIR HFA) 108 (90 BASE) MCG/ACT inhaler Inhale 2 puffs into the lungs every 6 (six) hours as needed. FOR SHORTNESS OF BREATH    Historical Provider, MD  albuterol (PROVENTIL) (2.5 MG/3ML) 0.083% nebulizer solution Take 2.5 mg by nebulization daily as needed. 12/12/15   Historical Provider, MD  ALPRAZolam Duanne Moron) 0.5 MG tablet Take 0.25-0.5 mg by mouth daily as needed. TAKE HALF TO ONE  A TABLET IF NEEDED FOR ANXIETY    Historical Provider, MD  amLODipine (NORVASC) 10 MG tablet Take 10 mg by mouth every morning.     Historical Provider, MD  BREO ELLIPTA 100-25 MCG/INH AEPB Inhale 1 puff into the lungs daily. 12/04/15   Historical Provider, MD  CALCIUM-MAGNESUIUM-ZINC 333-133-8.3 MG TABS Take 1 tablet by mouth every morning.    Historical Provider, MD  cetirizine (ZYRTEC) 10 MG tablet Take 5 mg by mouth 2 (two) times daily.     Historical Provider, MD  diclofenac-misoprostol (ARTHROTEC 75) 75-0.2 MG per tablet Take 1 tablet by mouth 2 (two) times daily.      Historical Provider, MD  esomeprazole (NEXIUM) 40 MG capsule TAKE 1 CAPSULE (40 MG TOTAL)  BY MOUTH DAILY BEFORE BREAKFAST. 01/24/14   Danie Binder, MD  fluticasone (FLONASE) 50 MCG/ACT nasal spray Place 1 spray into both nostrils daily as needed for allergies.  06/25/13   Historical Provider, MD  fluticasone (FLOVENT HFA) 44 MCG/ACT inhaler Inhale 1 puff into the lungs 2 (two) times daily as needed (Shortness of Breath).     Historical Provider, MD  levETIRAcetam (KEPPRA) 500 MG tablet Take 1 tablet (500 mg total) by mouth 2 (two) times daily. 09/11/15   Marcial Pacas, MD  levothyroxine (SYNTHROID, LEVOTHROID) 88 MCG tablet Take 88  mcg by mouth daily before breakfast.    Historical Provider, MD  losartan (COZAAR) 25 MG tablet Take 25 mg by mouth daily.  08/26/13   Historical Provider, MD  metoprolol succinate (TOPROL-XL) 100 MG 24 hr tablet Take 100 mg by mouth daily. Take with or immediately following a meal.    Historical Provider, MD  mometasone (NASONEX) 50 MCG/ACT nasal spray Place 2 sprays into the nose daily as needed (Congestion).     Historical Provider, MD  nitroGLYCERIN (NITROSTAT) 0.4 MG SL tablet Place 1 tablet (0.4 mg total) under the tongue every 5 (five) minutes as needed for  chest pain. 12/26/15   Herminio Commons, MD  polyethylene glycol (MIRALAX / GLYCOLAX) packet Take 17 g by mouth daily as needed for mild constipation.     Historical Provider, MD  Polyvinyl Alcohol-Povidone (MURINE TEARS FOR DRY EYES) 5-6 MG/ML SOLN Place 1 drop into both eyes every morning. As needed    Historical Provider, MD  prasugrel (EFFIENT) 5 MG TABS tablet Take 1 tablet (5 mg total) by mouth daily. 03/09/15   Carlena Bjornstad, MD  traMADol (ULTRAM) 50 MG tablet Take 1 tablet (50 mg total) by mouth every 6 (six) hours as needed. 01/09/16   Sanjuana Kava, MD   Triage Vitals: BP 180/85 mmHg  Pulse 73  Temp(Src) 97.8 F (36.6 C) (Oral)  Resp 20  Ht 5\' 1"  (1.549 m)  Wt 150 lb (68.04 kg)  BMI 28.36 kg/m2  SpO2 98% Physical Exam  Constitutional: She is oriented to person, place, and time. She appears well-developed and well-nourished.  HENT:  Head: Normocephalic and atraumatic.  Eyes: EOM are normal.  Neck: Normal range of motion.  Cardiovascular: Normal rate.   Pulmonary/Chest: Effort normal. She exhibits tenderness (tenderness to palpation of right chest).  Musculoskeletal: Normal range of motion.  Neurological: She is alert and oriented to person, place, and time.  Skin: Skin is warm and dry.  Psychiatric: She has a normal mood and affect. Her behavior is normal.  Nursing note and vitals reviewed.   ED Course  Procedures  (including critical care time) DIAGNOSTIC STUDIES: Oxygen Saturation is 98% on RA, normal by my interpretation.   COORDINATION OF CARE: 8:36 AM- Will order labs, CXR and urinalysis. Pt verbalizes understanding and agrees to plan.  Medications - No data to display  Labs Review Labs Reviewed  CBC - Abnormal; Notable for the following:    WBC 12.2 (*)    Platelets 439 (*)    All other components within normal limits  LIPASE, BLOOD  COMPREHENSIVE METABOLIC PANEL  URINALYSIS, ROUTINE W REFLEX MICROSCOPIC (NOT AT [facility])    Imaging Review No results found. I have personally reviewed and evaluated these images and lab results as part of my medical decision-making.   EKG Interpretation   Date/Time:  Saturday January 13 2016 09:05:46 EDT Ventricular Rate:  70 PR Interval:    QRS Duration: 110 QT Interval:  424 QTC Calculation: 458 R Axis:   -15 Text Interpretation:  Sinus rhythm Atrial premature complexes Borderline  left axis deviation No significant change since last tracing Confirmed by  Christy Gentles  MD, [place] ([id number]) on 01/13/2016 9:31:44 AM      MDM Chest xray is normal,  Labs negative lipase,  No urinary tract infection.   I will try giving pt a Gi cocktail.  Pt is advised to follow up with Dr. Edrick Oh for recheck on Monday   Final diagnoses:  Chest wall pain   An After Visit Summary was printed and given to the patient.       Hollace Kinnier Aldan, PA-C 01/13/16 1042  Ripley Fraise, MD 01/13/16 1052

## 2016-03-12 ENCOUNTER — Ambulatory Visit: Payer: Medicare Other | Admitting: Orthopaedic Surgery

## 2016-03-13 ENCOUNTER — Telehealth: Payer: Self-pay | Admitting: Orthopaedic Surgery

## 2016-03-28 ENCOUNTER — Encounter: Payer: Self-pay | Admitting: Orthopaedic Surgery

## 2016-03-28 ENCOUNTER — Ambulatory Visit (INDEPENDENT_AMBULATORY_CARE_PROVIDER_SITE_OTHER): Payer: Medicare Other | Admitting: Orthopaedic Surgery

## 2016-03-28 VITALS — BP 143/84 | HR 76 | Temp 97.7°F | Ht <= 58 in | Wt 147.0 lb

## 2016-03-28 DIAGNOSIS — M5442 Lumbago with sciatica, left side: Secondary | ICD-10-CM | POA: Diagnosis not present

## 2016-03-28 MED ORDER — TRAMADOL HCL 50 MG PO TABS: 50.0000 mg | ORAL_TABLET | Freq: Four times a day (QID) | ORAL | 1 refills | Status: DC | PRN

## 2016-03-28 NOTE — Progress Notes (Signed)
Patient Katie Ford, female DOB:1931/08/17, 82 y.o. [name]  Chief Complaint  Patient presents with  . Follow-up    c/o left hip pain    HPI  Katie Ford is a 82 y.o. female who has lower back pain now with left sided sciatica.  She has pain to the left foot.  It runs down her leg on the left.  She is not improved with rest, heat, ice and rubs and her medicine.  I will begin PT for her.  She is willing to go.  She has no new trauma, no bowel or bladder problems. HPI  Body mass index is 31.26 kg/m.  ROS  Review of Systems  Constitutional:       Patient does not have Diabetes Mellitus. Patient does not have hypertension. Patient has COPD or shortness of breath. Patient has BMI > 35. Patient does not have current smoking history.   HENT: Negative.   Respiratory: Positive for shortness of breath.   Cardiovascular: Positive for palpitations and leg swelling.  Musculoskeletal: Positive for arthralgias, back pain and myalgias.  Allergic/Immunologic: Negative.   Hematological: Negative.   Psychiatric/Behavioral: Negative.     Past Medical History:  Diagnosis Date  . Asthmatic bronchitis   . CAD (coronary artery disease)    bare metal stents to 2 separate RCA lesions August, 2011, residual 60% LAD, medical therapy  /  plan was for effient for one month followed by Plavix,, but patient does not tolerate Plavix... therefore patient placed back on effient  . Carotid artery disease ([place])   . Diabetes mellitus    does not have  . Diverticulitis    remote hx  . Edema 03/29/2010   September, 2011  . Ejection fraction    EF 65 %... normal... catheterization 02/2010.  . Fracture of rib of left side 04/24/2011  . GERD (gastroesophageal reflux disease)   . Hip fracture [facility])    October, 2012  . Hyperglycemia 04/24/2011  . Hypertension   . Hypothyroidism 04/24/2011  . Intolerance of drug    aspirin and plavix  . Mitral regurgitation    mild, echo, June, 2007  .  Obesity   . Paroxysmal supraventricular tachycardia (HCC)    infrequent episodes over the years..palpitations.    Past Surgical History:  Procedure Laterality Date  . ANKLE FRACTURE SURGERY  12/1993   [place]  . ARTHROPLASTY W/ ARTHROSCOPY MEDIAL / LATERAL COMPARTMENT [facility]NEE     [facility], should read arthroscopy not arthroplasty  . CARDIAC CATHETERIZATION  02/22/2010   2 stents, [place]  . ESOPHAGOGASTRODUODENOSCOPY  12/06/11   fields: Negative H. pylori, hiatal hernia/moderate gastritis  . ileocolonscopy  12/06/11   fields:diverticula throughtout the colon/inertnal hemorrhoids/  . KNEE ARTHROSCOPY  11/2001   left knee  . KNEE ARTHROSCOPY  02/19/2011   Procedure: ARTHROSCOPY KNEE;  Surgeon: Sanjuana Kava;  Location: [facility];  Service: Orthopedics;  Laterality: Right;  Medial and Lateral Partial Menisectomy  . LAPAROSCOPIC CHOLECYSTECTOMY  04/29/1996   [facility] [facility]  . ORIF HIP FRACTURE  04/25/2011   Procedure: OPEN REDUCTION INTERNAL FIXATION HIP;  Surgeon: Sanjuana Kava;  Location: [facility];  Service: Orthopedics;  Laterality: Left;  . SP ARTHRO THUMB*R*  05/1989   [place]  . TOTAL ABDOMINAL HYSTERECTOMY  1983   [place]  . TUBAL LIGATION  09/1970    Family History  Problem Relation Age of Onset  . Diabetes Mother   . Arthritis Mother   . Heart attack Father   . Stroke Father   .  Emphysema Father   . Cataracts Father   . Cancer Sister   . Heart disease Sister   . Arthritis Sister   . Arthritis Brother   . Allergies Brother   . Colon cancer Neg Hx     Social History Social History  Substance Use Topics  . Smoking status: Never Smoker  . Smokeless tobacco: Never Used  . Alcohol use No     Comment: rarely    Allergies  Allergen Reactions  . Celecoxib Other (See Comments)    TACHYCARDIA  . Codeine Other (See Comments)    TACHYCARIDIA  . Hydrocodone Other (See Comments)    TACHYCARDIA  . Tartrazine Other (See Comments)    Causes Tachycardia  . Aspirin     ONLY TARTRAZINE!!! Causes  Tachycardia    Current Outpatient Prescriptions  Medication Sig Dispense Refill  . acetaminophen (TYLENOL) 500 MG tablet Take 1,000 mg by mouth every 6 (six) hours as needed for mild pain.    Marland Kitchen ADVAIR DISKUS 250-50 MCG/DOSE AEPB Inhale 1 puff into the lungs every morning.    Marland Kitchen albuterol (PROAIR HFA) 108 (90 BASE) MCG/ACT inhaler Inhale 2 puffs into the lungs every 6 (six) hours as needed. FOR SHORTNESS OF BREATH    . albuterol (PROVENTIL) (2.5 MG/3ML) 0.083% nebulizer solution Take 2.5 mg by nebulization daily as needed.    . ALPRAZolam (XANAX) 0.5 MG tablet Take 0.25-0.5 mg by mouth daily as needed. TAKE HALF TO ONE  A TABLET IF NEEDED FOR ANXIETY    . amLODipine (NORVASC) 10 MG tablet Take 10 mg by mouth every morning.     Marland Kitchen BREO ELLIPTA 100-25 MCG/INH AEPB Inhale 1 puff into the lungs daily.    Marland Kitchen CALCIUM-MAGNESUIUM-ZINC 333-133-8.3 MG TABS Take 1 tablet by mouth every morning.    . cetirizine (ZYRTEC) 10 MG tablet Take 5 mg by mouth 2 (two) times daily.     . diclofenac-misoprostol (ARTHROTEC 75) 75-0.2 MG per tablet Take 1 tablet by mouth 2 (two) times daily.      Marland Kitchen esomeprazole (NEXIUM) 40 MG capsule TAKE 1 CAPSULE (40 MG TOTAL)  BY MOUTH DAILY BEFORE BREAKFAST. 30 capsule 5  . fluticasone (FLONASE) 50 MCG/ACT nasal spray Place 1 spray into both nostrils daily as needed for allergies.     . fluticasone (FLOVENT HFA) 44 MCG/ACT inhaler Inhale 1 puff into the lungs 2 (two) times daily as needed (Shortness of Breath).     . levETIRAcetam (KEPPRA) 500 MG tablet Take 1 tablet (500 mg total) by mouth 2 (two) times daily. 180 tablet 3  . levothyroxine (SYNTHROID, LEVOTHROID) 88 MCG tablet Take 88 mcg by mouth daily before breakfast.    . losartan (COZAAR) 25 MG tablet Take 25 mg by mouth daily.     . metoprolol succinate (TOPROL-XL) 100 MG 24 hr tablet Take 100 mg by mouth daily. Take with or immediately following a meal.    . mometasone (NASONEX) 50 MCG/ACT nasal spray Place 2 sprays into the  nose daily as needed (Congestion).     . nitroGLYCERIN (NITROSTAT) 0.4 MG SL tablet Place 1 tablet (0.4 mg total) under the tongue every 5 (five) minutes as needed for chest pain. 25 tablet 3  . polyethylene glycol (MIRALAX / GLYCOLAX) packet Take 17 g by mouth daily as needed for mild constipation.     . Polyvinyl Alcohol-Povidone (MURINE TEARS FOR DRY EYES) 5-6 MG/ML SOLN Place 1 drop into both eyes every morning. As needed    .  prasugrel (EFFIENT) 5 MG TABS tablet Take 1 tablet (5 mg total) by mouth daily. 30 tablet 6  . traMADol (ULTRAM) 50 MG tablet Take 1 tablet (50 mg total) by mouth every 6 (six) hours as needed. for pain 60 tablet 1   No current facility-administered medications for this visit.      Physical Exam  Blood pressure (!) 143/84, pulse 76, temperature 97.7 F (36.5 C), height 4' 9.5" (1.461 m), weight 147 lb (66.7 kg).  Constitutional: overall normal hygiene, normal nutrition, well developed, normal grooming, normal body habitus. Assistive device:none  Musculoskeletal: gait and station Limp left, muscle tone and strength are normal, no tremors or atrophy is present.  .  Neurological: coordination overall normal.  Deep tendon reflex/nerve stretch intact.  Sensation normal.  Cranial nerves II-XII intact.   Skin:   Normal overall no scars, lesions, ulcers or rashes. No psoriasis.  Psychiatric: Alert and oriented x 3.  Recent memory intact, remote memory unclear.  Normal mood and affect. Well groomed.  Good eye contact.  Cardiovascular: overall no swelling, no varicosities, no edema bilaterally, normal temperatures of the legs and arms, no clubbing, cyanosis and good capillary refill.  Lymphatic: palpation is normal.  Spine/Pelvis examination:  Inspection:  Overall, sacoiliac joint benign and hips nontender; without crepitus or defects.   Thoracic spine inspection: Alignment normal without kyphosis present   Lumbar spine inspection:  Alignment  with normal lumbar  lordosis, without scoliosis apparent.   Thoracic spine palpation:  without tenderness of spinal processes   Lumbar spine palpation: with tenderness of lumbar area; without tightness of lumbar muscles    Range of Motion:   Lumbar flexion, forward flexion is 40 without pain or tenderness    Lumbar extension is 5 without pain or tenderness   Left lateral bend is Normal  without pain or tenderness   Right lateral bend is Normal without pain or tenderness   Straight leg raising is Normal   Strength & tone: Normal   Stability overall normal stability     The patient has been educated about the nature of the problem(s) and counseled on treatment options.  The patient appeared to understand what I have discussed and is in agreement with it.  Encounter Diagnosis  Name Primary?  . Left-sided low back pain with left-sided sciatica Yes    PLAN Call if any problems.  Precautions discussed.  Continue current medications.   Return to clinic two weeks, go to PT   Electronically Hitchcock, MD 9/7/201711:11 AM

## 2016-03-28 NOTE — Patient Instructions (Signed)
Call [facility] therapy dept to schedule therapy [phone]

## 2016-04-02 ENCOUNTER — Telehealth (HOSPITAL_COMMUNITY): Payer: Self-pay

## 2016-04-02 ENCOUNTER — Ambulatory Visit (HOSPITAL_COMMUNITY): Payer: Medicare Other

## 2016-04-02 NOTE — Telephone Encounter (Signed)
She is going to see the MD she is sick today with sinus

## 2016-04-08 ENCOUNTER — Telehealth (HOSPITAL_COMMUNITY): Payer: Self-pay

## 2016-04-08 NOTE — Telephone Encounter (Signed)
04/08/16 husband called to cancel.... He said that she was very sick and they are trying to keep her out of the hosptial.  He will call back when he thinks she is ready to schedule.

## 2016-04-09 ENCOUNTER — Ambulatory Visit (HOSPITAL_COMMUNITY): Payer: Medicare Other | Admitting: Physical Therapy

## 2016-04-25 ENCOUNTER — Other Ambulatory Visit: Payer: Self-pay | Admitting: Orthopaedic Surgery

## 2016-04-30 ENCOUNTER — Ambulatory Visit (HOSPITAL_COMMUNITY): Payer: Medicare Other

## 2016-05-01 ENCOUNTER — Ambulatory Visit (HOSPITAL_COMMUNITY): Payer: Medicare Other | Attending: Orthopaedic Surgery

## 2016-05-01 DIAGNOSIS — M545 Low back pain: Secondary | ICD-10-CM | POA: Insufficient documentation

## 2016-05-01 DIAGNOSIS — G8929 Other chronic pain: Secondary | ICD-10-CM | POA: Diagnosis present

## 2016-05-01 DIAGNOSIS — R2681 Unsteadiness on feet: Secondary | ICD-10-CM | POA: Diagnosis present

## 2016-05-01 DIAGNOSIS — R293 Abnormal posture: Secondary | ICD-10-CM | POA: Diagnosis present

## 2016-05-01 DIAGNOSIS — M25552 Pain in left hip: Secondary | ICD-10-CM | POA: Diagnosis present

## 2016-05-01 NOTE — Patient Instructions (Signed)
HEP deferred at this time until able to obtain level of assistance family will be able to provide with HEP.

## 2016-05-01 NOTE — Therapy (Signed)
[place], [place], [address] Phone: [phone]   Fax:  [phone]  Physical Therapy Evaluation  Patient Details  Name: Katie Ford MRN: [id number] Date of Birth: 1931-09-19 Referring Provider: Sanjuana Kava   Encounter Date: May 05, 2016      PT End of Session - 05-May-2016 1615    Visit Number 1   Number of Visits 16   Date for PT Re-Evaluation 06/01/16   Authorization Type [employer]    Authorization Time Period 05/05/16-07-05-16 (G-codes done on May 05, 2016)   Authorization - Visit Number 1   Authorization - Number of Visits 10   PT Start Time 1307   PT Stop Time 1343   PT Time Calculation (min) 36 min   Equipment Utilized During Treatment Gait belt   Activity Tolerance Patient tolerated treatment well;Patient limited by fatigue   Behavior During Therapy [facility] for tasks assessed/performed;Impulsive      Past Medical History:  Diagnosis Date  . Asthmatic bronchitis   . CAD (coronary artery disease)    bare metal stents to 2 separate RCA lesions August, 2011, residual 60% LAD, medical therapy  /  plan was for effient for one month followed by Plavix,, but patient does not tolerate Plavix... therefore patient placed back on effient  . Carotid artery disease ([place])   . Diabetes mellitus    does not have  . Diverticulitis    remote hx  . Edema 03/29/2010   September, 2011  . Ejection fraction    EF 65 %... normal... catheterization 02/2010.  . Fracture of rib of left side 04/24/2011  . GERD (gastroesophageal reflux disease)   . Hip fracture [facility])    October, 2012  . Hyperglycemia 04/24/2011  . Hypertension   . Hypothyroidism 04/24/2011  . Intolerance of drug    aspirin and plavix  . Mitral regurgitation    mild, echo, June, 2007  . Obesity   . Paroxysmal supraventricular tachycardia (HCC)    infrequent episodes over the years..palpitations.    Past Surgical History:  Procedure Laterality Date  .  ANKLE FRACTURE SURGERY  12/1993   [place]  . ARTHROPLASTY W/ ARTHROSCOPY MEDIAL / LATERAL COMPARTMENT [facility]NEE     [facility], should read arthroscopy not arthroplasty  . CARDIAC CATHETERIZATION  02/22/2010   2 stents, [place]  . ESOPHAGOGASTRODUODENOSCOPY  12/06/11   fields: Negative H. pylori, hiatal hernia/moderate gastritis  . ileocolonscopy  12/06/11   fields:diverticula throughtout the colon/inertnal hemorrhoids/  . KNEE ARTHROSCOPY  11/2001   left knee  . KNEE ARTHROSCOPY  02/19/2011   Procedure: ARTHROSCOPY KNEE;  Surgeon: Sanjuana Kava;  Location: [facility];  Service: Orthopedics;  Laterality: Right;  Medial and Lateral Partial Menisectomy  . LAPAROSCOPIC CHOLECYSTECTOMY  04/29/1996   [facility]  . ORIF HIP FRACTURE  04/25/2011   Procedure: OPEN REDUCTION INTERNAL FIXATION HIP;  Surgeon: Sanjuana Kava;  Location: [facility];  Service: Orthopedics;  Laterality: Left;  . SP ARTHRO THUMB*R*  05/1989   [place]  . TOTAL ABDOMINAL HYSTERECTOMY  1983   [place]  . TUBAL LIGATION  09/1970    There were no vitals filed for this visit.       Subjective Assessment - May 05, 2016 1313    Subjective Pt reports she started to have Left sided low back pain and L anterior groin pain, worse at night. At night when she gets up to go to the bathroom, her pain keeps her from returning to sleep and so she elevates  her left leg onto two pillows to help with pain (in supine). (Pt's reliability as a historian is somewhat limited due to some short term/long term memory impairment).    Pertinent History history of hip Fx s/p ORIF. Some memory problems at baseline.    Limitations Sitting   How long can you sit comfortably? 2 hours   How long can you stand comfortably? Pt unsure.   How long can you walk comfortably? Pt unsure, likely an hour.    Diagnostic tests MRI spine.    Patient Stated Goals Reduce pain in back and left leg.    Currently in Pain? Yes   Pain Score 10-Worst pain ever   Pain Location Back   Pain Orientation Left    Pain Type Chronic pain   Pain Onset More than a month ago   Pain Frequency Intermittent   Aggravating Factors  sitting, walking, postiions in bed   Pain Relieving Factors changing position.    Multiple Pain Sites --  Left anterior hip, left anterior thigh.            [facility] PT Assessment - 05/01/16 0001      Assessment   Medical Diagnosis Left Low Back Pain    Referring Provider Sanjuana Kava    Onset Date/Surgical Date --  ~ 1 year ago estimated   Prior Therapy None     Precautions   Precautions None     Balance Screen   Has the patient fallen in the past 6 months No   Has the patient had a decrease in activity level because of a fear of falling?  Yes   Is the patient reluctant to leave their home because of a fear of falling?  No     Home Environment   Additional Comments lives with Son, lots of additional familiy support   Family assists with IADL.      Prior Function   Level of Independence Independent with household mobility with device;Independent with basic ADLs     Cognition   Overall Cognitive Status No family/caregiver present to determine baseline cognitive functioning   Memory Impaired   Memory Impairment Storage deficit;Retrieval deficit  several instances of repeated conversation/questioning.      Sensation   Light Touch Appears Intact  Tested in BLE     ROM / Strength   AROM / PROM / Strength Strength;PROM;AROM     AROM   AROM Assessment Site Hip   Right Hip Extension -38  tested in standing; lacks 38 degrees from neutral   Left Hip Extension -25  tested in standing; lacks 25 degrees from neural     PROM   PROM Assessment Site Hip;Knee;Lumbar;Thoracic;Shoulder   Right/Left Shoulder Right;Left   Right Shoulder Flexion 110 Degrees  seated AAROM   Left Shoulder Flexion 134 Degrees  seated AAROM   Right/Left Hip Right;Left   Right Hip External Rotation  --  Painful upon testing at the greater trochanter   Left Hip Extension -10  lacks 10  degrees from neutral   Right/Left Knee Right;Left   Right Knee Extension 15   Left Knee Extension 8   Lumbar Flexion lacks 5 degrees from neutral   mildly maintained lordosis during forward flexion.   Lumbar Extension WNL     Strength   Strength Assessment Site Hip;Knee;Ankle   Right/Left Hip Right;Left   Right Hip Flexion 5/5   Right Hip Extension 5/5  standing, range very limited   Right Hip External Rotation  5/5  Right Hip Internal Rotation 5/5   Right Hip ABduction 4-/5  supine   Right Hip ADduction 4/5   Left Hip Flexion 5/5   Left Hip Extension 5/5  standing, range very limited   Left Hip External Rotation 5/5  painful   Left Hip Internal Rotation 3-/5  (+) left greater trochanteric pain   Left Hip ABduction 3+/5   Left Hip ADduction 4/5   Right/Left Knee Right;Left   Right Knee Flexion 4/5   Right Knee Extension 5/5   Left Knee Flexion 5/5   Left Knee Extension 5/5   Right Ankle Dorsiflexion 5/5   Left Ankle Dorsiflexion 5/5     Right Hip   Right Hip Extension -15  lacks 15 degrees from neutral     Thoracic   Thoracic Flexion Maintained hypomobile kyphosis  Excessive with flexion   Thoracic Extension Maintained hypomobile kyphosis     Flexibility   Soft Tissue Assessment /Muscle Length yes   Hamstrings Able to perform standing toe touch     Ambulation/Gait   Ambulation/Gait Yes   Assistive device Straight cane   Gait velocity 0.108m/s   Gait Comments 60M walk test                            PT Education - 05/01/16 1614    Education provided No          PT Short Term Goals - 05/01/16 1629      PT SHORT TERM GOAL #1   Title After 4 weeks pt will report good compliance with started HEP with assistance from family.    Baseline No HEP    Status New     PT SHORT TERM GOAL #2   Title After 4 weeks patient will demonstrate maximal gait speed of >0.37m/s during 60MWT.    Baseline 0.14m/s   Status New     PT SHORT TERM GOAL  #3   Title After 4 weeks patient will demonstrate Left Hip IR MMT > 3+/5.    Status New     PT SHORT TERM GOAL #4   Title After 4 weeks pt will report less than 5/10 pain in left lower back after 6 minutes of sustained walking.   Status New           PT Long Term Goals - 05/01/16 1632      PT LONG TERM GOAL #1   Title After 8 weeks pt will report good compliance with advanced HEP with assistance from family.    Status New     PT LONG TERM GOAL #2   Title After 8 weeks patient will demonstrate maximal gait speed of >0.78m/s during 60MWT.    Status New     PT LONG TERM GOAL #3   Title After 8 weeks patient will demonstrate Left Hip IR MMT > 4/5.    Status New     PT LONG TERM GOAL #4   Title After 8 weeks pt will report less than 3/10 pain in left lower back after 6 minutes of sustained walking.   Status New               Plan - 05/01/16 1617    Clinical Impression Statement Pt arrives for PT evaluation with her sister, is unable to say inittially qhy Dr. Luna Glasgow has sent her to PT, but then eventually discussed some pain she has been experiencing in her lower back adn left  leg. The pt demonstrates postura impairments in the thoracic spine, lumbar spine, and hips, with joint range limitation in the bilat scapulothoracic joint, bilat glenohumeral joints, and bilat knee joints. Sensation is intact. Pt demonstrates gait instability and unsafe postural tendencies during mobility and transfers.  Pt c/o pain in the left rochanteric area during testing of hip IR strength, as well as passive stretch of of left hip internal rotators, and palpation around the greater trochanter, all consistent with musculotendinous pathology of the gluteus medius. Maximal gait speed is ~0.18m/s indicative of high falls risk and difficulty ABM outside of the home. Mild cognitive impairment noted, but not extensively evaluated at this time.    Rehab Potential Good   Clinical Impairments Affecting Rehab  Potential mild -moderate cognitive impairment    PT Frequency 2x / week   PT Duration 8 weeks   PT Treatment/Interventions ADLs/Self Care Home Management;Moist Heat;Therapeutic activities;Therapeutic exercise;Balance training;Neuromuscular re-education;Cognitive remediation;Patient/family education;Gait training;Stair training;Functional mobility training;DME Instruction;Passive range of motion   PT Next Visit Plan Review Goals; Trial HEP activites for tolerance and dosage; teach HEP to family member.    PT Home Exercise Plan Next time: SKTC, Table Slides, Abduction heel slides, Bridging, W's    Consulted and Agree with Plan of Care Patient      Patient will benefit from skilled therapeutic intervention in order to improve the following deficits and impairments:  Abnormal gait, Decreased activity tolerance, Decreased balance, Decreased cognition, Decreased mobility, Decreased knowledge of use of DME, Decreased strength, Decreased knowledge of precautions, Decreased range of motion, Decreased safety awareness, Hypomobility, Increased edema, Improper body mechanics, Postural dysfunction, Impaired flexibility, Pain  Visit Diagnosis: Chronic left-sided low back pain, with sciatica presence unspecified  Pain in left hip  Abnormal posture  Unsteadiness on feet      G-Codes - 05/09/16 1634    Functional Assessment Tool Used Clinical Judgment    Functional Limitation Mobility: Walking and moving around   Mobility: Walking and Moving Around Current Status [phone]) At least 40 percent but less than 60 percent impaired, limited or restricted   Mobility: Walking and Moving Around Goal Status [phone]) At least 40 percent but less than 60 percent impaired, limited or restricted       Problem List Patient Active Problem List   Diagnosis Date Noted  . Lumbar stenosis 09/11/2015  . Complex partial seizure ([place]) 09/02/2013  . Subacute confusional state 07/30/2013  . Carotid artery disease ([place])    . Diverticulosis 03/06/2012  . Abnormal CT of the abdomen 11/25/2011  . Abdominal pain, unspecified site 11/25/2011  . Ejection fraction   . Intolerance of drug   . Hip fracture ([place])   . UTI (urinary tract infection) 04/25/2011  . Leukocytosis 04/24/2011  . Hyponatremia 04/24/2011  . Fracture of left hip ([place]) 04/24/2011  . Asthmatic bronchitis 04/24/2011  . Anemia 04/24/2011  . Hypothyroidism 04/24/2011  . DM2 (diabetes mellitus, type 2) ([place]) 04/24/2011  . Fracture of rib of left side 04/24/2011  . Mitral regurgitation   . Paroxysmal supraventricular tachycardia ([place])   . Hypertension   . Asthmatic bronchitis   . Obesity   . GERD (gastroesophageal reflux disease)   . CAD (coronary artery disease)   . Edema 03/29/2010    4:37 PM, 2016/05/09 Etta Grandchild, PT, DPT Physical Therapist at [place] [phone] (office)     [facility] [address] [place], [place], [address] Phone: [phone]   Fax:  [phone]  Name: Katie Ford MRN: [id number] Date of Birth: 1932-04-12

## 2016-05-02 ENCOUNTER — Ambulatory Visit (HOSPITAL_COMMUNITY): Payer: Medicare Other

## 2016-05-02 DIAGNOSIS — R2681 Unsteadiness on feet: Secondary | ICD-10-CM

## 2016-05-02 DIAGNOSIS — M545 Low back pain: Principal | ICD-10-CM

## 2016-05-02 DIAGNOSIS — G8929 Other chronic pain: Secondary | ICD-10-CM

## 2016-05-02 DIAGNOSIS — R293 Abnormal posture: Secondary | ICD-10-CM

## 2016-05-02 DIAGNOSIS — M25552 Pain in left hip: Secondary | ICD-10-CM

## 2016-05-02 NOTE — Therapy (Signed)
[place], [place], [address] Phone: [phone]   Fax:  [phone]  Physical Therapy Treatment  Patient Details  Name: Katie Ford MRN: [id number] Date of Birth: [birth year] Referring Provider: Sanjuana Kava   Encounter Date: 05/02/2016      PT End of Session - 05/02/16 1311    Visit Number 2   Number of Visits 16   Date for PT Re-Evaluation 06/01/16   Authorization Type U[employer] Medicare    Authorization Time Period 28-May-2016-07-28-16 (G-codes done on 05/28/16)   Authorization - Visit Number 2   Authorization - Number of Visits 10   PT Start Time 1301   PT Stop Time 1343   PT Time Calculation (min) 42 min   Equipment Utilized During Treatment Gait belt   Activity Tolerance Patient tolerated treatment well;Patient limited by fatigue;No increased pain   Behavior During Therapy WFL for tasks assessed/performed      Past Medical History:  Diagnosis Date  . Asthmatic bronchitis   . CAD (coronary artery disease)    bare metal stents to 2 separate RCA lesions August, 2011, residual 60% LAD, medical therapy  /  plan was for effient for one month followed by Plavix,, but patient does not tolerate Plavix... therefore patient placed back on effient  . Carotid artery disease ([place])   . Diabetes mellitus    does not have  . Diverticulitis    remote hx  . Edema 03/29/2010   September, 2011  . Ejection fraction    EF 65 %... normal... catheterization 02/2010.  . Fracture of rib of left side 04/24/2011  . GERD (gastroesophageal reflux disease)   . Hip fracture [facility])    October, 2012  . Hyperglycemia 04/24/2011  . Hypertension   . Hypothyroidism 04/24/2011  . Intolerance of drug    aspirin and plavix  . Mitral regurgitation    mild, echo, June, 2007  . Obesity   . Paroxysmal supraventricular tachycardia (HCC)    infrequent episodes over the years..palpitations.    Past Surgical History:  Procedure Laterality  Date  . ANKLE FRACTURE SURGERY  12/1993   [place]  . ARTHROPLASTY W/ ARTHROSCOPY MEDIAL / LATERAL COMPARTMENT KNEE     [facility], should read arthroscopy not arthroplasty  . CARDIAC CATHETERIZATION  02/22/2010   2 stents, [place]  . ESOPHAGOGASTRODUODENOSCOPY  12/06/11   fields: Negative H. pylori, hiatal hernia/moderate gastritis  . ileocolonscopy  12/06/11   fields:diverticula throughtout the colon/inertnal hemorrhoids/  . KNEE ARTHROSCOPY  11/2001   left knee  . KNEE ARTHROSCOPY  02/19/2011   Procedure: ARTHROSCOPY KNEE;  Surgeon: Sanjuana Kava;  Location: [facility];  Service: Orthopedics;  Laterality: Right;  Medial and Lateral Partial Menisectomy  . LAPAROSCOPIC CHOLECYSTECTOMY  04/29/1996   [facility]  . ORIF HIP FRACTURE  04/25/2011   Procedure: OPEN REDUCTION INTERNAL FIXATION HIP;  Surgeon: Sanjuana Kava;  Location: [facility];  Service: Orthopedics;  Laterality: Left;  . SP ARTHRO THUMB*R*  05/1989   [place]  . TOTAL ABDOMINAL HYSTERECTOMY  1983   [place]  . TUBAL LIGATION  09/1970    There were no vitals filed for this visit.      Subjective Assessment - 05/02/16 1306    Subjective Pt reports she is feeling good today, no reports of pain currently just a little sore following last session   Pertinent History history of hip Fx s/p ORIF. Some memory problems at baseline.    Patient Stated Goals Reduce  pain in back and left leg.    Currently in Pain? No/denies                         [facility] Adult PT Treatment/Exercise - 05/02/16 0001      Exercises   Exercises Lumbar     Lumbar Exercises: Stretches   Single Knee to Chest Stretch 2 reps;60 seconds     Lumbar Exercises: Seated   Other Seated Lumbar Exercises UE Flexion table slides 10x each; wback with tactile cueing   Other Seated Lumbar Exercises Education on importance of posture     Lumbar Exercises: Supine   Bridge 10 reps   Other Supine Lumbar Exercises Abduction 10reps                PT Education - 05/02/16  1454    Education provided Yes   Education Details Reviewed goals, established/instructed HEP, copy of eval given to pt.     Person(s) Educated Patient;Other (comment)  sister   Methods Explanation;Demonstration;Tactile cues;Verbal cues;Handout   Comprehension Verbalized understanding;Returned demonstration;Need further instruction          PT Short Term Goals - 05/01/16 1629      PT SHORT TERM GOAL #1   Title After 4 weeks pt will report good compliance with started HEP with assistance from family.    Baseline No HEP    Status New     PT SHORT TERM GOAL #2   Title After 4 weeks patient will demonstrate maximal gait speed of >0.28m/s during 10MWT.    Baseline 0.37m/s   Status New     PT SHORT TERM GOAL #3   Title After 4 weeks patient will demonstrate Left Hip IR MMT > 3+/5.    Status New     PT SHORT TERM GOAL #4   Title After 4 weeks pt will report less than 5/10 pain in left lower back after 6 minutes of sustained walking.   Status New           PT Long Term Goals - 05/01/16 1632      PT LONG TERM GOAL #1   Title After 8 weeks pt will report good compliance with advanced HEP with assistance from family.    Status New     PT LONG TERM GOAL #2   Title After 8 weeks patient will demonstrate maximal gait speed of >0.84m/s during 10MWT.    Status New     PT LONG TERM GOAL #3   Title After 8 weeks patient will demonstrate Left Hip IR MMT > 4/5.    Status New     PT LONG TERM GOAL #4   Title After 8 weeks pt will report less than 3/10 pain in left lower back after 6 minutes of sustained walking.   Status New               Plan - 05/02/16 1416    Clinical Impression Statement Reviewed goals, instructed proper technique and form with HEP and copy of eval given to pt.  Pt educated on importance of proper posture for pain relief.  Multimodal cueing required for proper form and technique with therex especially tactile cueing with wback scapular retraction.   Pt.'s sister sat through session and educated on proper form and technqiue with HEP with ability to verbalize correct form wtih all exercises.  No reports of pain through session, was limited by fatigue.     Rehab Potential  Good   Clinical Impairments Affecting Rehab Potential mild -moderate cognitive impairment    PT Frequency 2x / week   PT Duration 8 weeks   PT Treatment/Interventions ADLs/Self Care Home Management;Moist Heat;Therapeutic activities;Therapeutic exercise;Balance training;Neuromuscular re-education;Cognitive remediation;Patient/family education;Gait training;Stair training;Functional mobility training;DME Instruction;Passive range of motion   PT Next Visit Plan F/U with compliance with HEP, continue education with family members.  Continue to focus on proximal musculature strengthening and posture awareness.     PT Home Exercise Plan 05/02/2016: SKTC, Table Slides, Abduction heel slides, Bridging, W's       Patient will benefit from skilled therapeutic intervention in order to improve the following deficits and impairments:  Abnormal gait, Decreased activity tolerance, Decreased balance, Decreased cognition, Decreased mobility, Decreased knowledge of use of DME, Decreased strength, Decreased knowledge of precautions, Decreased range of motion, Decreased safety awareness, Hypomobility, Increased edema, Improper body mechanics, Postural dysfunction, Impaired flexibility, Pain  Visit Diagnosis: Chronic left-sided low back pain, with sciatica presence unspecified  Pain in left hip  Abnormal posture  Unsteadiness on feet    Problem List Patient Active Problem List   Diagnosis Date Noted  . Lumbar stenosis 09/11/2015  . Complex partial seizure ([place]) 09/02/2013  . Subacute confusional state 07/30/2013  . Carotid artery disease ([place])   . Diverticulosis 03/06/2012  . Abnormal CT of the abdomen 11/25/2011  . Abdominal pain, unspecified site 11/25/2011  . Ejection fraction    . Intolerance of drug   . Hip fracture ([place])   . UTI (urinary tract infection) 04/25/2011  . Leukocytosis 04/24/2011  . Hyponatremia 04/24/2011  . Fracture of left hip ([place]) 04/24/2011  . Asthmatic bronchitis 04/24/2011  . Anemia 04/24/2011  . Hypothyroidism 04/24/2011  . DM2 (diabetes mellitus, type 2) ([place]) 04/24/2011  . Fracture of rib of left side 04/24/2011  . Mitral regurgitation   . Paroxysmal supraventricular tachycardia ([place])   . Hypertension   . Asthmatic bronchitis   . Obesity   . GERD (gastroesophageal reflux disease)   . CAD (coronary artery disease)   . Edema 03/29/2010   Ihor Austin, [place]; [place]  Aldona Lento 05/02/2016, 2:58 PM  [facility] [address] [place], [place], [address] Phone: [phone]   Fax:  [phone]  Name: Katie Ford MRN: [id number] Date of Birth: [birth year]

## 2016-05-07 ENCOUNTER — Telehealth (HOSPITAL_COMMUNITY): Payer: Self-pay | Admitting: Physical Therapy

## 2016-05-07 ENCOUNTER — Ambulatory Visit (HOSPITAL_COMMUNITY): Payer: Medicare Other | Admitting: Physical Therapy

## 2016-05-07 NOTE — Telephone Encounter (Signed)
She has had asthma all night and cannot come in

## 2016-05-09 ENCOUNTER — Ambulatory Visit (HOSPITAL_COMMUNITY): Payer: Medicare Other | Admitting: Physical Therapy

## 2016-05-09 DIAGNOSIS — M545 Low back pain: Secondary | ICD-10-CM | POA: Diagnosis not present

## 2016-05-09 DIAGNOSIS — G8929 Other chronic pain: Secondary | ICD-10-CM

## 2016-05-09 DIAGNOSIS — M25552 Pain in left hip: Secondary | ICD-10-CM

## 2016-05-09 DIAGNOSIS — R2681 Unsteadiness on feet: Secondary | ICD-10-CM

## 2016-05-09 DIAGNOSIS — R293 Abnormal posture: Secondary | ICD-10-CM

## 2016-05-09 NOTE — Therapy (Signed)
[place], [place], [address] Phone: [phone]   Fax:  [phone]  Physical Therapy Treatment  Patient Details  Name: Katie Ford MRN: [id number] Date of Birth: Oct 27, 1931 Referring Provider: Sanjuana Kava   Encounter Date: 05/09/2016      PT End of Session - 05/09/16 1552    Visit Number 3   Number of Visits 16   Date for PT Re-Evaluation 06/01/16   Authorization Type [employer]HC Medicare    Authorization Time Period May 12, 2016-12-Jul-2016 (G-codes done on 05/12/2016)   Authorization - Visit Number 3   Authorization - Number of Visits 10   PT Start Time [id number]   PT Stop Time 1553   PT Time Calculation (min) 38 min   Equipment Utilized During Treatment Gait belt   Activity Tolerance Patient tolerated treatment well;Patient limited by fatigue;No increased pain   Behavior During Therapy WFL for tasks assessed/performed      Past Medical History:  Diagnosis Date  . Asthmatic bronchitis   . CAD (coronary artery disease)    bare metal stents to 2 separate RCA lesions August, 2011, residual 60% LAD, medical therapy  /  plan was for effient for one month followed by Plavix,, but patient does not tolerate Plavix... therefore patient placed back on effient  . Carotid artery disease ([place])   . Diabetes mellitus    does not have  . Diverticulitis    remote hx  . Edema 03/29/2010   September, 2011  . Ejection fraction    EF 65 %... normal... catheterization 02/2010.  . Fracture of rib of left side 04/24/2011  . GERD (gastroesophageal reflux disease)   . Hip fracture [facility])    October, 2012  . Hyperglycemia 04/24/2011  . Hypertension   . Hypothyroidism 04/24/2011  . Intolerance of drug    aspirin and plavix  . Mitral regurgitation    mild, echo, June, 2007  . Obesity   . Paroxysmal supraventricular tachycardia (HCC)    infrequent episodes over the years..palpitations.    Past Surgical History:  Procedure Laterality  Date  . ANKLE FRACTURE SURGERY  12/1993   [place]  . ARTHROPLASTY W/ ARTHROSCOPY MEDIAL / LATERAL COMPARTMENT KNEE     [facility], should read arthroscopy not arthroplasty  . CARDIAC CATHETERIZATION  02/22/2010   2 stents, [place]  . ESOPHAGOGASTRODUODENOSCOPY  12/06/11   fields: Negative H. pylori, hiatal hernia/moderate gastritis  . ileocolonscopy  12/06/11   fields:diverticula throughtout the colon/inertnal hemorrhoids/  . KNEE ARTHROSCOPY  11/2001   left knee  . KNEE ARTHROSCOPY  02/19/2011   Procedure: ARTHROSCOPY KNEE;  Surgeon: Sanjuana Kava;  Location: [facility];  Service: Orthopedics;  Laterality: Right;  Medial and Lateral Partial Menisectomy  . LAPAROSCOPIC CHOLECYSTECTOMY  04/29/1996   [facility]  . ORIF HIP FRACTURE  04/25/2011   Procedure: OPEN REDUCTION INTERNAL FIXATION HIP;  Surgeon: Sanjuana Kava;  Location: [facility];  Service: Orthopedics;  Laterality: Left;  . SP ARTHRO THUMB*R*  05/1989   [place]  . TOTAL ABDOMINAL HYSTERECTOMY  1983   [place]  . TUBAL LIGATION  09/1970    There were no vitals filed for this visit.      Subjective Assessment - 05/09/16 1523    Subjective Pt reports she is feeling good today, no reports of pain currently just a little sore following last session   Pertinent History history of hip Fx s/p ORIF. Some memory problems at baseline.    Limitations Sitting  How long can you sit comfortably? 2 hours   How long can you stand comfortably? Pt unsure.   How long can you walk comfortably? Pt unsure, likely an hour.    Diagnostic tests MRI spine.    Patient Stated Goals Reduce pain in back and left leg.    Currently in Pain? Yes   Pain Score 5    Pain Location Leg   Pain Orientation Right   Pain Descriptors / Indicators Aching;Sore   Pain Onset More than a month ago                         [facility] Adult PT Treatment/Exercise - 05/09/16 0001      Lumbar Exercises: Stretches   Passive Hamstring Stretch 1 rep;60 seconds     Lumbar Exercises:  Supine   Ab Set 5 reps  6 Pt star   Bent Knee Raise 10 reps   Bridge 10 reps   Other Supine Lumbar Exercises decompression ex 1-5                  PT Short Term Goals - 05/09/16 1555      PT SHORT TERM GOAL #1   Title After 4 weeks pt will report good compliance with started HEP with assistance from family.    Baseline No HEP    Status On-going     PT SHORT TERM GOAL #2   Title After 4 weeks patient will demonstrate maximal gait speed of >0.7m/s during 10MWT.    Baseline 0.71m/s   Status On-going     PT SHORT TERM GOAL #3   Title After 4 weeks patient will demonstrate Left Hip IR MMT > 3+/5.    Status On-going     PT SHORT TERM GOAL #4   Title After 4 weeks pt will report less than 5/10 pain in left lower back after 6 minutes of sustained walking.   Status On-going           PT Long Term Goals - 05/09/16 1555      PT LONG TERM GOAL #1   Title After 8 weeks pt will report good compliance with advanced HEP with assistance from family.    Status On-going     PT LONG TERM GOAL #2   Title After 8 weeks patient will demonstrate maximal gait speed of >0.70m/s during 10MWT.    Status On-going     PT LONG TERM GOAL #3   Title After 8 weeks patient will demonstrate Left Hip IR MMT > 4/5.    Status On-going     PT LONG TERM GOAL #4   Title After 8 weeks pt will report less than 3/10 pain in left lower back after 6 minutes of sustained walking.   Status New               Plan - 05/09/16 1553    Clinical Impression Statement Pt has not been back due to her asthma.  Therapist instructed pt in decompression and 6 point abdominal strengthening exercises.  Pt able to demonstrate good form with minimal verbal cuing .   Rehab Potential Good   Clinical Impairments Affecting Rehab Potential mild -moderate cognitive impairment    PT Frequency 2x / week   PT Duration 8 weeks   PT Treatment/Interventions ADLs/Self Care Home Management;Moist Heat;Therapeutic  activities;Therapeutic exercise;Balance training;Neuromuscular re-education;Cognitive remediation;Patient/family education;Gait training;Stair training;Functional mobility training;DME Instruction;Passive range of motion   PT Next Visit Plan  F/U with compliance with HEP, continue education with family members.  Continue to focus on proximal musculature strengthening and posture awareness.     PT Home Exercise Plan 05/02/2016: SKTC, Table Slides, Abduction heel slides, Bridging, W's ,decompression exercises and abdominal strengthening       Patient will benefit from skilled therapeutic intervention in order to improve the following deficits and impairments:  Abnormal gait, Decreased activity tolerance, Decreased balance, Decreased cognition, Decreased mobility, Decreased knowledge of use of DME, Decreased strength, Decreased knowledge of precautions, Decreased range of motion, Decreased safety awareness, Hypomobility, Increased edema, Improper body mechanics, Postural dysfunction, Impaired flexibility, Pain  Visit Diagnosis: Chronic left-sided low back pain, with sciatica presence unspecified  Pain in left hip  Abnormal posture  Unsteadiness on feet     Problem List Patient Active Problem List   Diagnosis Date Noted  . Lumbar stenosis 09/11/2015  . Complex partial seizure ([place]) 09/02/2013  . Subacute confusional state 07/30/2013  . Carotid artery disease ([place])   . Diverticulosis 03/06/2012  . Abnormal CT of the abdomen 11/25/2011  . Abdominal pain, unspecified site 11/25/2011  . Ejection fraction   . Intolerance of drug   . Hip fracture ([place])   . UTI (urinary tract infection) 04/25/2011  . Leukocytosis 04/24/2011  . Hyponatremia 04/24/2011  . Fracture of left hip ([place]) 04/24/2011  . Asthmatic bronchitis 04/24/2011  . Anemia 04/24/2011  . Hypothyroidism 04/24/2011  . DM2 (diabetes mellitus, type 2) ([place]) 04/24/2011  . Fracture of rib of left side 04/24/2011  . Mitral  regurgitation   . Paroxysmal supraventricular tachycardia ([place])   . Hypertension   . Asthmatic bronchitis   . Obesity   . GERD (gastroesophageal reflux disease)   . CAD (coronary artery disease)   . Edema 03/29/2010    Rayetta Humphrey, PT CLT [phone] 05/09/2016, 3:56 PM  [place] [address] [place], [place], [address] Phone: [phone]   Fax:  [phone]  Name: Katie Ford MRN: [id number] Date of Birth: Oct 26, 1931

## 2016-05-14 ENCOUNTER — Telehealth (HOSPITAL_COMMUNITY): Payer: Self-pay

## 2016-05-14 ENCOUNTER — Ambulatory Visit (HOSPITAL_COMMUNITY): Payer: Medicare Other

## 2016-05-14 NOTE — Telephone Encounter (Signed)
Lack of transportation pt plans to be here on Thursday

## 2016-05-16 ENCOUNTER — Ambulatory Visit (HOSPITAL_COMMUNITY): Payer: Medicare Other

## 2016-05-16 ENCOUNTER — Telehealth (HOSPITAL_COMMUNITY): Payer: Self-pay

## 2016-05-16 NOTE — Telephone Encounter (Signed)
05/16/16 caller called to cx - said that she was to sore and had a tummy problem

## 2016-05-21 ENCOUNTER — Ambulatory Visit (HOSPITAL_COMMUNITY): Payer: Medicare Other

## 2016-05-21 DIAGNOSIS — R293 Abnormal posture: Secondary | ICD-10-CM

## 2016-05-21 DIAGNOSIS — R2681 Unsteadiness on feet: Secondary | ICD-10-CM

## 2016-05-21 DIAGNOSIS — G8929 Other chronic pain: Secondary | ICD-10-CM

## 2016-05-21 DIAGNOSIS — M25552 Pain in left hip: Secondary | ICD-10-CM

## 2016-05-21 DIAGNOSIS — M545 Low back pain: Secondary | ICD-10-CM | POA: Diagnosis not present

## 2016-05-21 NOTE — Therapy (Addendum)
PHYSICAL THERAPY DISCHARGE SUMMARY  Visits from Start of Care: 4  Current functional level related to goals / functional outcomes: *see below    Remaining deficits: *see below    Education / Equipment: *see below  Plan: Patient agrees to discharge.  Patient goals were not met. Patient is being discharged due to the patient's request.  ?????          9:18 PM, 06/11/16 Etta Grandchild, PT, DPT Physical Therapist at [facility] [phone] (office)        [facility] [address] [place], [place], [address] Phone: [phone]   Fax:  [phone]  Physical Therapy Treatment  Patient Details  Name: Katie Ford MRN: [id number] Date of Birth: 02-15-1932 Referring Provider: Sanjuana Kava   Encounter Date: 05/21/2016      PT End of Session - 05/21/16 0959    Visit Number 4   Number of Visits 16   Date for PT Re-Evaluation 06/01/16   Authorization Type [employer]    Authorization Time Period May 08, 2016-2016/07/08 (G-codes done on 05-08-16)   Authorization - Visit Number 4   Authorization - Number of Visits 10   PT Start Time 0945   PT Stop Time 1025   PT Time Calculation (min) 40 min   Equipment Utilized During Treatment Gait belt   Activity Tolerance Patient tolerated treatment well;No increased pain   Behavior During Therapy WFL for tasks assessed/performed      Past Medical History:  Diagnosis Date  . Asthmatic bronchitis   . CAD (coronary artery disease)    bare metal stents to 2 separate RCA lesions August, 2011, residual 60% LAD, medical therapy  /  plan was for effient for one month followed by Plavix,, but patient does not tolerate Plavix... therefore patient placed back on effient  . Carotid artery disease ([place])   . Diabetes mellitus    does not have  . Diverticulitis    remote hx  . Edema 03/29/2010   September, 2011  . Ejection fraction    EF 65 %... normal...  catheterization 02/2010.  . Fracture of rib of left side 04/24/2011  . GERD (gastroesophageal reflux disease)   . Hip fracture [facility])    October, 2012  . Hyperglycemia 04/24/2011  . Hypertension   . Hypothyroidism 04/24/2011  . Intolerance of drug    aspirin and plavix  . Mitral regurgitation    mild, echo, June, 2007  . Obesity   . Paroxysmal supraventricular tachycardia (HCC)    infrequent episodes over the years..palpitations.    Past Surgical History:  Procedure Laterality Date  . ANKLE FRACTURE SURGERY  12/1993   [place]  . ARTHROPLASTY W/ ARTHROSCOPY MEDIAL / LATERAL COMPARTMENT KNEE     [facility], should read arthroscopy not arthroplasty  . CARDIAC CATHETERIZATION  02/22/2010   2 stents, [place]  . ESOPHAGOGASTRODUODENOSCOPY  12/06/11   fields: Negative H. pylori, hiatal hernia/moderate gastritis  . ileocolonscopy  12/06/11   fields:diverticula throughtout the colon/inertnal hemorrhoids/  . KNEE ARTHROSCOPY  11/2001   left knee  . KNEE ARTHROSCOPY  02/19/2011   Procedure: ARTHROSCOPY KNEE;  Surgeon: Sanjuana Kava;  Location: [facility];  Service: Orthopedics;  Laterality: Right;  Medial and Lateral Partial Menisectomy  . LAPAROSCOPIC CHOLECYSTECTOMY  04/29/1996   [place] [facility]  . ORIF HIP FRACTURE  04/25/2011   Procedure: OPEN REDUCTION INTERNAL FIXATION HIP;  Surgeon: Sanjuana Kava;  Location: [facility];  Service: Orthopedics;  Laterality:  Left;  . SP ARTHRO THUMB*R*  05/1989   [place]  . TOTAL ABDOMINAL HYSTERECTOMY  1983   [place]  . TUBAL LIGATION  09/1970    There were no vitals filed for this visit.      Subjective Assessment - 05/21/16 0950    Subjective Pt is doing ok today. She says she has soreness in her quads when walking, but they feel ok otherwise. Flo reports family has been helping with HEP.    Pertinent History history of hip Fx s/p ORIF. Some memory problems at baseline.    Currently in Pain? No/denies  soreness only; does not give a number                          [facility] Adult PT Treatment/Exercise - 05/21/16 0001      Ambulation/Gait   Ambulation/Gait Yes   Ambulation Distance (Feet) --  2x225'   Assistive device None   Gait velocity 0.13ms     Lumbar Exercises: Stretches   Single Knee to Chest Stretch 5 reps;30 seconds  5x each leg, alteranting sides   Double Knee to Chest Stretch --  REpeated florr touches in sitting, 20x   Lower Trunk Rotation 3 reps;30 seconds     Lumbar Exercises: Supine   Heel Slides Other (comment)  1x15x5sH c rope bilat   Bent Knee Raise 10 reps  left side only   Bridge 10 reps  2x10   Straight Leg Raise --  2x10 Left side only                  PT Short Term Goals - 05/09/16 1555      PT SHORT TERM GOAL #1   Title After 4 weeks pt will report good compliance with started HEP with assistance from family.    Baseline No HEP    Status On-going     PT SHORT TERM GOAL #2   Title After 4 weeks patient will demonstrate maximal gait speed of >0.736m during 10MWT.    Baseline 0.5044m  Status On-going     PT SHORT TERM GOAL #3   Title After 4 weeks patient will demonstrate Left Hip IR MMT > 3+/5.    Status On-going     PT SHORT TERM GOAL #4   Title After 4 weeks pt will report less than 5/10 pain in left lower back after 6 minutes of sustained walking.   Status On-going           PT Long Term Goals - 05/09/16 1555      PT LONG TERM GOAL #1   Title After 8 weeks pt will report good compliance with advanced HEP with assistance from family.    Status On-going     PT LONG TERM GOAL #2   Title After 8 weeks patient will demonstrate maximal gait speed of >0.20m19muring 10MWT.    Status On-going     PT LONG TERM GOAL #3   Title After 8 weeks patient will demonstrate Left Hip IR MMT > 4/5.    Status On-going     PT LONG TERM GOAL #4   Title After 8 weeks pt will report less than 3/10 pain in left lower back after 6 minutes of sustained walking.   Status New                Plan - 05/21/16 1000    Clinical Impression Statement Pt tolerating treatment session  well today, no increase in pain, and no clear limitations from fatigue. Pt has some difficulty attending to the task of performing HEP, losing track of hold times/reps and requiring close supervision for form. Pt demonstrates some improvement in hip ROM defcitis compared to evaluation assessment; knee ROm remains very limited.     Rehab Potential Good   Clinical Impairments Affecting Rehab Potential mild -moderate cognitive impairment    PT Frequency 2x / week   PT Duration 8 weeks   PT Treatment/Interventions ADLs/Self Care Home Management;Moist Heat;Therapeutic activities;Therapeutic exercise;Balance training;Neuromuscular re-education;Cognitive remediation;Patient/family education;Gait training;Stair training;Functional mobility training;DME Instruction;Passive range of motion   PT Next Visit Plan Continue to focus on proximal musculature strengthening and postural strength. Progress gait as tolerated.    PT Home Exercise Plan 05/02/2016: SKTC, Table Slides, Abduction heel slides, Bridging, W's ,decompression exercises and abdominal strengthening    Consulted and Agree with Plan of Care Patient      Patient will benefit from skilled therapeutic intervention in order to improve the following deficits and impairments:  Abnormal gait, Decreased activity tolerance, Decreased balance, Decreased cognition, Decreased mobility, Decreased knowledge of use of DME, Decreased strength, Decreased knowledge of precautions, Decreased range of motion, Decreased safety awareness, Hypomobility, Increased edema, Improper body mechanics, Postural dysfunction, Impaired flexibility, Pain  Visit Diagnosis: Chronic left-sided low back pain, with sciatica presence unspecified  Pain in left hip  Abnormal posture  Unsteadiness on feet     Problem List Patient Active Problem List   Diagnosis Date Noted  . Lumbar stenosis  09/11/2015  . Complex partial seizure ([place]) 09/02/2013  . Subacute confusional state 07/30/2013  . Carotid artery disease ([place])   . Diverticulosis 03/06/2012  . Abnormal CT of the abdomen 11/25/2011  . Abdominal pain, unspecified site 11/25/2011  . Ejection fraction   . Intolerance of drug   . Hip fracture ([place])   . UTI (urinary tract infection) 04/25/2011  . Leukocytosis 04/24/2011  . Hyponatremia 04/24/2011  . Fracture of left hip ([place]) 04/24/2011  . Asthmatic bronchitis 04/24/2011  . Anemia 04/24/2011  . Hypothyroidism 04/24/2011  . DM2 (diabetes mellitus, type 2) ([place]) 04/24/2011  . Fracture of rib of left side 04/24/2011  . Mitral regurgitation   . Paroxysmal supraventricular tachycardia ([place])   . Hypertension   . Asthmatic bronchitis   . Obesity   . GERD (gastroesophageal reflux disease)   . CAD (coronary artery disease)   . Edema 03/29/2010   10:27 AM, 05/21/16 Etta Grandchild, PT, DPT Physical Therapist at [facility] [phone] (office)     [facility] [address] [place], [place], [address] Phone: [phone]   Fax:  [phone]  Name: Katie Ford MRN: [id number] Date of Birth: Sep 06, 1931

## 2016-05-23 ENCOUNTER — Telehealth (HOSPITAL_COMMUNITY): Payer: Self-pay

## 2016-05-23 ENCOUNTER — Ambulatory Visit (HOSPITAL_COMMUNITY): Payer: Medicare Other | Attending: Orthopaedic Surgery

## 2016-05-23 NOTE — Telephone Encounter (Signed)
No show, pt.'s son called and stated mother too sore to complete session today.  reminded next apt date and time with contact info given.    [address], [place]; [facility] [phone]

## 2016-05-23 NOTE — Telephone Encounter (Signed)
No show.  Pt.'s son Katie Ford called and stated [place] is too sore to come to session today.  Reminded son of next apt date and time with contact info given.  [address], [place]; [facility] [phone]

## 2016-05-28 ENCOUNTER — Ambulatory Visit (HOSPITAL_COMMUNITY): Payer: Medicare Other

## 2016-05-30 ENCOUNTER — Encounter (HOSPITAL_COMMUNITY): Payer: Medicare Other | Admitting: Physical Therapy

## 2016-06-04 ENCOUNTER — Ambulatory Visit (HOSPITAL_COMMUNITY): Payer: Medicare Other

## 2016-06-04 ENCOUNTER — Telehealth (HOSPITAL_COMMUNITY): Payer: Self-pay

## 2016-06-04 NOTE — Telephone Encounter (Signed)
Her son called Rod-He states she wants to be discharged she doesn't want to come back

## 2016-06-06 ENCOUNTER — Ambulatory Visit (HOSPITAL_COMMUNITY): Payer: Medicare Other

## 2016-06-10 ENCOUNTER — Encounter (HOSPITAL_COMMUNITY): Payer: Medicare Other

## 2016-06-12 ENCOUNTER — Encounter (HOSPITAL_COMMUNITY): Payer: Medicare Other | Admitting: Physical Therapy

## 2016-06-18 ENCOUNTER — Encounter (HOSPITAL_COMMUNITY): Payer: Medicare Other

## 2016-06-20 ENCOUNTER — Encounter (HOSPITAL_COMMUNITY): Payer: Medicare Other | Admitting: Physical Therapy

## 2016-09-11 ENCOUNTER — Ambulatory Visit (INDEPENDENT_AMBULATORY_CARE_PROVIDER_SITE_OTHER): Payer: Medicare Other | Admitting: Neurology

## 2016-09-11 ENCOUNTER — Encounter: Payer: Self-pay | Admitting: Neurology

## 2016-09-11 VITALS — BP 129/78 | HR 78 | Ht <= 58 in | Wt 159.0 lb

## 2016-09-11 DIAGNOSIS — G40209 Localization-related (focal) (partial) symptomatic epilepsy and epileptic syndromes with complex partial seizures, not intractable, without status epilepticus: Secondary | ICD-10-CM | POA: Diagnosis not present

## 2016-09-11 DIAGNOSIS — F0391 Unspecified dementia with behavioral disturbance: Secondary | ICD-10-CM | POA: Diagnosis not present

## 2016-09-11 DIAGNOSIS — F03918 Unspecified dementia, unspecified severity, with other behavioral disturbance: Secondary | ICD-10-CM | POA: Insufficient documentation

## 2016-09-11 MED ORDER — CITALOPRAM HYDROBROMIDE 10 MG PO TABS: 20.0000 mg | ORAL_TABLET | Freq: Every day | ORAL | 11 refills | Status: DC

## 2016-09-11 MED ORDER — LEVETIRACETAM 500 MG PO TABS: 500.0000 mg | ORAL_TABLET | Freq: Two times a day (BID) | ORAL | 4 refills | Status: DC

## 2016-09-11 NOTE — Progress Notes (Signed)
Chief Complaint  Patient presents with  . Seizures    She is here with her daughter, [name] and her grandson, [name].  No seizure activity reported.  . Memory Loss    MMSE 22/30 - 6 animals.  Feels memory is much worse.  She is also easily agitated. Her daughter stated she has a gun and has threatened to use it.  Her family has removed it from the home.  She lives at home with her sister.      [facility]  PATIENT: Katie Ford DOB: October 24, 1931  HISTORICAL  [name] is a 83  years old right-handed Caucasian female, accompanied by her sister, four daughters, referred by emergency room, and her primary care physician Dr. Deirdre Pippins for evaluation of recent onset confusion spells  She had a past medical history of anxiety, mitral valve regurgitation, coronary artery disease,cardiologist is Dr. Ron Parker, she is on effient, digoxin treatment.  She is a retired Careers information officer, remained to be physically active, including swimming regularly, she did has mild gait difficulty after her left hip fracture, and fixation surgery in  2012, she lives with her sister, prior her most recent hospital presentation in July 11 2013,she is very active, balance her checkbook,drive long distance without difficulty.  In July 11 2013, she was noticed to have difficulty sorted out her medications, got very frustrated, she complains of not feeling well, went to bed early that night, woke up around 1 AM in the morning, confused, she went to her sister's bedroom, on asking her where she is, later she went to bed, was noted by her family December 21st increased confusion, did not know where she is, she was taken to hospital, CAT scan of the brain showed previous old stroke in the left basal ganglion, chronic sinusitis, small vessel disease, no acute stroke,  Ultrasound of carotid arteries showed less than 50% stenosis of bilateral internal carotid artery  Bilateral lower extremity venous Doppler  showed no DVT in bilateral lower extremity,  Bilateral hip X-ray demonstrated previous left hip surgery, no evidence of acute process,  She was treated with antibiotics Keflex for sinusitis, over the past 2 weeks, she has improved, but not back to her baseline, she continued to have intermittent episodes of confusion, eye blinking spells, lasting for a few minutes, followed by extreme fatigue, tends to sleep afterwards  I have reviewed MRI together with patient and her family, MRI of the brain has demonstrated positive diffusion imaging shows a 5 mm focus of acute infarction in the midline body of the corpus callosum. No other acute infarction.  There chronic small-vessel changes affecting the pons.  There are moderate changes of chronic small vessel disease affecting the deep and subcortical cerebral hemispheric white matter. There was diffuse atrophy  She denies a previous history of seizure  UPDATE Sep 02, 2013: MRA of the brain was normal.    She fell, was taken to the emergency room, had MRI of pelvic showed no acute findings related to recent fall. Moderate right gluteus minimus tendinosis with adjacent bursal fluid. No tendon tear demonstrated. Left pelvic muscular atrophy related to remote left proximal femoral ORIF.  Lower lumbar spondylosis with chronic resulting foraminal  Stenosis.  MRI cervical in December 2014 Spondylosis at C5-6 contributes to mild central and moderate biforaminal stenosis.  Sometimes, urinary urgency since 2012, no change.   She was started on Keppra, 500mg  bid, she feels more alert, is also confirmed by her sister,  no more confusion episodes,  no more spells  UPDATE Sep 02 2014: She is now taking Keppra 500mg  bid, no more seizure like event, no side effect, still swimming.  She is driving some, word finding difficulty, short memory trouble, she lives with her sister,   UPDATE Sep 11 2015: She still swim, her low lumbar spine compression fracture  I have  personally reviewed MRI lumbar in Dec 2016: Severe degenerative lumbar spondylosis with multilevel disc disease and facet disease, severe multifactorial spinal stenosis and bilateral recess stenosis at L3-4, L4-5, broad-based left foraminal extraforaminal disc protrusion at L4-5 with significant left foraminal stenosis, Stable lower thoracic degenerative disc disease. Persistent spinal stenosis and mild malacia involving the lower thoracic cord at T11-12.  She has significant gait difficulty, low back pain, no recurrent seizure, tolerating Keppra 500 mg twice a day, continue has mild memory trouble  UPDATE Sep 11 2016: She is accompanied by her daughter and grandson at today's clinical visit, she had no recurrent seizure, but she had slight worsening memory loss, also becoming easily agitated, throw hot pan at her family members, chronic low back pain, hip pain, mild gait abnormality.  REVIEW OF SYSTEMS: Full 14 system review of systems performed and notable only for mild gait difficulty, knee pain   ALLERGIES: Allergies  Allergen Reactions  . Celecoxib Other (See Comments)    TACHYCARDIA  . Codeine Other (See Comments)    TACHYCARIDIA  . Hydrocodone Other (See Comments)    TACHYCARDIA  . Tartrazine Other (See Comments)    Causes Tachycardia  . Aspirin     ONLY TARTRAZINE!!! Causes Tachycardia    HOME MEDICATIONS: Outpatient Medications Prior to Visit  Medication Sig Dispense Refill  . acetaminophen (TYLENOL) 500 MG tablet Take 1,000 mg by mouth every 6 (six) hours as needed for mild pain.    Marland Kitchen ADVAIR DISKUS 250-50 MCG/DOSE AEPB Inhale 1 puff into the lungs every morning.    Marland Kitchen albuterol (PROVENTIL) (2.5 MG/3ML) 0.083% nebulizer solution Take 2.5 mg by nebulization daily as needed.    . ALPRAZolam (XANAX) 0.5 MG tablet Take 0.25-0.5 mg by mouth daily as needed. TAKE HALF TO ONE  A TABLET IF NEEDED FOR ANXIETY    . amLODipine (NORVASC) 10 MG tablet Take 10 mg by mouth every morning.      Marland Kitchen BREO ELLIPTA 100-25 MCG/INH AEPB Inhale 1 puff into the lungs daily.    . cetirizine (ZYRTEC) 10 MG tablet Take 5 mg by mouth 2 (two) times daily.     Marland Kitchen esomeprazole (NEXIUM) 40 MG capsule TAKE 1 CAPSULE (40 MG TOTAL)  BY MOUTH DAILY BEFORE BREAKFAST. 30 capsule 5  . fluticasone (FLONASE) 50 MCG/ACT nasal spray Place 1 spray into both nostrils daily as needed for allergies.     . fluticasone (FLOVENT HFA) 44 MCG/ACT inhaler Inhale 1 puff into the lungs 2 (two) times daily as needed (Shortness of Breath).     . levETIRAcetam (KEPPRA) 500 MG tablet Take 1 tablet (500 mg total) by mouth 2 (two) times daily. 180 tablet 3  . levothyroxine (SYNTHROID, LEVOTHROID) 88 MCG tablet Take 88 mcg by mouth daily before breakfast.    . losartan (COZAAR) 25 MG tablet Take 25 mg by mouth daily.     . metoprolol succinate (TOPROL-XL) 100 MG 24 hr tablet Take 100 mg by mouth daily. Take with or immediately following a meal.    . mometasone (NASONEX) 50 MCG/ACT nasal spray Place 2 sprays into the nose daily as needed (Congestion).     Marland Kitchen  nitroGLYCERIN (NITROSTAT) 0.4 MG SL tablet Place 1 tablet (0.4 mg total) under the tongue every 5 (five) minutes as needed for chest pain. 25 tablet 3  . polyethylene glycol (MIRALAX / GLYCOLAX) packet Take 17 g by mouth daily as needed for mild constipation.     . Polyvinyl Alcohol-Povidone (MURINE TEARS FOR DRY EYES) 5-6 MG/ML SOLN Place 1 drop into both eyes every morning. As needed    . prasugrel (EFFIENT) 5 MG TABS tablet Take 1 tablet (5 mg total) by mouth daily. 30 tablet 6  . traMADol (ULTRAM) 50 MG tablet Take 1 tablet (50 mg total) by mouth every 12 (twelve) hours as needed. for pain 60 tablet 0  . albuterol (PROAIR HFA) 108 (90 BASE) MCG/ACT inhaler Inhale 2 puffs into the lungs every 6 (six) hours as needed. FOR SHORTNESS OF BREATH    . CALCIUM-MAGNESUIUM-ZINC 333-133-8.3 MG TABS Take 1 tablet by mouth every morning.    . diclofenac-misoprostol (ARTHROTEC 75) 75-0.2 MG  per tablet Take 1 tablet by mouth 2 (two) times daily.       No facility-administered medications prior to visit.     PAST MEDICAL HISTORY: Past Medical History:  Diagnosis Date  . Asthmatic bronchitis   . CAD (coronary artery disease)    bare metal stents to 2 separate RCA lesions August, 2011, residual 60% LAD, medical therapy  /  plan was for effient for one month followed by Plavix,, but patient does not tolerate Plavix... therefore patient placed back on effient  . Carotid artery disease ([place])   . Diabetes mellitus    does not have  . Diverticulitis    remote hx  . Edema 03/29/2010   September, 2011  . Ejection fraction    EF 65 %... normal... catheterization 02/2010.  . Fracture of rib of left side 04/24/2011  . GERD (gastroesophageal reflux disease)   . Hip fracture [facility]    October, 2012  . Hyperglycemia 04/24/2011  . Hypertension   . Hypothyroidism 04/24/2011  . Intolerance of drug    aspirin and plavix  . Mitral regurgitation    mild, echo, June, 2007  . Obesity   . Paroxysmal supraventricular tachycardia (H[facility])    infrequent episodes over the years..palpitations.    PAST SURGICAL HISTORY: Past Surgical History:  Procedure Laterality Date  . ANKLE FRACTURE SURGERY  12/1993   [place]  . ARTHROPLASTY W/ ARTHROSCOPY MEDIAL / LATERAL COMPARTMENT KNEE     [facility], should read arthroscopy not arthroplasty  . CARDIAC CATHETERIZATION  02/22/2010   2 stents, [place]  . ESOPHAGOGASTRODUODENOSCOPY  12/06/11   fields: Negative H. pylori, hiatal hernia/moderate gastritis  . ileocolonscopy  12/06/11   fields:diverticula throughtout the colon/inertnal hemorrhoids/  . KNEE ARTHROSCOPY  11/2001   left knee  . KNEE ARTHROSCOPY  02/19/2011   Procedure: ARTHROSCOPY KNEE;  Surgeon: Sanjuana Kava;  Location: [facility];  Service: Orthopedics;  Laterality: Right;  Medial and Lateral Partial Menisectomy  . LAPAROSCOPIC CHOLECYSTECTOMY  04/29/1996   [place] [facility]  . ORIF HIP FRACTURE  04/25/2011    Procedure: OPEN REDUCTION INTERNAL FIXATION HIP;  Surgeon: Sanjuana Kava;  Location: [facility];  Service: Orthopedics;  Laterality: Left;  . SP ARTHRO THUMB*R*  05/1989   [place]  . TOTAL ABDOMINAL HYSTERECTOMY  1983   [place]  . TUBAL LIGATION  09/1970    FAMILY HISTORY: Family History  Problem Relation Age of Onset  . Diabetes Mother   . Arthritis Mother   . Heart attack Father   .  Stroke Father   . Emphysema Father   . Cataracts Father   . Cancer Sister   . Heart disease Sister   . Arthritis Sister   . Arthritis Brother   . Allergies Brother   . Colon cancer Neg Hx     SOCIAL HISTORY:  Social History   Social History  . Marital status: Widowed    Spouse name: N/A  . Number of children: 6  . Years of education: college   Occupational History  . [employer]     Education officer, museum   Social History Main Topics  . Smoking status: Never Smoker  . Smokeless tobacco: Never Used  . Alcohol use No     Comment: rarely  . Drug use: No  . Sexual activity: No   Other Topics Concern  . 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   Right handed.   Caffeine. None   Patient lives at home with her sister Bartolo Darter)            PHYSICAL EXAM   Vitals:   09/11/16 1341  BP: 129/78  Pulse: 78  Weight: 159 lb (72.1 kg)  Height: 4' 9.5" (1.461 m)    Not recorded      Body mass index is 33.81 kg/m.  PHYSICAL EXAMNIATION:  Gen: NAD, conversant, well nourised, obese, well groomed                     Cardiovascular: Regular rate rhythm, no peripheral edema, warm, nontender. Eyes: Conjunctivae clear without exudates or hemorrhage Neck: Supple, no carotid bruise. Pulmonary: Clear to auscultation bilaterally   NEUROLOGICAL EXAM:  MENTAL STATUS: Speech:    Speech is normal; fluent and spontaneous with normal comprehension.  Cognition: Mini-Mental Status Examination is22/30, animal naming is 6     Orientation: She is not  oriented to date, year, month, day,     Recent and remote memory: She missed 3 out of 3 recalls     Normal Attention span and concentration     Normal Language, naming, repeating,spontaneous speech, difficulty copy figures     Fund of knowledge   CRANIAL NERVES: CN II: Visual fields are full to confrontation. Fundoscopic exam is normal with sharp discs and no vascular changes. Pupils are round equal and briskly reactive to light. CN III, IV, VI: extraocular movement are normal. No ptosis. CN V: Facial sensation is intact to pinprick in all 3 divisions bilaterally. Corneal responses are intact.  CN VII: Face is symmetric with normal eye closure and smile. CN VIII: Hearing is normal to rubbing fingers CN IX, X: Palate elevates symmetrically. Phonation is normal. CN XI: Head turning and shoulder shrug are intact CN XII: Tongue is midline with normal movements and no atrophy.  MOTOR: There is no pronator drift of out-stretched arms. Muscle bulk and tone are normal. Muscle strength is normal.  REFLEXES: Reflexes are 2+ and symmetric at the biceps, triceps, knees, and ankles. Plantar responses are flexor.  SENSORY: Length dependent decreased to  light touch, pinprick and vibratory sensation to midshin level   COORDINATION: Rapid alternating movements and fine finger movements are intact. There is no dysmetria on finger-to-nose and heel-knee-shin.    GAIT/STANCE: She needs push up to get up from seated position, scoliosis, unsteady gait   DIAGNOSTIC DATA (LABS, IMAGING, TESTING) - I reviewed patient records, labs, notes, testing and imaging myself where available.  Lab  Results  Component Value Date   WBC 12.2 (H) 01/13/2016   HGB 12.4 01/13/2016   HCT 37.6 01/13/2016   MCV 88.1 01/13/2016   PLT 439 (H) 01/13/2016      Component Value Date/Time   NA 133 (L) 01/13/2016 0758   K 4.0 01/13/2016 0758   CL 98 (L) 01/13/2016 0758   CO2 29 01/13/2016 0758   GLUCOSE 120 (H)  01/13/2016 0758   BUN 24 (H) 01/13/2016 0758   CREATININE 0.96 01/13/2016 0758   CALCIUM 9.1 01/13/2016 0758   PROT 8.4 (H) 01/13/2016 0758   ALBUMIN 3.0 (L) 01/13/2016 0758   AST 15 01/13/2016 0758   ALT 14 01/13/2016 0758   ALKPHOS 103 01/13/2016 0758   BILITOT 0.4 01/13/2016 0758   GFRNONAA 54 (L) 01/13/2016 0758   GFRAA >60 01/13/2016 0758   Lab Results  Component Value Date   CHOL  02/23/2010    132        ATP III CLASSIFICATION:  <200     mg/dL   Desirable  200-239  mg/dL   Borderline High  >=240    mg/dL   High          HDL 33 (L) 02/23/2010   LDLCALC  02/23/2010    62        Total Cholesterol/HDL:CHD Risk Coronary Heart Disease Risk Table                     Men   Women  1/2 Average Risk   3.4   3.3  Average Risk       5.0   4.4  2 X Average Risk   9.6   7.1  3 X Average Risk  23.4   11.0        Use the calculated Patient Ratio above and the CHD Risk Table to determine the patient's CHD Risk.        ATP III CLASSIFICATION (LDL):  <100     mg/dL   Optimal  100-129  mg/dL   Near or Above                    Optimal  130-159  mg/dL   Borderline  160-189  mg/dL   High  >190     mg/dL   Very High   TRIG 184 (H) 02/23/2010   CHOLHDL 4.0 02/23/2010   Lab Results  Component Value Date   HGBA1C 6.6 (H) 04/25/2011   No results found for: [id number] Lab Results  Component Value Date   TSH 2.445 04/24/2011      ASSESSMENT AND PLAN 83  years old Caucasian female  presenting with acute confusion, in the setting of mild memory trouble, acute sinusitis, antibiotic treatment, MRI of the brain has demonstrates diffuse atrophy, moderate small vessel disease,   also one tiny acute stroke involving corpus callosum, which would not explain her current symptoms, her confusion episode has much improved with Keppra 500 twice a day, EEG was normal,     Complex partial seizure   Keep Keppra 500 mg twice a day  Dementia slow worsening, Mini-Mental Status Examination  22/30  Add on seroquel 25 mg every night for her agitations    Marcial Pacas, M.D. Ph.D.  [facility] [address] [place], [place] [address] [phone]

## 2016-09-28 ENCOUNTER — Encounter (HOSPITAL_COMMUNITY): Payer: Self-pay | Admitting: Emergency Medicine

## 2016-09-28 ENCOUNTER — Emergency Department (HOSPITAL_COMMUNITY)
Admission: EM | Admit: 2016-09-28 | Discharge: 2016-09-28 | Disposition: A | Discharge status: Home / Self Care | Payer: Medicare Other | Attending: Emergency Medicine | Admitting: Emergency Medicine

## 2016-09-28 ENCOUNTER — Emergency Department (HOSPITAL_COMMUNITY): Payer: Medicare Other

## 2016-09-28 DIAGNOSIS — E039 Hypothyroidism, unspecified: Secondary | ICD-10-CM | POA: Diagnosis not present

## 2016-09-28 DIAGNOSIS — J40 Bronchitis, not specified as acute or chronic: Secondary | ICD-10-CM | POA: Diagnosis not present

## 2016-09-28 DIAGNOSIS — R071 Chest pain on breathing: Secondary | ICD-10-CM

## 2016-09-28 DIAGNOSIS — Z79899 Other long term (current) drug therapy: Secondary | ICD-10-CM | POA: Insufficient documentation

## 2016-09-28 DIAGNOSIS — I2510 Atherosclerotic heart disease of native coronary artery without angina pectoris: Secondary | ICD-10-CM | POA: Insufficient documentation

## 2016-09-28 DIAGNOSIS — E119 Type 2 diabetes mellitus without complications: Secondary | ICD-10-CM | POA: Diagnosis not present

## 2016-09-28 DIAGNOSIS — M25512 Pain in left shoulder: Secondary | ICD-10-CM | POA: Diagnosis not present

## 2016-09-28 DIAGNOSIS — I10 Essential (primary) hypertension: Secondary | ICD-10-CM | POA: Insufficient documentation

## 2016-09-28 DIAGNOSIS — R05 Cough: Secondary | ICD-10-CM | POA: Diagnosis present

## 2016-09-28 HISTORY — DX: Unspecified abnormalities of gait and mobility: R26.9

## 2016-09-28 HISTORY — DX: Other chronic pain: G89.29

## 2016-09-28 HISTORY — DX: Dorsalgia, unspecified: M54.9

## 2016-09-28 HISTORY — DX: Pain in unspecified hip: M25.559

## 2016-09-28 HISTORY — DX: Sciatica, unspecified side: M54.30

## 2016-09-28 HISTORY — DX: Unspecified dementia, unspecified severity, without behavioral disturbance, psychotic disturbance, mood disturbance, and anxiety: F03.90

## 2016-09-28 LAB — CBC WITH DIFFERENTIAL/PLATELET
BASOS ABS: 0.1 10*3/uL (ref 0.0–0.1)
Basophils Relative: 1 %
EOS ABS: 0.6 10*3/uL (ref 0.0–0.7)
EOS PCT: 4 %
HCT: 40.2 % (ref 36.0–46.0)
Hemoglobin: 13.3 g/dL (ref 12.0–15.0)
LYMPHS ABS: 2.3 10*3/uL (ref 0.7–4.0)
Lymphocytes Relative: 16 %
MCH: 29.8 pg (ref 26.0–34.0)
MCHC: 33.1 g/dL (ref 30.0–36.0)
MCV: 89.9 fL (ref 78.0–100.0)
Monocytes Absolute: 2.4 10*3/uL — ABNORMAL HIGH (ref 0.1–1.0)
Monocytes Relative: 16 %
Neutro Abs: 9.2 10*3/uL — ABNORMAL HIGH (ref 1.7–7.7)
Neutrophils Relative %: 63 %
PLATELETS: 345 10*3/uL (ref 150–400)
RBC: 4.47 MIL/uL (ref 3.87–5.11)
RDW: 13.1 % (ref 11.5–15.5)
WBC: 14.6 10*3/uL — AB (ref 4.0–10.5)

## 2016-09-28 LAB — BASIC METABOLIC PANEL
Anion gap: 7 (ref 5–15)
BUN: 19 mg/dL (ref 6–20)
CALCIUM: 9.2 mg/dL (ref 8.9–10.3)
CO2: 30 mmol/L (ref 22–32)
CREATININE: 1.1 mg/dL — AB (ref 0.44–1.00)
Chloride: 95 mmol/L — ABNORMAL LOW (ref 101–111)
GFR, EST AFRICAN AMERICAN: 53 mL/min — AB (ref >60)
GFR, EST NON AFRICAN AMERICAN: 45 mL/min — AB (ref >60)
Glucose, Bld: 134 mg/dL — ABNORMAL HIGH (ref 65–99)
Potassium: 4 mmol/L (ref 3.5–5.1)
SODIUM: 132 mmol/L — AB (ref 135–145)

## 2016-09-28 LAB — TROPONIN I

## 2016-09-28 MED ORDER — DOXYCYCLINE HYCLATE 100 MG PO TABS
100.0000 mg | ORAL_TABLET | Freq: Once | ORAL | Status: AC
  Administered 2016-09-28: 100 mg via ORAL
  Filled 2016-09-28: qty 1

## 2016-09-28 MED ORDER — DOXYCYCLINE HYCLATE 100 MG PO CAPS: 100.0000 mg | ORAL_CAPSULE | Freq: Two times a day (BID) | ORAL | 0 refills | Status: DC

## 2016-09-28 NOTE — ED Notes (Signed)
ED Provider at bedside. 

## 2016-09-28 NOTE — ED Triage Notes (Signed)
Chest pain since last night went away - pain returned after trying to get out of bed Dr Edrick Oh

## 2016-09-28 NOTE — ED Provider Notes (Signed)
[facility] DEPT Provider Note   CSN: [id number] Arrival date & time: 09/28/16  2031   By signing my name below, I, Katie Ford, attest that this documentation has been prepared under the direction and in the presence of Katie Schmidt, MD. Electronically Signed: Charolotte Ford, Scribe. 09/28/16. 8:57 PM.    History   Chief Complaint Chief Complaint  Patient presents with  . Chest Pain    since last night    HPI Katie Ford is a 83 y.o. female with h/o dementia, Dm, HTN, CAD who presents to the Emergency Department complaining of intermittent, waxing and waning, grabbing left shoulder pain that has lasted all day but began yesterday. Pain does not radiate anywhere. The pain is exacerbated by movement and standing. Pt has associated cough x3 days. Per family, pain under left breast when trying to get up from chair. Pt is on prednisone and reacted to medication by swelling. Pt denies SOB, fever.   HPI  Past Medical History:  Diagnosis Date  . Asthmatic bronchitis   . CAD (coronary artery disease)    bare metal stents to 2 separate RCA lesions August, 2011, residual 60% LAD, medical therapy  /  plan was for effient for one month followed by Plavix,, but patient does not tolerate Plavix... therefore patient placed back on effient  . Carotid artery disease ([place])   . Chronic back pain   . Chronic hip pain   . Dementia   . Diabetes mellitus    does not have  . Diverticulitis    remote hx  . Edema 03/29/2010   September, 2011  . Ejection fraction    EF 65 %... normal... catheterization 02/2010.  . Fracture of rib of left side 04/24/2011  . Gait abnormality   . GERD (gastroesophageal reflux disease)   . Hip fracture [facility])    October, 2012  . Hyperglycemia 04/24/2011  . Hypertension   . Hypothyroidism 04/24/2011  . Intolerance of drug    aspirin and plavix  . Mitral regurgitation    mild, echo, June, 2007  . Obesity   . Paroxysmal supraventricular tachycardia (HCC)    infrequent episodes over the years..palpitations.  . Sciatica     Patient Active Problem List   Diagnosis Date Noted  . Dementia with behavioral problem 09/11/2016  . Lumbar stenosis 09/11/2015  . Complex partial seizure ([place]) 09/02/2013  . Subacute confusional state 07/30/2013  . Carotid artery disease ([place])   . Diverticulosis 03/06/2012  . Abnormal CT of the abdomen 11/25/2011  . Abdominal pain, unspecified site 11/25/2011  . Ejection fraction   . Intolerance of drug   . Hip fracture ([place])   . UTI (urinary tract infection) 04/25/2011  . Leukocytosis 04/24/2011  . Hyponatremia 04/24/2011  . Fracture of left hip ([place]) 04/24/2011  . Asthmatic bronchitis 04/24/2011  . Anemia 04/24/2011  . Hypothyroidism 04/24/2011  . DM2 (diabetes mellitus, type 2) ([place]) 04/24/2011  . Fracture of rib of left side 04/24/2011  . Mitral regurgitation   . Paroxysmal supraventricular tachycardia ([place])   . Hypertension   . Asthmatic bronchitis   . Obesity   . GERD (gastroesophageal reflux disease)   . CAD (coronary artery disease)   . Edema 03/29/2010    Past Surgical History:  Procedure Laterality Date  . ANKLE FRACTURE SURGERY  12/1993   [place]  . ARTHROPLASTY W/ ARTHROSCOPY MEDIAL / LATERAL COMPARTMENT KNEE     [facility], should read arthroscopy not arthroplasty  . CARDIAC CATHETERIZATION  02/22/2010  2 stents, [place]  . ESOPHAGOGASTRODUODENOSCOPY  12/06/11   fields: Negative H. pylori, hiatal hernia/moderate gastritis  . ileocolonscopy  12/06/11   fields:diverticula throughtout the colon/inertnal hemorrhoids/  . KNEE ARTHROSCOPY  11/2001   left knee  . KNEE ARTHROSCOPY  02/19/2011   Procedure: ARTHROSCOPY KNEE;  Surgeon: Sanjuana Kava;  Location: [facility] ORS;  Service: Orthopedics;  Laterality: Right;  Medial and Lateral Partial Menisectomy  . LAPAROSCOPIC CHOLECYSTECTOMY  04/29/1996   [place] [facility]  . ORIF HIP FRACTURE  04/25/2011   Procedure: OPEN REDUCTION INTERNAL FIXATION HIP;  Surgeon: Sanjuana Kava;   Location: [facility] ORS;  Service: Orthopedics;  Laterality: Left;  . SP ARTHRO THUMB*R*  05/1989   [place]  . TOTAL ABDOMINAL HYSTERECTOMY  1983   [place]  . TUBAL LIGATION  09/1970    OB History    Gravida Para Term Preterm AB Living   6 6 6          SAB TAB Ectopic Multiple Live Births                   Home Medications    Prior to Admission medications   Medication Sig Start Date End Date Taking? Authorizing Provider  acetaminophen (TYLENOL) 500 MG tablet Take 1,000 mg by mouth every 6 (six) hours as needed for mild pain.    Historical Provider, MD  ADVAIR DISKUS 250-50 MCG/DOSE AEPB Inhale 1 puff into the lungs every morning. 04/06/13   Historical Provider, MD  albuterol (PROVENTIL) (2.5 MG/3ML) 0.083% nebulizer solution Take 2.5 mg by nebulization daily as needed. 12/12/15   Historical Provider, MD  ALPRAZolam Duanne Moron) 0.5 MG tablet Take 0.25-0.5 mg by mouth daily as needed. TAKE HALF TO ONE  A TABLET IF NEEDED FOR ANXIETY    Historical Provider, MD  amLODipine (NORVASC) 10 MG tablet Take 10 mg by mouth every morning.     Historical Provider, MD  BREO ELLIPTA 100-25 MCG/INH AEPB Inhale 1 puff into the lungs daily. 12/04/15   Historical Provider, MD  cetirizine (ZYRTEC) 10 MG tablet Take 5 mg by mouth 2 (two) times daily.     Historical Provider, MD  citalopram (CELEXA) 10 MG tablet Take 2 tablets (20 mg total) by mouth daily. 09/11/16   Marcial Pacas, MD  esomeprazole (NEXIUM) 40 MG capsule TAKE 1 CAPSULE (40 MG TOTAL)  BY MOUTH DAILY BEFORE BREAKFAST. 01/24/14   Danie Binder, MD  fluticasone (FLONASE) 50 MCG/ACT nasal spray Place 1 spray into both nostrils daily as needed for allergies.  06/25/13   Historical Provider, MD  fluticasone (FLOVENT HFA) 44 MCG/ACT inhaler Inhale 1 puff into the lungs 2 (two) times daily as needed (Shortness of Breath).     Historical Provider, MD  levETIRAcetam (KEPPRA) 500 MG tablet Take 1 tablet (500 mg total) by mouth 2 (two) times daily. 09/11/16   Marcial Pacas, MD    levothyroxine (SYNTHROID, LEVOTHROID) 88 MCG tablet Take 88 mcg by mouth daily before breakfast.    Historical Provider, MD  losartan (COZAAR) 25 MG tablet Take 25 mg by mouth daily.  08/26/13   Historical Provider, MD  metoprolol succinate (TOPROL-XL) 100 MG 24 hr tablet Take 100 mg by mouth daily. Take with or immediately following a meal.    Historical Provider, MD  mometasone (NASONEX) 50 MCG/ACT nasal spray Place 2 sprays into the nose daily as needed (Congestion).     Historical Provider, MD  nitroGLYCERIN (NITROSTAT) 0.4 MG SL tablet Place 1 tablet (0.4 mg total) under the  tongue every 5 (five) minutes as needed for chest pain. 12/26/15   Herminio Commons, MD  polyethylene glycol (MIRALAX / GLYCOLAX) packet Take 17 g by mouth daily as needed for mild constipation.     Historical Provider, MD  Polyvinyl Alcohol-Povidone (MURINE TEARS FOR DRY EYES) 5-6 MG/ML SOLN Place 1 drop into both eyes every morning. As needed    Historical Provider, MD  prasugrel (EFFIENT) 5 MG TABS tablet Take 1 tablet (5 mg total) by mouth daily. 03/09/15   Carlena Bjornstad, MD  traMADol (ULTRAM) 50 MG tablet Take 1 tablet (50 mg total) by mouth every 12 (twelve) hours as needed. for pain 04/28/16   Sanjuana Kava, MD    Family History Family History  Problem Relation Age of Onset  . Diabetes Mother   . Arthritis Mother   . Heart attack Father   . Stroke Father   . Emphysema Father   . Cataracts Father   . Cancer Sister   . Heart disease Sister   . Arthritis Sister   . Arthritis Brother   . Allergies Brother   . Colon cancer Neg Hx     Social History Social History  Substance Use Topics  . Smoking status: Never Smoker  . Smokeless tobacco: Never Used  . Alcohol use No     Comment: rarely     Allergies   Celecoxib; Codeine; Hydrocodone; Tartrazine; and Aspirin   Review of Systems Review of Systems  A complete 10 system review of systems was obtained and all systems are negative except as noted in  the HPI and PMH.    Physical Exam Updated Vital Signs BP (!) 130/106   Pulse 81   Temp 99.3 F (37.4 C) (Oral)   Resp 20   Ht 5\' 2"  (1.575 m)   Wt 159 lb (72.1 kg)   SpO2 100%   BMI 29.08 kg/m   Physical Exam  Constitutional: She is oriented to person, place, and time. She appears well-developed and well-nourished. No distress.  HENT:  Head: Normocephalic and atraumatic.  Eyes: EOM are normal.  Neck: Normal range of motion.  Cardiovascular: Normal rate, regular rhythm and normal heart sounds.   Pulmonary/Chest: Effort normal and breath sounds normal.  Abdominal: Soft. She exhibits no distension. There is no tenderness.  Musculoskeletal: Normal range of motion.  Neurological: She is alert and oriented to person, place, and time.  Skin: Skin is warm and dry.  Psychiatric: She has a normal mood and affect. Judgment normal.  Nursing note and vitals reviewed.    ED Treatments / Results   DIAGNOSTIC STUDIES: Oxygen Saturation is 100% on room air, normal by my interpretation.    COORDINATION OF CARE: 8:55 PM Discussed treatment plan with pt at bedside and pt agreed to plan, which include chest Xr.    Labs (all labs ordered are listed, but only abnormal results are displayed) Labs Reviewed  CBC WITH DIFFERENTIAL/PLATELET - Abnormal; Notable for the following:       Result Value   WBC 14.6 (*)    Neutro Abs 9.2 (*)    Monocytes Absolute 2.4 (*)    All other components within normal limits  BASIC METABOLIC PANEL - Abnormal; Notable for the following:    Sodium 132 (*)    Chloride 95 (*)    Glucose, Bld 134 (*)    Creatinine, Ser 1.10 (*)    GFR calc non Af Amer 45 (*)    GFR calc Af Amer 53 (*)  All other components within normal limits  TROPONIN I    EKG  EKG Interpretation  Date/Time:  Saturday September 28 2016 20:43:11 EST Ventricular Rate:  73 PR Interval:    QRS Duration: 128 QT Interval:  458 QTC Calculation: 477 R Axis:   -42 Text Interpretation:   Sinus rhythm Atrial premature complexes Nonspecific IVCD with LAD No significant change was found Interpretation limited secondary to artifact Confirmed by Sharrell Krawiec  MD, Lennette Bihari ([id number]) on 09/28/2016 8:54:18 PM       Radiology Dg Chest Portable 1 View  Result Date: 09/28/2016 CLINICAL DATA:  Chest pain. EXAM: PORTABLE CHEST 1 VIEW COMPARISON:  Two-view chest x-ray 01/13/2016. FINDINGS: The heart size is exaggerated by low lung volumes. Atherosclerotic calcifications are present at the aortic arch. There is no edema or effusion to suggest failure. Minimal bibasilar airspace disease likely reflects atelectasis. Degenerative changes are present at the shoulders. IMPRESSION: 1. Mild cardiomegaly without failure. 2. Low lung volumes. 3. Aortic atherosclerosis. 4. No acute cardiopulmonary disease. Electronically Signed   By: San Morelle M.D.   On: 09/28/2016 20:15    Procedures Procedures (including critical care time)  Medications Ordered in ED Medications  doxycycline (VIBRA-TABS) tablet 100 mg (not administered)     Initial Impression / Assessment and Plan / ED Course  I have reviewed the triage vital signs and the nursing notes.  Pertinent labs & imaging results that were available during my care of the patient were reviewed by me and considered in my medical decision making (see chart for details).     9:50 PM Workup in emergency department is without significant abnormality.  Given the patient's cough reported fever and pleuritic left-sided pain this could represent early developing left lower lobe pneumonia.  There is some obscuring of the left heart border on x-ray.  Patient be started on doxycycline.  She understands to return the ER for new or worsening symptoms.  Vital signs are stable.  No hypoxia.  Discharge home in good condition.  Primary care follow-up  Final Clinical Impressions(s) / ED Diagnoses   Final diagnoses:  Chest pain on breathing  Bronchitis  Acute pain of  left shoulder    New Prescriptions New Prescriptions   DOXYCYCLINE (VIBRAMYCIN) 100 MG CAPSULE    Take 1 capsule (100 mg total) by mouth 2 (two) times daily.   I personally performed the services described in this documentation, which was scribed in my presence. The recorded information has been reviewed and is accurate.        Katie Schmidt, MD 09/28/16 2150

## 2016-09-28 NOTE — ED Notes (Signed)
EKG done and seen by EDP. Patient on cardiac monitoring.

## 2016-10-07 ENCOUNTER — Ambulatory Visit (HOSPITAL_COMMUNITY)
Admission: RE | Admit: 2016-10-07 | Discharge: 2016-10-07 | Disposition: A | Discharge status: Home / Self Care | Payer: Medicare Other | Source: Ambulatory Visit | Attending: Adult Health Nurse Practitioner | Admitting: Adult Health Nurse Practitioner

## 2016-10-07 ENCOUNTER — Other Ambulatory Visit (HOSPITAL_COMMUNITY): Payer: Self-pay | Admitting: Adult Health Nurse Practitioner

## 2016-10-07 DIAGNOSIS — M19011 Primary osteoarthritis, right shoulder: Secondary | ICD-10-CM | POA: Diagnosis not present

## 2016-10-07 DIAGNOSIS — M25511 Pain in right shoulder: Secondary | ICD-10-CM

## 2016-10-11 ENCOUNTER — Telehealth (HOSPITAL_COMMUNITY): Payer: Self-pay | Admitting: *Deleted

## 2016-10-11 NOTE — Telephone Encounter (Signed)
voice message to cancel appointment.  Returned phone call and patient's sister answered.   She said they had a death in the family.  Their sister passed.

## 2016-10-15 ENCOUNTER — Ambulatory Visit (HOSPITAL_COMMUNITY): Payer: Medicare Other | Admitting: Psychiatry

## 2016-10-15 ENCOUNTER — Ambulatory Visit (HOSPITAL_COMMUNITY): Payer: Self-pay | Admitting: Psychiatry

## 2016-12-09 ENCOUNTER — Emergency Department (HOSPITAL_COMMUNITY)
Admission: EM | Admit: 2016-12-09 | Discharge: 2016-12-09 | Disposition: A | Discharge status: Home / Self Care | Payer: Medicare Other | Attending: Emergency Medicine | Admitting: Emergency Medicine

## 2016-12-09 ENCOUNTER — Emergency Department (HOSPITAL_COMMUNITY): Payer: Medicare Other

## 2016-12-09 ENCOUNTER — Encounter (HOSPITAL_COMMUNITY): Payer: Self-pay | Admitting: *Deleted

## 2016-12-09 DIAGNOSIS — Y92009 Unspecified place in unspecified non-institutional (private) residence as the place of occurrence of the external cause: Secondary | ICD-10-CM | POA: Insufficient documentation

## 2016-12-09 DIAGNOSIS — E039 Hypothyroidism, unspecified: Secondary | ICD-10-CM | POA: Insufficient documentation

## 2016-12-09 DIAGNOSIS — S0990XA Unspecified injury of head, initial encounter: Secondary | ICD-10-CM | POA: Diagnosis present

## 2016-12-09 DIAGNOSIS — I10 Essential (primary) hypertension: Secondary | ICD-10-CM | POA: Diagnosis not present

## 2016-12-09 DIAGNOSIS — Y999 Unspecified external cause status: Secondary | ICD-10-CM | POA: Diagnosis not present

## 2016-12-09 DIAGNOSIS — W19XXXA Unspecified fall, initial encounter: Secondary | ICD-10-CM | POA: Insufficient documentation

## 2016-12-09 DIAGNOSIS — E119 Type 2 diabetes mellitus without complications: Secondary | ICD-10-CM | POA: Insufficient documentation

## 2016-12-09 DIAGNOSIS — Y939 Activity, unspecified: Secondary | ICD-10-CM | POA: Insufficient documentation

## 2016-12-09 DIAGNOSIS — I2510 Atherosclerotic heart disease of native coronary artery without angina pectoris: Secondary | ICD-10-CM | POA: Insufficient documentation

## 2016-12-09 DIAGNOSIS — Z79899 Other long term (current) drug therapy: Secondary | ICD-10-CM | POA: Diagnosis not present

## 2016-12-09 DIAGNOSIS — S0083XA Contusion of other part of head, initial encounter: Secondary | ICD-10-CM | POA: Insufficient documentation

## 2016-12-09 LAB — BASIC METABOLIC PANEL
ANION GAP: 8 (ref 5–15)
BUN: 25 mg/dL — ABNORMAL HIGH (ref 6–20)
CHLORIDE: 100 mmol/L — AB (ref 101–111)
CO2: 28 mmol/L (ref 22–32)
CREATININE: 1.13 mg/dL — AB (ref 0.44–1.00)
Calcium: 9 mg/dL (ref 8.9–10.3)
GFR calc non Af Amer: 44 mL/min — ABNORMAL LOW (ref >60)
GFR, EST AFRICAN AMERICAN: 51 mL/min — AB (ref >60)
Glucose, Bld: 137 mg/dL — ABNORMAL HIGH (ref 65–99)
Potassium: 3.8 mmol/L (ref 3.5–5.1)
SODIUM: 136 mmol/L (ref 135–145)

## 2016-12-09 LAB — CBC WITH DIFFERENTIAL/PLATELET
BASOS PCT: 1 %
Basophils Absolute: 0.1 10*3/uL (ref 0.0–0.1)
EOS ABS: 0.7 10*3/uL (ref 0.0–0.7)
Eosinophils Relative: 7 %
HCT: 35.5 % — ABNORMAL LOW (ref 36.0–46.0)
HEMOGLOBIN: 11.6 g/dL — AB (ref 12.0–15.0)
LYMPHS ABS: 1.7 10*3/uL (ref 0.7–4.0)
Lymphocytes Relative: 18 %
MCH: 28.2 pg (ref 26.0–34.0)
MCHC: 32.7 g/dL (ref 30.0–36.0)
MCV: 86.2 fL (ref 78.0–100.0)
MONOS PCT: 13 %
Monocytes Absolute: 1.2 10*3/uL — ABNORMAL HIGH (ref 0.1–1.0)
NEUTROS ABS: 5.8 10*3/uL (ref 1.7–7.7)
NEUTROS PCT: 61 %
Platelets: 378 10*3/uL (ref 150–400)
RBC: 4.12 MIL/uL (ref 3.87–5.11)
RDW: 13.6 % (ref 11.5–15.5)
WBC: 9.5 10*3/uL (ref 4.0–10.5)

## 2016-12-09 MED ORDER — ACETAMINOPHEN 325 MG PO TABS
650.0000 mg | ORAL_TABLET | Freq: Once | ORAL | Status: AC
  Administered 2016-12-09: 650 mg via ORAL
  Filled 2016-12-09: qty 2

## 2016-12-09 NOTE — ED Notes (Signed)
nad noted prior to dc. Dc instructions reviewed and explained to pt/family.

## 2016-12-09 NOTE — ED Provider Notes (Signed)
[facility] DEPT Provider Note   CSN: [id number] Arrival date & time: 12/09/16  0455  LEVEL 5 CAVEAT - DEMENTIA   History   Chief Complaint Chief Complaint  Patient presents with  . Fall    HPI [place] B Stagner is a 83 y.o. female.  HPI  83 year old brought in by EMS after a fall. EMS was gone prior to me evaluating the patient. EMS tells the nurse that the patient fell in the house, family presumes she tripped. The sister when she arrived tells me that she heard the patient fall and made a loud thud when she hit her head on the ground. Rest out to see her and the patient was awake and alert. No evident loss of consciousness. Patient is on a blood thinner for coronary disease. She currently feels a little dizzy after the fall but denies any preceding dizziness. However the patient tells me a different story that she was at church getting ready for school. Sister states that she has been at her mental baseline recently which includes good days and bad but she has been diagnosed with dementia. Patient also complaining of some right shoulder/upper arm pain. Sister thinks that the patient couldn't see because of how dark it was then bumped into something causing her to fall.  Past Medical History:  Diagnosis Date  . Asthmatic bronchitis   . CAD (coronary artery disease)    bare metal stents to 2 separate RCA lesions August, 2011, residual 60% LAD, medical therapy  /  plan was for effient for one month followed by Plavix,, but patient does not tolerate Plavix... therefore patient placed back on effient  . Carotid artery disease ([place])   . Chronic back pain   . Chronic hip pain   . Dementia   . Diabetes mellitus    does not have  . Diverticulitis    remote hx  . Edema 03/29/2010   September, 2011  . Ejection fraction    EF 65 %... normal... catheterization 02/2010.  . Fracture of rib of left side 04/24/2011  . Gait abnormality   . GERD (gastroesophageal reflux disease)   . Hip  fracture [facility])    October, 2012  . Hyperglycemia 04/24/2011  . Hypertension   . Hypothyroidism 04/24/2011  . Intolerance of drug    aspirin and plavix  . Mitral regurgitation    mild, echo, June, 2007  . Obesity   . Paroxysmal supraventricular tachycardia (HCC)    infrequent episodes over the years..palpitations.  . Sciatica     Patient Active Problem List   Diagnosis Date Noted  . Dementia with behavioral problem 09/11/2016  . Lumbar stenosis 09/11/2015  . Complex partial seizure ([place]) 09/02/2013  . Subacute confusional state 07/30/2013  . Carotid artery disease ([place])   . Diverticulosis 03/06/2012  . Abnormal CT of the abdomen 11/25/2011  . Abdominal pain, unspecified site 11/25/2011  . Ejection fraction   . Intolerance of drug   . Hip fracture ([place])   . UTI (urinary tract infection) 04/25/2011  . Leukocytosis 04/24/2011  . Hyponatremia 04/24/2011  . Fracture of left hip ([place]) 04/24/2011  . Asthmatic bronchitis 04/24/2011  . Anemia 04/24/2011  . Hypothyroidism 04/24/2011  . DM2 (diabetes mellitus, type 2) ([place]) 04/24/2011  . Fracture of rib of left side 04/24/2011  . Mitral regurgitation   . Paroxysmal supraventricular tachycardia ([place])   . Hypertension   . Asthmatic bronchitis   . Obesity   . GERD (gastroesophageal reflux disease)   .  CAD (coronary artery disease)   . Edema 03/29/2010    Past Surgical History:  Procedure Laterality Date  . ANKLE FRACTURE SURGERY  12/1993   [place]  . ARTHROPLASTY W/ ARTHROSCOPY MEDIAL / LATERAL COMPARTMENT KNEE     [facility], should read arthroscopy not arthroplasty  . CARDIAC CATHETERIZATION  02/22/2010   2 stents, [place]  . ESOPHAGOGASTRODUODENOSCOPY  12/06/11   fields: Negative H. pylori, hiatal hernia/moderate gastritis  . ileocolonscopy  12/06/11   fields:diverticula throughtout the colon/inertnal hemorrhoids/  . KNEE ARTHROSCOPY  11/2001   left knee  . KNEE ARTHROSCOPY  02/19/2011   Procedure: ARTHROSCOPY KNEE;  Surgeon: Sanjuana Kava;  Location: [facility];  Service: Orthopedics;  Laterality: Right;  Medial and Lateral Partial Menisectomy  . LAPAROSCOPIC CHOLECYSTECTOMY  04/29/1996   [place] [facility]  . ORIF HIP FRACTURE  04/25/2011   Procedure: OPEN REDUCTION INTERNAL FIXATION HIP;  Surgeon: Sanjuana Kava;  Location: [facility] ORS;  Service: Orthopedics;  Laterality: Left;  . SP ARTHRO THUMB*R*  05/1989   [place]  . TOTAL ABDOMINAL HYSTERECTOMY  1983   [place]  . TUBAL LIGATION  09/1970    OB History    Gravida Para Term Preterm AB Living   6 6 6          SAB TAB Ectopic Multiple Live Births                   Home Medications    Prior to Admission medications   Medication Sig Start Date End Date Taking? Authorizing Provider  acetaminophen (TYLENOL) 500 MG tablet Take 1,000 mg by mouth every 6 (six) hours as needed for mild pain.    [provider]  ADVAIR DISKUS 250-50 MCG/DOSE AEPB Inhale 1 puff into the lungs every morning. 04/06/13   [provider]  albuterol (PROVENTIL) (2.5 MG/3ML) 0.083% nebulizer solution Take 2.5 mg by nebulization daily as needed. 12/12/15   [provider]  ALPRAZolam Duanne Moron) 0.5 MG tablet Take 0.25-0.5 mg by mouth daily as needed. TAKE HALF TO ONE  A TABLET IF NEEDED FOR ANXIETY    [provider]  amLODipine (NORVASC) 10 MG tablet Take 10 mg by mouth every morning.     [provider]  BREO ELLIPTA 100-25 MCG/INH AEPB Inhale 1 puff into the lungs daily. 12/04/15   [provider]  cetirizine (ZYRTEC) 10 MG tablet Take 5 mg by mouth 2 (two) times daily.     [provider]  citalopram (CELEXA) 10 MG tablet Take 2 tablets (20 mg total) by mouth daily. 09/11/16   Marcial Pacas, MD  doxycycline (VIBRAMYCIN) 100 MG capsule Take 1 capsule (100 mg total) by mouth 2 (two) times daily. 09/28/16   [name], MD  esomeprazole (NEXIUM) 40 MG capsule TAKE 1 CAPSULE (40 MG TOTAL)  BY MOUTH DAILY BEFORE BREAKFAST. 01/24/14   Fields, Marga Melnick, MD    fluticasone (FLONASE) 50 MCG/ACT nasal spray Place 1 spray into both nostrils daily as needed for allergies.  06/25/13   [provider]  fluticasone (FLOVENT HFA) 44 MCG/ACT inhaler Inhale 1 puff into the lungs 2 (two) times daily as needed (Shortness of Breath).     [provider]  levETIRAcetam (KEPPRA) 500 MG tablet Take 1 tablet (500 mg total) by mouth 2 (two) times daily. 09/11/16   Marcial Pacas, MD  levothyroxine (SYNTHROID, LEVOTHROID) 88 MCG tablet Take 88 mcg by mouth daily before breakfast.    [provider]  losartan (COZAAR) 25 MG  tablet Take 25 mg by mouth daily.  08/26/13   [provider]  metoprolol succinate (TOPROL-XL) 100 MG 24 hr tablet Take 100 mg by mouth daily. Take with or immediately following a meal.    [provider]  mometasone (NASONEX) 50 MCG/ACT nasal spray Place 2 sprays into the nose daily as needed (Congestion).     [provider]  nitroGLYCERIN (NITROSTAT) 0.4 MG SL tablet Place 1 tablet (0.4 mg total) under the tongue every 5 (five) minutes as needed for chest pain. 12/26/15   Herminio Commons, MD  polyethylene glycol (MIRALAX / GLYCOLAX) packet Take 17 g by mouth daily as needed for mild constipation.     [provider]  Polyvinyl Alcohol-Povidone (MURINE TEARS FOR DRY EYES) 5-6 MG/ML SOLN Place 1 drop into both eyes every morning. As needed    [provider]  prasugrel (EFFIENT) 5 MG TABS tablet Take 1 tablet (5 mg total) by mouth daily. 03/09/15   Carlena Bjornstad, MD  traMADol (ULTRAM) 50 MG tablet Take 1 tablet (50 mg total) by mouth every 12 (twelve) hours as needed. for pain 04/28/16   Sanjuana Kava, MD    Family History Family History  Problem Relation Age of Onset  . Diabetes Mother   . Arthritis Mother   . Heart attack Father   . Stroke Father   . Emphysema Father   . Cataracts Father   . Cancer Sister   . Heart disease Sister   . Arthritis Sister   . Arthritis Brother    . Allergies Brother   . Colon cancer Neg Hx     Social History Social History  Substance Use Topics  . Smoking status: Never Smoker  . Smokeless tobacco: Never Used  . Alcohol use No     Comment: rarely     Allergies   Celecoxib; Codeine; Hydrocodone; Tartrazine; and Aspirin   Review of Systems Review of Systems  Unable to perform ROS: Dementia     Physical Exam Updated Vital Signs BP 110/64   Pulse 69   Temp 98.3 F (36.8 C) (Oral)   Resp (!) 26   Ht 5\' 2"  (1.575 m)   Wt 159 lb (72.1 kg)   SpO2 95%   BMI 29.08 kg/m   Physical Exam  Constitutional: She appears well-developed and well-nourished.  HENT:  Head: Normocephalic. Head is with abrasion and with contusion.    Right Ear: External ear normal.  Left Ear: External ear normal.  Nose: Nose normal.  Eyes: EOM are normal. Pupils are equal, round, and reactive to light. Right eye exhibits no discharge. Left eye exhibits no discharge.  Neck: No spinous process tenderness and no muscular tenderness present. Normal range of motion present.  Cardiovascular: Normal rate, regular rhythm and normal heart sounds.   Pulses:      Radial pulses are 2+ on the right side, and 2+ on the left side.  Pulmonary/Chest: Effort normal and breath sounds normal.  Abdominal: Soft. There is no tenderness.  Musculoskeletal:       Right shoulder: She exhibits tenderness (mild). She exhibits normal range of motion, no deformity, normal pulse and normal strength.       Right hip: She exhibits normal range of motion and no tenderness.       Left hip: She exhibits normal range of motion and no tenderness.       Right upper arm: She exhibits tenderness (proximal).  Neurological: She is alert. She is disoriented.  GCS eye subscore is 4. GCS verbal subscore is 4. GCS motor subscore is 6.  CN 3-12 grossly intact. 5/5 strength in all 4 extremities. Grossly normal sensation. Normal finger to nose.   Skin: Skin is warm and dry. She is not  diaphoretic.  Nursing note and vitals reviewed.    ED Treatments / Results  Labs (all labs ordered are listed, but only abnormal results are displayed) Labs Reviewed  BASIC METABOLIC PANEL - Abnormal; Notable for the following:       Result Value   Chloride 100 (*)    Glucose, Bld 137 (*)    BUN 25 (*)    Creatinine, Ser 1.13 (*)    GFR calc non Af Amer 44 (*)    GFR calc Af Amer 51 (*)    All other components within normal limits  CBC WITH DIFFERENTIAL/PLATELET - Abnormal; Notable for the following:    Hemoglobin 11.6 (*)    HCT 35.5 (*)    Monocytes Absolute 1.2 (*)    All other components within normal limits    EKG  EKG Interpretation  Date/Time:  Monday Dec 09 2016 05:09:40 EDT Ventricular Rate:  70 PR Interval:    QRS Duration: 101 QT Interval:  426 QTC Calculation: 460 R Axis:   -21 Text Interpretation:  Sinus rhythm Borderline left axis deviation Low voltage, precordial leads no significant change since Mar 2018 Confirmed by Sherwood Gambler [phone]) on 12/09/2016 5:18:36 AM       Radiology Dg Shoulder Right  Result Date: 12/09/2016 CLINICAL DATA:  Fall at home, right shoulder pain. EXAM: RIGHT SHOULDER - 2+ VIEW COMPARISON:  Radiographs 10/07/2016 FINDINGS: No acute fracture or subluxation. Superior subluxation of the humeral head abutting the undersurface of the acromion consistent with rotator cuff pathology. Acromioclavicular joint is congruent with mild degenerative change. Glenohumeral osteoarthritis is stable. The bones are under mineralized. IMPRESSION: No acute fracture or subluxation. Findings consistent with chronic rotator cuff tear and osteoarthritis. Electronically Signed   By: Jeb Levering M.D.   On: 12/09/2016 06:09   Ct Head Wo Contrast  Result Date: 12/09/2016 CLINICAL DATA:  Fall with right-sided forehead swelling. EXAM: CT HEAD WITHOUT CONTRAST TECHNIQUE: Contiguous axial images were obtained from the base of the skull through the vertex  without intravenous contrast. COMPARISON:  Brain MRI 07/03/2013 FINDINGS: Brain: No mass lesion, intraparenchymal hemorrhage or extra-axial collection. No evidence of acute cortical infarct. There is periventricular hypoattenuation compatible with chronic microvascular disease. There are multiple old left basal ganglia lacunar infarcts. Vascular: No hyperdense vessel or unexpected calcification. Skull: Large right frontal scalp subgaleal hematoma. No skull fracture. Sinuses/Orbits: Fluid level in the right maxillary sinus. Near complete opacification of the bilateral ethmoid sinuses, left maxillary sinus and right sphenoid sinus. Normal orbits. IMPRESSION: 1. Chronic microvascular ischemia and multiple old infarcts without acute intracranial abnormality. 2. Large right frontal scalp hematoma without underlying skull fracture. 3. Severe chronic paranasal sinus disease. Fluid level in the right maxillary sinus could indicate superimposed acute sinusitis. Correlate for associated symptoms. Electronically Signed   By: Ulyses Jarred M.D.   On: 12/09/2016 05:59    Procedures Procedures (including critical care time)  Medications Ordered in ED Medications  acetaminophen (TYLENOL) tablet 650 mg (650 mg Oral Given 12/09/16 8563)     Initial Impression / Assessment and Plan / ED Course  I have reviewed the triage vital signs and the nursing notes.  Pertinent labs & imaging results that were available during my care of  the patient were reviewed by me and considered in my medical decision making (see chart for details).     Patient's CT shows no signs of intracranial injury. Ice and Tylenol were given to the patient. Sister tells me that the right shoulder pain is more chronic. X-ray without acute abnormalities. History is difficult due to her dementia but most likely the patient fell. I think sink is less likely. It did discuss that this is obviously difficult to 100% no given the patient's lack of recall  given her dementia. Family wants no further treatment or workup at this time and plan to have her follow-up with PCP. Has been a symptomatically in the ED. Discussed return per cautions including worsening head injury precautions given she is on a blood thinner.  Final Clinical Impressions(s) / ED Diagnoses   Final diagnoses:  Fall, initial encounter  Forehead contusion, initial encounter    New Prescriptions New Prescriptions   No medications on file     Sherwood Gambler, MD 12/09/16 [phone]

## 2016-12-09 NOTE — ED Triage Notes (Signed)
Pt brought in by [facility] for c/o a fall; pt doesn't remember how she fell; pt has bump to the right of her forehead

## 2017-02-03 ENCOUNTER — Encounter (HOSPITAL_COMMUNITY): Payer: Self-pay

## 2017-02-03 ENCOUNTER — Emergency Department (HOSPITAL_COMMUNITY): Payer: Medicare Other

## 2017-02-03 ENCOUNTER — Emergency Department (HOSPITAL_COMMUNITY)
Admission: EM | Admit: 2017-02-03 | Discharge: 2017-02-03 | Disposition: A | Discharge status: Home / Self Care | Payer: Medicare Other | Attending: Emergency Medicine | Admitting: Emergency Medicine

## 2017-02-03 DIAGNOSIS — I2510 Atherosclerotic heart disease of native coronary artery without angina pectoris: Secondary | ICD-10-CM | POA: Insufficient documentation

## 2017-02-03 DIAGNOSIS — E039 Hypothyroidism, unspecified: Secondary | ICD-10-CM | POA: Insufficient documentation

## 2017-02-03 DIAGNOSIS — R600 Localized edema: Secondary | ICD-10-CM | POA: Insufficient documentation

## 2017-02-03 DIAGNOSIS — I119 Hypertensive heart disease without heart failure: Secondary | ICD-10-CM | POA: Diagnosis not present

## 2017-02-03 DIAGNOSIS — Z7901 Long term (current) use of anticoagulants: Secondary | ICD-10-CM | POA: Insufficient documentation

## 2017-02-03 DIAGNOSIS — Z79899 Other long term (current) drug therapy: Secondary | ICD-10-CM | POA: Insufficient documentation

## 2017-02-03 DIAGNOSIS — F0390 Unspecified dementia without behavioral disturbance: Secondary | ICD-10-CM | POA: Diagnosis not present

## 2017-02-03 DIAGNOSIS — M7989 Other specified soft tissue disorders: Secondary | ICD-10-CM

## 2017-02-03 LAB — CBC WITH DIFFERENTIAL/PLATELET
BASOS PCT: 0 %
Basophils Absolute: 0 10*3/uL (ref 0.0–0.1)
Eosinophils Absolute: 0.7 10*3/uL (ref 0.0–0.7)
Eosinophils Relative: 6 %
HEMATOCRIT: 37 % (ref 36.0–46.0)
HEMOGLOBIN: 11.8 g/dL — AB (ref 12.0–15.0)
LYMPHS ABS: 2.1 10*3/uL (ref 0.7–4.0)
Lymphocytes Relative: 20 %
MCH: 27.2 pg (ref 26.0–34.0)
MCHC: 31.9 g/dL (ref 30.0–36.0)
MCV: 85.3 fL (ref 78.0–100.0)
MONOS PCT: 14 %
Monocytes Absolute: 1.5 10*3/uL — ABNORMAL HIGH (ref 0.1–1.0)
NEUTROS ABS: 6.2 10*3/uL (ref 1.7–7.7)
NEUTROS PCT: 60 %
Platelets: 334 10*3/uL (ref 150–400)
RBC: 4.34 MIL/uL (ref 3.87–5.11)
RDW: 14.6 % (ref 11.5–15.5)
WBC: 10.5 10*3/uL (ref 4.0–10.5)

## 2017-02-03 LAB — BASIC METABOLIC PANEL
Anion gap: 9 (ref 5–15)
BUN: 26 mg/dL — ABNORMAL HIGH (ref 6–20)
CO2: 28 mmol/L (ref 22–32)
Calcium: 9.3 mg/dL (ref 8.9–10.3)
Chloride: 93 mmol/L — ABNORMAL LOW (ref 101–111)
Creatinine, Ser: 1.24 mg/dL — ABNORMAL HIGH (ref 0.44–1.00)
GFR calc Af Amer: 45 mL/min — ABNORMAL LOW (ref >60)
GFR calc non Af Amer: 39 mL/min — ABNORMAL LOW (ref >60)
Glucose, Bld: 113 mg/dL — ABNORMAL HIGH (ref 65–99)
Potassium: 3.9 mmol/L (ref 3.5–5.1)
Sodium: 130 mmol/L — ABNORMAL LOW (ref 135–145)

## 2017-02-03 LAB — TROPONIN I: Troponin I: <0.03 ng/mL (ref <0.03)

## 2017-02-03 NOTE — ED Notes (Signed)
Pt in restroom 

## 2017-02-03 NOTE — Discharge Instructions (Signed)
Follow-up with your primary care doctor as well as her cardiologist regarding the leg swelling. No acute findings here today.

## 2017-02-03 NOTE — ED Triage Notes (Signed)
Reports of bilateral leg swelling for several weeks. Denies shortness of breath of pain. Was seen at PCP had blood work and results were good per family.

## 2017-02-03 NOTE — ED Notes (Signed)
Patient given discharge instruction, verbalized understand. Patient ambulatory out of the department. Signature pad will not work,

## 2017-02-03 NOTE — ED Provider Notes (Addendum)
[facility] DEPT Provider Note   CSN: [id number] Arrival date & time: 02/03/17  1130     History   Chief Complaint Chief Complaint  Patient presents with  . Leg Swelling    HPI [name] is a 83 y.o. female.  Patient with several day history of increased leg swelling. Patient was given 3 doses of Lasix by primary care provider. Without significant improvement. Patient is also followed by cardiology. Patient was on Lanoxin in the past. But has been off of it for many months. Patient has no shortness of breath denies any chest pain.      Past Medical History:  Diagnosis Date  . Asthmatic bronchitis   . CAD (coronary artery disease)    bare metal stents to 2 separate RCA lesions August, 2011, residual 60% LAD, medical therapy  /  plan was for effient for one month followed by Plavix,, but patient does not tolerate Plavix... therefore patient placed back on effient  . Carotid artery disease ([place])   . Chronic back pain   . Chronic hip pain   . Dementia   . Dementia   . Diabetes mellitus    does not have  . Diverticulitis    remote hx  . Edema 03/29/2010   September, 2011  . Ejection fraction    EF 65 %... normal... catheterization 02/2010.  . Fracture of rib of left side 04/24/2011  . Gait abnormality   . GERD (gastroesophageal reflux disease)   . Hip fracture [facility])    October, 2012  . Hyperglycemia 04/24/2011  . Hypertension   . Hypothyroidism 04/24/2011  . Intolerance of drug    aspirin and plavix  . Mitral regurgitation    mild, echo, June, 2007  . Obesity   . Paroxysmal supraventricular tachycardia (HCC)    infrequent episodes over the years..palpitations.  . Sciatica     Patient Active Problem List   Diagnosis Date Noted  . Dementia with behavioral problem 09/11/2016  . Lumbar stenosis 09/11/2015  . Complex partial seizure ([place]) 09/02/2013  . Subacute confusional state 07/30/2013  . Carotid artery disease ([place])   . Diverticulosis 03/06/2012  .  Abnormal CT of the abdomen 11/25/2011  . Abdominal pain, unspecified site 11/25/2011  . Ejection fraction   . Intolerance of drug   . Hip fracture ([place])   . UTI (urinary tract infection) 04/25/2011  . Leukocytosis 04/24/2011  . Hyponatremia 04/24/2011  . Fracture of left hip ([place]) 04/24/2011  . Asthmatic bronchitis 04/24/2011  . Anemia 04/24/2011  . Hypothyroidism 04/24/2011  . DM2 (diabetes mellitus, type 2) ([place]) 04/24/2011  . Fracture of rib of left side 04/24/2011  . Mitral regurgitation   . Paroxysmal supraventricular tachycardia ([place])   . Hypertension   . Asthmatic bronchitis   . Obesity   . GERD (gastroesophageal reflux disease)   . CAD (coronary artery disease)   . Edema 03/29/2010    Past Surgical History:  Procedure Laterality Date  . ANKLE FRACTURE SURGERY  12/[place]   [place]  . ARTHROPLASTY W/ ARTHROSCOPY MEDIAL / LATERAL COMPARTMENT [facility]     [facility] should [facility]roscopy not [facility]plasty  . CARDIAC CATHETERIZATION  02/22/2010   2 stents, [place]  . ESOPHAGOGASTRODUODENOSCOPY  12/06/11   fields: Negative H. pylori, hiatal hernia/moderate gastritis  . ileocolonscopy  12/06/11   fields:diverticula throughtout the colon/inertnal hemorrhoids/  . KNEE ARTHROSCOPY  11/2001   left knee  . KNEE ARTHROSCOPY  02/19/2011   Procedure: ARTHROSCOPY KNEE;  Surgeon: Sanjuana Kava;  Location: [facility];  Service: Orthopedics;  Laterality: Right;  Medial and Lateral Partial Menisectomy  . LAPAROSCOPIC CHOLECYSTECTOMY  04/29/1996   [facility]  . ORIF HIP FRACTURE  04/25/2011   Procedure: OPEN REDUCTION INTERNAL FIXATION HIP;  Surgeon: Sanjuana Kava;  Location: [facility] OR[facility];  Service: Orthopedics;  Laterality: Left;  . SP ARTHRO THUMB*R*  05/1989   [place]  . TOTAL ABDOMINAL HYSTERECTOMY  1983   [place]  . TUBAL LIGATION  09/1970    OB History    Gravida Para Term Preterm AB Living   6 6 6          SAB TAB Ectopic Multiple Live Births                   Home Medications    Prior to Admission  medications   Medication Sig Start Date End Date Taking? Authorizing Provider  acetaminophen (TYLENOL) 500 MG tablet Take 1,000 mg by mouth every 6 (six) hours as needed for mild pain.   Yes [provider]  albuterol (PROVENTIL) (2.5 MG/3ML) 0.083% nebulizer solution Take 2.5 mg by nebulization daily as needed. 12/12/15  Yes [provider]  ALPRAZolam Duanne Moron) 0.5 MG tablet Take 0.25-0.5 mg by mouth daily as needed. TAKE HALF TO ONE  A TABLET IF NEEDED FOR ANXIETY   Yes [provider]  amLODipine (NORVASC) 10 MG tablet Take 10 mg by mouth every morning.    Yes [provider]  BREO ELLIPTA 100-25 MCG/INH AEPB Inhale 1 puff into the lungs daily. 12/04/15  Yes [provider]  cetirizine (ZYRTEC) 10 MG tablet Take 5 mg by mouth daily.    Yes [provider]  citalopram (CELEXA) 10 MG tablet Take 2 tablets (20 mg total) by mouth daily. Patient taking differently: Take 10 mg by mouth daily.  09/11/16  Yes Marcial Pacas, MD  esomeprazole (NEXIUM) 40 MG capsule TAKE 1 CAPSULE (40 MG TOTAL)  BY MOUTH DAILY BEFORE BREAKFAST. 01/24/14  Yes Fields, Sandi L, MD  fluticasone (FLONASE) 50 MCG/ACT nasal spray Place 1 spray into both nostrils daily as needed for allergies.  06/25/13  Yes [provider]  fluticasone (FLOVENT HFA) 44 MCG/ACT inhaler Inhale 1 puff into the lungs 2 (two) times daily as needed (Shortness of Breath).    Yes [provider]  furosemide (LASIX) 20 MG tablet Take 20 mg by mouth daily.  01/30/17  Yes [provider]  levETIRAcetam (KEPPRA) 500 MG tablet Take 1 tablet (500 mg total) by mouth 2 (two) times daily. 09/11/16  Yes Marcial Pacas, MD  Levothyroxine Sodium 100 MCG CAPS Take 100 mcg by mouth daily before breakfast.    Yes [provider]  losartan (COZAAR) 25 MG tablet Take 25 mg by mouth daily.  08/26/13  Yes [provider]  losartan-hydrochlorothiazide (HYZAAR) 50-12.5 MG tablet TAKE 1 TABLET  ONCE A DAY 01/20/17  Yes [provider]  metoprolol succinate (TOPROL-XL) 100 MG 24 hr tablet Take 100 mg by mouth 2 (two) times daily. Take with or immediately following a meal.    Yes [provider]  nitroGLYCERIN (NITROSTAT) 0.4 MG SL tablet Place 1 tablet (0.4 mg total) under the tongue every 5 (five) minutes as needed for chest pain. 12/26/15  Yes Herminio Commons, MD  polyethylene glycol (MIRALAX / GLYCOLAX) packet Take 17 g by mouth daily as needed for mild constipation.    Yes [provider]  Polyvinyl Alcohol-Povidone (MURINE TEARS FOR DRY EYES) 5-6 MG/ML  SOLN Place 1 drop into both eyes every morning. As needed   Yes [provider]  prasugrel (EFFIENT) 5 MG TABS tablet Take 1 tablet (5 mg total) by mouth daily. 03/09/15  Yes Carlena Bjornstad, MD  traMADol (ULTRAM) 50 MG tablet Take 1 tablet (50 mg total) by mouth every 12 (twelve) hours as needed. for pain 04/28/16  Yes Sanjuana Kava, MD  doxycycline (VIBRAMYCIN) 100 MG capsule Take 1 capsule (100 mg total) by mouth 2 (two) times daily. Patient not taking: Reported on 02/03/2017 09/28/16   [name], MD  mometasone (NASONEX) 50 MCG/ACT nasal spray Place 2 sprays into the nose daily as needed (Congestion).     [provider]    Family History Family History  Problem Relation Age of Onset  . Diabetes Mother   . Arthritis Mother   . Heart attack Father   . Stroke Father   . Emphysema Father   . Cataracts Father   . Cancer Sister   . Heart disease Sister   . Arthritis Sister   . Arthritis Brother   . Allergies Brother   . Colon cancer Neg Hx     Social History Social History  Substance Use Topics  . Smoking status: Never Smoker  . Smokeless tobacco: Never Used  . Alcohol use No     Comment: rarely     Allergies   Celecoxib; Codeine; Hydrocodone; Tartrazine; and Aspirin   Review of Systems Review of Systems  Constitutional: Negative for fever.  HENT: Negative for  congestion.   Eyes: Negative for redness.  Respiratory: Negative for shortness of breath.   Cardiovascular: Positive for leg swelling. Negative for chest pain.  Gastrointestinal: Negative for abdominal pain.  Genitourinary: Negative for dysuria.  Musculoskeletal: Negative for back pain.  Skin: Negative for rash.  Neurological: Negative for headaches.  Hematological: Does not bruise/bleed easily.  Psychiatric/Behavioral: Positive for confusion.     Physical Exam Updated Vital Signs BP (!) 116/59 (BP Location: Right Arm)   Pulse 69   Temp (!) 97.3 F (36.3 C) (Oral)   Resp 18   Ht 1.549 m (5\' 1" )   Wt 70.8 kg (156 lb)   SpO2 100%   BMI 29.48 kg/m   Physical Exam  Constitutional: She appears well-developed and well-nourished. No distress.  HENT:  Head: Normocephalic and atraumatic.  Mouth/Throat: Oropharynx is clear and moist.  Eyes: Pupils are equal, round, and reactive to light. Conjunctivae and EOM are normal.  Neck: Normal range of motion. Neck supple.  Cardiovascular: Normal rate, regular rhythm and normal heart sounds.   Pulmonary/Chest: Effort normal and breath sounds normal. No respiratory distress.  Abdominal: Soft. Bowel sounds are normal. There is no tenderness.  Musculoskeletal: Normal range of motion. She exhibits edema.  Significant bilateral leg swelling. Up to the knees.  Neurological: She is alert. No cranial nerve deficit or sensory deficit. She exhibits normal muscle tone. Coordination normal.  Skin: Skin is warm. No erythema.  Nursing note and vitals reviewed.    ED Treatments / Results  Labs (all labs ordered are listed, but only abnormal results are displayed) Labs Reviewed  CBC WITH DIFFERENTIAL/PLATELET - Abnormal; Notable for the following:       Result Value   Hemoglobin 11.8 (*)    Monocytes Absolute 1.5 (*)    All other components within normal limits  BASIC METABOLIC PANEL - Abnormal; Notable for the following:    Sodium 130 (*)     Chloride 93 (*)  Glucose, Bld 113 (*)    BUN 26 (*)    Creatinine, Ser 1.24 (*)    GFR calc non Af Amer 39 (*)    GFR calc Af Amer 45 (*)    All other components within normal limits  TROPONIN I    EKG  EKG Interpretation None       Radiology Dg Chest 2 View  Result Date: 02/03/2017 CLINICAL DATA:  Bilateral leg swelling for several weeks. EXAM: CHEST  2 VIEW COMPARISON:  09/28/2016 FINDINGS: The cardiac silhouette is upper limits of normal in size. Aortic atherosclerosis is noted. The thoracic aorta is tortuous. There is scarring in the left lung base. No acute airspace consolidation, edema, pleural effusion, or pneumothorax is identified. Thoracolumbar disc degeneration and thighs mild levoscoliosis are noted. IMPRESSION: No active cardiopulmonary disease. Electronically Signed   By: Logan Bores M.D.   On: 02/03/2017 12:37    Procedures Procedures (including critical care time)  Medications Ordered in ED Medications - No data to display   Initial Impression / Assessment and Plan / ED Course  I have reviewed the triage vital signs and the nursing notes.  Pertinent labs & imaging results that were available during my care of the patient were reviewed by me and considered in my medical decision making (see chart for details).    No evidence of congestive heart failure. Patient does have significant leg swelling. Also no evidence of any silent MI. Patient has been on Lasix for the past 3 days. Renal functions baseline sodium is a bit lower than usual at 130. Since there is no significant heart failure will recommend follow-up with cardiology and primary care doctor and they can decide how they want to approach the leg swelling further. I do not recommend daily doses of Lasix at this time.   Final Clinical Impressions(s) / ED Diagnoses   Final diagnoses:  Leg swelling    New Prescriptions New Prescriptions   No medications on file     Fredia Sorrow, MD 02/03/17  1730    Fredia Sorrow, MD 02/03/17 1759

## 2017-02-05 ENCOUNTER — Ambulatory Visit (INDEPENDENT_AMBULATORY_CARE_PROVIDER_SITE_OTHER): Payer: Medicare Other | Admitting: Cardiovascular Disease

## 2017-02-05 ENCOUNTER — Encounter: Payer: Self-pay | Admitting: Cardiovascular Disease

## 2017-02-05 VITALS — BP 124/62 | HR 58 | Ht 62.0 in | Wt 153.0 lb

## 2017-02-05 DIAGNOSIS — Z9289 Personal history of other medical treatment: Secondary | ICD-10-CM | POA: Diagnosis not present

## 2017-02-05 DIAGNOSIS — R600 Localized edema: Secondary | ICD-10-CM

## 2017-02-05 DIAGNOSIS — I10 Essential (primary) hypertension: Secondary | ICD-10-CM

## 2017-02-05 DIAGNOSIS — I2510 Atherosclerotic heart disease of native coronary artery without angina pectoris: Secondary | ICD-10-CM

## 2017-02-05 DIAGNOSIS — Z955 Presence of coronary angioplasty implant and graft: Secondary | ICD-10-CM

## 2017-02-05 DIAGNOSIS — I471 Supraventricular tachycardia, unspecified: Secondary | ICD-10-CM

## 2017-02-05 NOTE — Progress Notes (Signed)
SUBJECTIVE: The patient presents for follow-up of coronary artery disease. She reportedly underwent 2 separate interventions to the RCA in August 2011 and has some residual moderate disease in the LAD. She is allergic to aspirin and intolerant of Plavix and therefore remains on Effient.  She was evaluated in the ED for leg swelling 2 days ago. I reviewed all relevant documentation, labs, and studies.  CBC was unremarkable. Troponin was normal. BUN 26, creatinine 1.24.  Chest x-ray showed no active cardiopulmonary disease.  Echocardiogram on 08/05/13 demonstrated normal left ventricular systolic function, LVEF 86-57%, with grade 1 diastolic dysfunction, and mild to moderate tricuspid and moderate pulmonary regurgitation. There was severe biatrial dilatation.  She denies chest pain and shortness of breath. Her PCP put her on Lasix for 3 days but when her sodium began to drop it was stopped. She developed bilateral leg swelling to the knees which began approximately a week ago. She denies orthopnea and paroxysmal nocturnal dyspnea.   Review of Systems: As per "subjective", otherwise negative.  Allergies  Allergen Reactions  . Celecoxib Other (See Comments)    TACHYCARDIA  . Codeine Other (See Comments)    TACHYCARIDIA  . Hydrocodone Other (See Comments)    TACHYCARDIA  . Tartrazine Other (See Comments)    Causes Tachycardia  . Aspirin     ONLY TARTRAZINE!!! Causes Tachycardia    Current Outpatient Prescriptions  Medication Sig Dispense Refill  . acetaminophen (TYLENOL) 500 MG tablet Take 1,000 mg by mouth every 6 (six) hours as needed for mild pain.    Marland Kitchen albuterol (PROVENTIL) (2.5 MG/3ML) 0.083% nebulizer solution Take 2.5 mg by nebulization daily as needed.    . ALPRAZolam (XANAX) 0.5 MG tablet Take 0.25-0.5 mg by mouth daily as needed. TAKE HALF TO ONE  A TABLET IF NEEDED FOR ANXIETY    . amLODipine (NORVASC) 10 MG tablet Take 10 mg by mouth every morning.     Marland Kitchen BREO  ELLIPTA 100-25 MCG/INH AEPB Inhale 1 puff into the lungs daily.    . cetirizine (ZYRTEC) 10 MG tablet Take 5 mg by mouth daily.     . citalopram (CELEXA) 10 MG tablet Take 2 tablets (20 mg total) by mouth daily. (Patient taking differently: Take 10 mg by mouth daily. ) 60 tablet 11  . doxycycline (VIBRAMYCIN) 100 MG capsule Take 1 capsule (100 mg total) by mouth 2 (two) times daily. 14 capsule 0  . esomeprazole (NEXIUM) 40 MG capsule TAKE 1 CAPSULE (40 MG TOTAL)  BY MOUTH DAILY BEFORE BREAKFAST. 30 capsule 5  . fluticasone (FLONASE) 50 MCG/ACT nasal spray Place 1 spray into both nostrils daily as needed for allergies.     . fluticasone (FLOVENT HFA) 44 MCG/ACT inhaler Inhale 1 puff into the lungs 2 (two) times daily as needed (Shortness of Breath).     . furosemide (LASIX) 20 MG tablet Take 20 mg by mouth daily.     Marland Kitchen levETIRAcetam (KEPPRA) 500 MG tablet Take 1 tablet (500 mg total) by mouth 2 (two) times daily. 180 tablet 4  . Levothyroxine Sodium 100 MCG CAPS Take 100 mcg by mouth daily before breakfast.     . losartan (COZAAR) 25 MG tablet Take 25 mg by mouth daily.     Marland Kitchen losartan-hydrochlorothiazide (HYZAAR) 50-12.5 MG tablet TAKE 1 TABLET ONCE A DAY    . metoprolol succinate (TOPROL-XL) 100 MG 24 hr tablet Take 100 mg by mouth 2 (two) times daily. Take with or immediately following a  meal.     . mometasone (NASONEX) 50 MCG/ACT nasal spray Place 2 sprays into the nose daily as needed (Congestion).     . nitroGLYCERIN (NITROSTAT) 0.4 MG SL tablet Place 1 tablet (0.4 mg total) under the tongue every 5 (five) minutes as needed for chest pain. 25 tablet 3  . polyethylene glycol (MIRALAX / GLYCOLAX) packet Take 17 g by mouth daily as needed for mild constipation.     . Polyvinyl Alcohol-Povidone (MURINE TEARS FOR DRY EYES) 5-6 MG/ML SOLN Place 1 drop into both eyes every morning. As needed    . prasugrel (EFFIENT) 5 MG TABS tablet Take 1 tablet (5 mg total) by mouth daily. 30 tablet 6  . traMADol  (ULTRAM) 50 MG tablet Take 1 tablet (50 mg total) by mouth every 12 (twelve) hours as needed. for pain 60 tablet 0   No current facility-administered medications for this visit.     Past Medical History:  Diagnosis Date  . Asthmatic bronchitis   . CAD (coronary artery disease)    bare metal stents to 2 separate RCA lesions August, 2011, residual 60% LAD, medical therapy  /  plan was for effient for one month followed by Plavix,, but patient does not tolerate Plavix... therefore patient placed back on effient  . Carotid artery disease ([place])   . Chronic back pain   . Chronic hip pain   . Dementia   . Dementia   . Diabetes mellitus    does not have  . Diverticulitis    remote hx  . Edema 03/29/2010   September, 2011  . Ejection fraction    EF 65 %... normal... catheterization 02/2010.  . Fracture of rib of left side 04/24/2011  . Gait abnormality   . GERD (gastroesophageal reflux disease)   . Hip fracture [facility])    October, 2012  . Hyperglycemia 04/24/2011  . Hypertension   . Hypothyroidism 04/24/2011  . Intolerance of drug    aspirin and plavix  . Mitral regurgitation    mild, echo, June, 2007  . Obesity   . Paroxysmal supraventricular tachycardia (HCC)    infrequent episodes over the years..palpitations.  . Sciatica     Past Surgical History:  Procedure Laterality Date  . ANKLE FRACTURE SURGERY  12/1993   [place]  . ARTHROPLASTY W/ ARTHROSCOPY MEDIAL / LATERAL COMPARTMENT [facility]     [facility] should [facility] not [facility]plasty  . CARDIAC CATHETERIZATION  02/22/2010   2 stents, [place]  . ESOPHAGOGASTRODUODENOSCOPY  12/06/11   fields: Negative H. pylori, hiatal hernia/moderate gastritis  . ileocolonscopy  12/06/11   fields:diverticula throughtout the colon/inertnal hemorrhoids/  . KNEE ARTHROSCOPY  11/2001   left knee  . KNEE ARTHROSCOPY  02/19/2011   Procedure: ARTHROSCOPY KNEE;  Surgeon: Sanjuana Kava;  Location: [facility];  Service: Orthopedics;  Laterality: Right;  Medial and Lateral  Partial Menisectomy  . LAPAROSCOPIC CHOLECYSTECTOMY  04/29/1996   [facility]  . ORIF HIP FRACTURE  04/25/2011   Procedure: OPEN REDUCTION INTERNAL FIXATION HIP;  Surgeon: Sanjuana Kava;  Location: [facility];  Service: Orthopedics;  Laterality: Left;  . SP ARTHRO THUMB*R*  05/1989   [place]  . TOTAL ABDOMINAL HYSTERECTOMY  1983   [place]  . TUBAL LIGATION  09/1970    Social History   Social History  . Marital status: Widowed    Spouse name: N/A  . Number of children: 6  . Years of education: college   Occupational History  . [employer]  School Teacher   Social History Main Topics  . Smoking status: Never Smoker  . Smokeless tobacco: Never Used  . Alcohol use No     Comment: rarely  . Drug use: No  . Sexual activity: No   Other Topics Concern  . 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   Right handed.   Caffeine. None   Patient lives at home with her sister Bartolo Darter)            Vitals:   02/05/17 0837  BP: 124/62  Pulse: (!) 58  SpO2: 97%  Weight: 153 lb (69.4 kg)  Height: 5\' 2"  (1.575 m)    Wt Readings from Last 3 Encounters:  02/05/17 153 lb (69.4 kg)  02/03/17 156 lb (70.8 kg)  12/09/16 159 lb (72.1 kg)     PHYSICAL EXAM General: NAD HEENT: Normal. Neck: No JVD, no thyromegaly. Lungs: Clear to auscultation bilaterally with normal respiratory effort. CV: Nondisplaced PMI.  Regular rate and rhythm, normal S1/S2, no G8/J8, soft systolic murmur along left sternal border. 1-2+ pitting bilateral pretibial and periankle edema.   Abdomen: Soft, nontender, no distention.  Neurologic: Alert and oriented.  Psych: Normal affect. Musculoskeletal: No gross deformities.    ECG: Most recent ECG reviewed.   Labs: Lab Results  Component Value Date/Time   K 3.9 02/03/2017 12:05 PM   BUN 26 (H) 02/03/2017 12:05 PM   CREATININE 1.24 (H) 02/03/2017 12:05 PM   ALT 14 01/13/2016 07:58 AM   TSH 2.445  04/24/2011 07:58 AM   HGB 11.8 (L) 02/03/2017 12:05 PM     Lipids: Lab Results  Component Value Date/Time   [facility]  02/23/2010 05:24 AM    62        Total Cholesterol/HDL:CHD Risk Coronary Heart Disease Risk Table                     Men   Women  1/2 Average Risk   3.4   3.3  Average Risk       5.0   4.4  2 X Average Risk   9.6   7.1  3 X Average Risk  23.4   11.0        Use the calculated Patient Ratio above and the CHD Risk Table to determine the patient's CHD Risk.        ATP III CLASSIFICATION (LDL):  <100     mg/dL   Optimal  100-129  mg/dL   Near or Above                    Optimal  130-159  mg/dL   Borderline  160-189  mg/dL   High  >190     mg/dL   Very High   CHOL  02/23/2010 05:24 AM    132        ATP III CLASSIFICATION:  <200     mg/dL   Desirable  200-239  mg/dL   Borderline High  >=240    mg/dL   High          TRIG 184 (H) 02/23/2010 05:24 AM   HDL 33 (L) 02/23/2010 05:24 AM       ASSESSMENT AND PLAN:  1. CAD: Symptomatically stable. Continue Effient and metoprolol.   2. Essential HTN: Controlled. No changes.  3. PSVT: Symptomatically stable on metoprolol. No changes.  4. Bilateral leg edema:  I will obtain an echocardiogram to evaluate cardiac structure and function and to assess for interval changes and valvular heart disease. She probably also has a component of venous insufficiency in which case leg elevation and compression stockings are warranted.     Disposition: Follow up 1 month   Kate Sable, M.D., F.A.C.C.

## 2017-02-05 NOTE — Patient Instructions (Signed)
Medication Instructions:  Your physician recommends that you continue on your current medications as directed. Please refer to the Current Medication list given to you today.   Labwork: NONE  Testing/Procedures: Your physician has requested that you have an echocardiogram. Echocardiography is a painless test that uses sound waves to create images of your heart. It provides your doctor with information about the size and shape of your heart and how well your heart's chambers and valves are working. This procedure takes approximately one hour. There are no restrictions for this procedure.    Follow-Up: Your physician recommends that you schedule a follow-up appointment in: 1 MONTH    Any Other Special Instructions Will Be Listed Below (If Applicable).     If you need a refill on your cardiac medications before your next appointment, please call your pharmacy.   

## 2017-02-10 ENCOUNTER — Ambulatory Visit (HOSPITAL_COMMUNITY)
Admission: RE | Admit: 2017-02-10 | Discharge: 2017-02-10 | Disposition: A | Discharge status: Home / Self Care | Payer: Medicare Other | Source: Ambulatory Visit | Attending: Cardiovascular Disease | Admitting: Cardiovascular Disease

## 2017-02-10 DIAGNOSIS — I340 Nonrheumatic mitral (valve) insufficiency: Secondary | ICD-10-CM | POA: Diagnosis not present

## 2017-02-10 DIAGNOSIS — R600 Localized edema: Secondary | ICD-10-CM | POA: Insufficient documentation

## 2017-02-10 LAB — ECHOCARDIOGRAM COMPLETE
Area-P 1/2: 2.22 cm2
CHL CUP MV M VEL: 80.4
E decel time: 380 msec
EERAT: 21.5
FS: 41 % (ref 28–44)
IVS/LV PW RATIO, ED: 1.48
LA ID, A-P, ES: 36 mm
LA diam end sys: 36 mm
LA vol index: 41.9 mL/m2
LADIAMINDEX: 2.11 cm/m2
LAVOL: 71.7 mL
LAVOLA4C: 62.9 mL
LV E/e' medial: 21.5
LV E/e'average: 21.5
LV TDI E'LATERAL: 6.42
LV TDI E'MEDIAL: 4.03
LVELAT: 6.42 cm/s
LVOT area: 2.84 cm2
LVOT diameter: 19 mm
MV Dec: 380
MV pk A vel: 112 m/s
MVANNULUSVTI: 55 cm
MVPG: 8 mmHg
MVPKEVEL: 138 m/s
Mean grad: 3 mmHg
P 1/2 time: 99 ms
PW: 8.39 mm — AB (ref 0.6–1.1)
RV LATERAL S' VELOCITY: 9.57 cm/s
RV TAPSE: 21.2 mm
Reg peak vel: 314 cm/s
TRMAXVEL: 314 cm/s

## 2017-02-10 NOTE — Progress Notes (Signed)
*  PRELIMINARY RESULTS* Echocardiogram 2D Echocardiogram has been performed.  Katie Ford 02/10/2017, 9:22 AM

## 2017-02-12 ENCOUNTER — Telehealth: Payer: Self-pay | Admitting: Cardiovascular Disease

## 2017-02-12 MED ORDER — FUROSEMIDE 20 MG PO TABS: 20.0000 mg | ORAL_TABLET | ORAL | 1 refills | Status: DC | PRN

## 2017-02-12 NOTE — Telephone Encounter (Signed)
Son Haywood Lasso) called wanting to know test results and wants to speak with someone about her legs still swelling.   Stated that she is complaining about her legs hurting her something terrible

## 2017-02-12 NOTE — Telephone Encounter (Signed)
-----   Message from Arnoldo Lenis, MD sent at 02/12/2017  1:36 PM EDT ----- Echo shows heart pumping function is normal. Her heart muscle is a little bit thicker/stiffer than normal, this often happens with age and can cause some leg swelling at times. Dr Raliegh Ip to discuss further details at f/u, overall I would say echo looks good  Zandra Abts MD

## 2017-02-12 NOTE — Telephone Encounter (Signed)
Can have Rx for lasix 20mg  daily prn leg swelling. She also needs an order for compression stockings if she does not have a pair  Zandra Abts MD

## 2017-02-12 NOTE — Telephone Encounter (Signed)
Pt son/sister (DPR) made aware - wanted to know if pt should be taking lasix prn - if so requesting refills since she only had an rx for 3 pills

## 2017-02-12 NOTE — Telephone Encounter (Signed)
Pt aware - lasix sent to pharmacy - compression stocking rx faxed to pharmacy

## 2017-02-19 ENCOUNTER — Telehealth: Payer: Self-pay | Admitting: Cardiovascular Disease

## 2017-02-19 DIAGNOSIS — R600 Localized edema: Secondary | ICD-10-CM

## 2017-02-19 NOTE — Telephone Encounter (Signed)
Please forward to Dr Raliegh Ip, one of his patients.   Zandra Abts MD

## 2017-02-19 NOTE — Telephone Encounter (Signed)
Will forward to Dr. Koneswaran  

## 2017-02-19 NOTE — Telephone Encounter (Signed)
Patient's son states that "fluid pill" is not working / tg

## 2017-02-19 NOTE — Telephone Encounter (Signed)
Son reports that lasix is not working. Pt denies SOB and CP at this time. Pt wt today is 153 lb. Family has not been weighing pt daily but will start. Son states that his mother does not currently have compression stockings at this time but he will stop by the office and pick up Rx and have it filled. Please advise.

## 2017-02-20 NOTE — Addendum Note (Signed)
Addended by: Barbarann Ehlers A on: 02/20/2017 12:32 PM   Modules accepted: Orders

## 2017-02-20 NOTE — Telephone Encounter (Signed)
I spoke with son, he will give Lasix 40 mg BID for 3 days and repeat BMET in 2 days, lab slip at front desk

## 2017-02-20 NOTE — Telephone Encounter (Signed)
Take Lasix 40 mg bid x 3 days. Check BMET in 2 days.

## 2017-02-24 ENCOUNTER — Ambulatory Visit: Payer: Self-pay | Admitting: Cardiovascular Disease

## 2017-02-25 ENCOUNTER — Other Ambulatory Visit (HOSPITAL_COMMUNITY)
Admission: RE | Admit: 2017-02-25 | Discharge: 2017-02-25 | Disposition: A | Discharge status: Home / Self Care | Payer: Medicare Other | Source: Ambulatory Visit | Attending: Cardiovascular Disease | Admitting: Cardiovascular Disease

## 2017-02-25 DIAGNOSIS — R600 Localized edema: Secondary | ICD-10-CM | POA: Diagnosis present

## 2017-02-25 LAB — BASIC METABOLIC PANEL
Anion gap: 9 (ref 5–15)
BUN: 38 mg/dL — ABNORMAL HIGH (ref 6–20)
CHLORIDE: 93 mmol/L — AB (ref 101–111)
CO2: 32 mmol/L (ref 22–32)
Calcium: 9 mg/dL (ref 8.9–10.3)
Creatinine, Ser: 1.39 mg/dL — ABNORMAL HIGH (ref 0.44–1.00)
GFR calc Af Amer: 39 mL/min — ABNORMAL LOW (ref >60)
GFR calc non Af Amer: 34 mL/min — ABNORMAL LOW (ref >60)
GLUCOSE: 162 mg/dL — AB (ref 65–99)
POTASSIUM: 3.1 mmol/L — AB (ref 3.5–5.1)
Sodium: 134 mmol/L — ABNORMAL LOW (ref 135–145)

## 2017-02-26 ENCOUNTER — Telehealth: Payer: Self-pay

## 2017-02-26 MED ORDER — POTASSIUM CHLORIDE CRYS ER 20 MEQ PO TBCR: EXTENDED_RELEASE_TABLET | ORAL | 0 refills | Status: DC

## 2017-02-26 NOTE — Telephone Encounter (Signed)
-----   Message from Herminio Commons, MD sent at 02/25/2017 11:54 AM EDT ----- Reduce Lasix to 40 mg daily and provide 40 meq KCL daily x 3 days.

## 2017-02-26 NOTE — Telephone Encounter (Signed)
Sent in potassium 40 meq daily for next 3 days. Mailed a copy of labs.

## 2017-03-03 ENCOUNTER — Telehealth: Payer: Self-pay | Admitting: Cardiovascular Disease

## 2017-03-03 MED ORDER — FUROSEMIDE 40 MG PO TABS: 40.0000 mg | ORAL_TABLET | Freq: Every day | ORAL | 0 refills | Status: DC

## 2017-03-03 NOTE — Telephone Encounter (Signed)
Please call patient's son in regards to dosage of medications. / tg

## 2017-03-03 NOTE — Telephone Encounter (Signed)
Son informed that furosemide is 40 mg daily and K+ 40 meq daily for 3 days only from 02/26/17. Son verbalized understanding of plan.

## 2017-03-10 NOTE — Progress Notes (Signed)
[facility]  PATIENT: Katie Ford DOB: 18-Jun-1932   REASON FOR VISIT: Follow-up for seizure disorder and memory loss  HISTORY FROM: patient and daughters    HISTORY OF PRESENT ILLNESS:Katie Ford is a 83  years old right-handed Caucasian female, accompanied by her sister, four daughters, referred by emergency room, and her primary care physician Dr. Deirdre Ford for evaluation of recent onset confusion spells  She had a past medical history of anxiety, mitral valve regurgitation, coronary artery disease,cardiologist is Dr. Ron Ford, she is on effient, digoxin treatment.  She is a retired Careers information officer, remained to be physically active, including swimming regularly, she did has mild gait difficulty after her left hip fracture, and fixation surgery in  2012, she lives with her sister, prior her most recent hospital presentation in July 11 2013,she is very active, balance her checkbook,drive long distance without difficulty.  In July 11 2013, she was noticed to have difficulty sorted out her medications, got very frustrated, she complains of not feeling well, went to bed early that night, woke up around 1 AM in the morning, confused, she went to her sister's bedroom, on asking her where she is, later she went to bed, was noted by her family December 21st increased confusion, did not know where she is, she was taken to hospital, CAT scan of the brain showed previous old stroke in the left basal ganglion, chronic sinusitis, small vessel disease, no acute stroke,  Ultrasound of carotid arteries showed less than 50% stenosis of bilateral internal carotid artery  Bilateral lower extremity venous Doppler showed no DVT in bilateral lower extremity,  Bilateral hip X-ray demonstrated previous left hip surgery, no evidence of acute process,  She was treated with antibiotics Keflex for sinusitis, over the past 2 weeks, she has improved, but not back to her baseline, she  continued to have intermittent episodes of confusion, eye blinking spells, lasting for a few minutes, followed by extreme fatigue, tends to sleep afterwards  I have reviewed MRI together with patient and her family, MRI of the brain has demonstrated positive diffusion imaging shows a 5 mm focus of acute infarction in the midline body of the corpus callosum. No other acute infarction.  There chronic small-vessel changes affecting the pons.  There are moderate changes of chronic small vessel disease affecting the deep and subcortical cerebral hemispheric white matter. There was diffuse atrophy  She denies a previous history of seizure  UPDATE Sep 02, 2013: MRA of the brain was normal.    She fell, was taken to the emergency room, had MRI of pelvic showed no acute findings related to recent fall. Moderate right gluteus minimus tendinosis with adjacent bursal fluid. No tendon tear demonstrated. Left pelvic muscular atrophy related to remote left proximal femoral ORIF.  Lower lumbar spondylosis with chronic resulting foraminal  Stenosis.  MRI cervical in December 2014 Spondylosis at C5-6 contributes to mild central and moderate biforaminal stenosis.  Sometimes, urinary urgency since 2012, no change.   She was started on Keppra, 500mg  bid, she feels more alert, is also confirmed by her sister,  no more confusion episodes, no more spells  UPDATE Sep 02 2014: She is now taking Keppra 500mg  bid, no more seizure like event, no side effect, still swimming.  She is driving some, word finding difficulty, short memory trouble, she lives with her sister,   UPDATE Sep 11 2015: She still swim, her low lumbar spine compression fracture  I have personally reviewed MRI lumbar in Dec  2016: Severe degenerative lumbar spondylosis with multilevel disc disease and facet disease, severe multifactorial spinal stenosis and bilateral recess stenosis at L3-4, L4-5, broad-based left foraminal extraforaminal disc  protrusion at L4-5 with significant left foraminal stenosis, Stable lower thoracic degenerative disc disease. Persistent spinal stenosis and mild malacia involving the lower thoracic cord at T11-12.  She has significant gait difficulty, low back pain, no recurrent seizure, tolerating Keppra 500 mg twice a day, continue has mild memory trouble  UPDATE Sep 11 2016: She is accompanied by her daughter and grandson at today's clinical visit, she had no recurrent seizure, but she had slight worsening memory loss, also becoming easily agitated, throw hot pan at her family members, chronic low back pain, hip pain, mild gait abnormality. UPDATE 08/21/2018CM Katie Ford, 83 year old female returns for follow-up with history of seizure disorder and memory loss. Her memory has continued to decline. She has not had further seizure activity. She was placed on Seroquel and Remeron at her last visit with Dr. Krista Ford. Those medications were not started. She had a daughter die recently at the age of 10 unexpectedly. She continues to have low back pain and gait abnormality.No recent falls. Ambulates with a quad cane. Family members assist patient in her home.   REVIEW OF SYSTEMS: Full 14 system review of systems performed and notable only for those listed, all others are neg:  Constitutional: neg  Cardiovascular: neg Ear/Nose/Throat: neg  Skin: neg Eyes: neg Respiratory: neg Gastroitestinal: neg  Hematology/Lymphatic: neg  Endocrine: neg Musculoskeletal:chronic back pain, gait disorder Allergy/Immunology: neg Neurological: memory loss, seizure disorder Psychiatric: neg Sleep : neg   ALLERGIES: Allergies  Allergen Reactions  . Celecoxib Other (See Comments)    TACHYCARDIA  . Codeine Other (See Comments)    TACHYCARIDIA  . Hydrocodone Other (See Comments)    TACHYCARDIA  . Tartrazine Other (See Comments)    Causes Tachycardia  . Aspirin     ONLY TARTRAZINE!!! Causes Tachycardia    HOME  MEDICATIONS: Outpatient Medications Prior to Visit  Medication Sig Dispense Refill  . acetaminophen (TYLENOL) 500 MG tablet Take 1,000 mg by mouth every 6 (six) hours as needed for mild pain.    Marland Kitchen albuterol (PROVENTIL) (2.5 MG/3ML) 0.083% nebulizer solution Take 2.5 mg by nebulization daily as needed.    . ALPRAZolam (XANAX) 0.5 MG tablet Take 0.25-0.5 mg by mouth daily as needed. TAKE HALF TO ONE  A TABLET IF NEEDED FOR ANXIETY    . amLODipine (NORVASC) 10 MG tablet Take 10 mg by mouth every morning.     Marland Kitchen BREO ELLIPTA 100-25 MCG/INH AEPB Inhale 1 puff into the lungs daily.    . cetirizine (ZYRTEC) 10 MG tablet Take 5 mg by mouth daily.     . citalopram (CELEXA) 10 MG tablet Take 2 tablets (20 mg total) by mouth daily. (Patient taking differently: Take 10 mg by mouth daily. ) 60 tablet 11  . esomeprazole (NEXIUM) 40 MG capsule TAKE 1 CAPSULE (40 MG TOTAL)  BY MOUTH DAILY BEFORE BREAKFAST. 30 capsule 5  . fluticasone (FLONASE) 50 MCG/ACT nasal spray Place 1 spray into both nostrils daily as needed for allergies.     . fluticasone (FLOVENT HFA) 44 MCG/ACT inhaler Inhale 1 puff into the lungs 2 (two) times daily as needed (Shortness of Breath).     . furosemide (LASIX) 40 MG tablet Take 1 tablet (40 mg total) by mouth daily. 90 tablet 0  . levETIRAcetam (KEPPRA) 500 MG tablet Take 1 tablet (500 mg  total) by mouth 2 (two) times daily. 180 tablet 4  . Levothyroxine Sodium 100 MCG CAPS Take 100 mcg by mouth daily before breakfast.     . losartan (COZAAR) 25 MG tablet Take 25 mg by mouth daily.     Marland Kitchen losartan-hydrochlorothiazide (HYZAAR) 50-12.5 MG tablet TAKE 1 TABLET ONCE A DAY    . metoprolol succinate (TOPROL-XL) 100 MG 24 hr tablet Take 100 mg by mouth 2 (two) times daily. Take with or immediately following a meal.     . mometasone (NASONEX) 50 MCG/ACT nasal spray Place 2 sprays into the nose daily as needed (Congestion).     . nitroGLYCERIN (NITROSTAT) 0.4 MG SL tablet Place 1 tablet (0.4 mg  total) under the tongue every 5 (five) minutes as needed for chest pain. 25 tablet 3  . polyethylene glycol (MIRALAX / GLYCOLAX) packet Take 17 g by mouth daily as needed for mild constipation.     . Polyvinyl Alcohol-Povidone (MURINE TEARS FOR DRY EYES) 5-6 MG/ML SOLN Place 1 drop into both eyes every morning. As needed    . potassium chloride SA (K-DUR,KLOR-CON) 20 MEQ tablet Take 40 meq daily for next three days. 10 tablet 0  . prasugrel (EFFIENT) 5 MG TABS tablet Take 1 tablet (5 mg total) by mouth daily. 30 tablet 6  . traMADol (ULTRAM) 50 MG tablet Take 1 tablet (50 mg total) by mouth every 12 (twelve) hours as needed. for pain 60 tablet 0  . doxycycline (VIBRAMYCIN) 100 MG capsule Take 1 capsule (100 mg total) by mouth 2 (two) times daily. (Patient not taking: Reported on 03/11/2017) 14 capsule 0   No facility-administered medications prior to visit.     PAST MEDICAL HISTORY: Past Medical History:  Diagnosis Date  . Asthmatic bronchitis   . CAD (coronary artery disease)    bare metal stents to 2 separate RCA lesions August, 2011, residual 60% LAD, medical therapy  /  plan was for effient for one month followed by Plavix,, but patient does not tolerate Plavix... therefore patient placed back on effient  . Carotid artery disease ([place])   . Chronic back pain   . Chronic hip pain   . Dementia   . Dementia   . Diabetes mellitus    does not have  . Diverticulitis    remote hx  . Edema 03/29/2010   September, 2011  . Ejection fraction    EF 65 %... normal... catheterization 02/2010.  . Fracture of rib of left side 04/24/2011  . Gait abnormality   . GERD (gastroesophageal reflux disease)   . Hip fracture [facility])    October, 2012  . Hyperglycemia 04/24/2011  . Hypertension   . Hypothyroidism 04/24/2011  . Intolerance of drug    aspirin and plavix  . Mitral regurgitation    mild, echo, June, 2007  . Obesity   . Paroxysmal supraventricular tachycardia (H[facility])    infrequent episodes over  the years..palpitations.  . Sciatica     PAST SURGICAL HISTORY: Past Surgical History:  Procedure Laterality Date  . ANKLE FRACTURE SURGERY  12/1993   [place]  . ARTHROPLASTY W/ ARTHROSCOPY MEDIAL / LATERAL COMPARTMENT KNEE     [facility], should read arthroscopy not arthroplasty  . CARDIAC CATHETERIZATION  02/22/2010   2 stents, [place]  . ESOPHAGOGASTRODUODENOSCOPY  12/06/11   fields: Negative H. pylori, hiatal hernia/moderate gastritis  . ileocolonscopy  12/06/11   fields:diverticula throughtout the colon/inertnal hemorrhoids/  . KNEE ARTHROSCOPY  11/2001   left knee  .  KNEE ARTHROSCOPY  02/19/2011   Procedure: ARTHROSCOPY KNEE;  Surgeon: Sanjuana Kava;  Location: [facility];  Service: Orthopedics;  Laterality: Right;  Medial and Lateral Partial Menisectomy  . LAPAROSCOPIC CHOLECYSTECTOMY  04/29/1996   [place] [facility]  . ORIF HIP FRACTURE  04/25/2011   Procedure: OPEN REDUCTION INTERNAL FIXATION HIP;  Surgeon: Sanjuana Kava;  Location: [facility];  Service: Orthopedics;  Laterality: Left;  . SP ARTHRO THUMB*R*  05/1989   [place]  . TOTAL ABDOMINAL HYSTERECTOMY  1983   [place]  . TUBAL LIGATION  09/1970    FAMILY HISTORY: Family History  Problem Relation Age of Onset  . Diabetes Mother   . Arthritis Mother   . Heart attack Father   . Stroke Father   . Emphysema Father   . Cataracts Father   . Cancer Sister   . Heart disease Sister   . Arthritis Sister   . Arthritis Brother   . Allergies Brother   . Colon cancer Neg Hx     SOCIAL HISTORY: Social History   Social History  . Marital status: Widowed    Spouse name: N/A  . Number of children: 6  . Years of education: college   Occupational History  . [employer]     Education officer, museum   Social History Main Topics  . Smoking status: Never Smoker  . Smokeless tobacco: Never Used  . Alcohol use No     Comment: rarely  . Drug use: No  . Sexual activity: No   Other Topics Concern  . Not on file   Social History Narrative   .Retired Education officer, museum.     Patient has a Best boy.   Right handed.   Caffeine. None   Patient lives at home with her sister Bartolo Darter)            PHYSICAL EXAM  Vitals:   03/11/17 1238  BP: (!) 94/58  Pulse: 66  Weight: 148 lb 12.8 oz (67.5 kg)  Height: 5\' 2"  (1.575 m)   Body mass index is 27.22 kg/m.  Generalized: Well developed, in no acute distress  Head: normocephalic and atraumatic,. Oropharynx benign  Neck: Supple, no carotid bruits  Cardiac: Regular rate rhythm, no murmur  Musculoskeletal: No deformity   Neurological examination   Mentation: Alert  MMSE - Mini Mental State Exam 03/11/2017 09/11/2016 09/11/2015  Orientation to time 1 1 3   Orientation to Place 2 5 5   Registration 3 3 3   Attention/ Calculation 5 5 5   Recall 0 0 1  Language- name 2 objects 2 2 2   Language- repeat 0 1 1  Language- follow 3 step command 2 3 3   Language- read & follow direction 1 1 1   Write a sentence 1 1 1   Copy design 0 0 1  Total score 17 22 26   AFT 4. Clock drawing 2/4 Follows all commands speech and language fluent.   Cranial nerve II-XII: Pupils were equal round reactive to light extraocular movements were full, visual field were full on confrontational test. Facial sensation and strength were normal. hearing was intact to finger rubbing bilaterally. Uvula tongue midline. head turning and shoulder shrug were normal and symmetric.Tongue protrusion into cheek strength was normal. Motor: normal bulk and tone, full strength in the BUE, BLE, fine finger movements normal, no pronator drift. No focal weakness Sensory: decreased to light touch, pinprick, and  Vibration, to about ankles Coordination: finger-nose-finger, heel-to-shin bilaterally, no dysmetria Reflexes: Brachioradialis 2/2, biceps 2/2, triceps 2/2, patellar 2/2, Achilles 2/2,  plantar responses were flexor bilaterally. Gait and Station: in wheelchair today and ambulated due to safety concerns DIAGNOSTIC DATA (LABS, IMAGING, TESTING) - I  reviewed patient records, labs, notes, testing and imaging myself where available.  Lab Results  Component Value Date   WBC 10.5 02/03/2017   HGB 11.8 (L) 02/03/2017   HCT 37.0 02/03/2017   MCV 85.3 02/03/2017   PLT 334 02/03/2017      Component Value Date/Time   NA 134 (L) 02/25/2017 0931   K 3.1 (L) 02/25/2017 0931   CL 93 (L) 02/25/2017 0931   CO2 32 02/25/2017 0931   GLUCOSE 162 (H) 02/25/2017 0931   BUN 38 (H) 02/25/2017 0931   CREATININE 1.39 (H) 02/25/2017 0931   CALCIUM 9.0 02/25/2017 0931   PROT 8.4 (H) 01/13/2016 0758   ALBUMIN 3.0 (L) 01/13/2016 0758   AST 15 01/13/2016 0758   ALT 14 01/13/2016 0758   ALKPHOS 103 01/13/2016 0758   BILITOT 0.4 01/13/2016 0758   GFRNONAA 34 (L) 02/25/2017 0931   GFRAA 39 (L) 02/25/2017 0931     ASSESSMENT AND PLAN 83  years old Caucasian female with complex partial seizure disorder, and  memory loss episodes of confusion. MRI of the brain has demonstrates diffuse atrophy, moderate small vessel disease,   also one tiny acute stroke involving corpus callosum, which would not explain her current symptoms, her confusion episode has much improved with Keppra 500 twice a day, EEG was normal,   MMSE 17/30. Last 22/30.     PLAN: Continue Keppra 500mg  twice daily Add on Aricept 5mg  for memory Continue with Memory stimulating exercises Follow-up in 6 months for repeat Mini-Mental Status exam Patient or daughters are not interested in research study Use cane or walker at all times for safe ambulation I spent 30 min in total face to face time with the patient more than 50% of which was spent counseling and coordination of care, reviewing test results reviewing medications and discussing and reviewing the diagnosis of seizure disorder and memory loss and further treatment options. ,  Answered multiple questions for both daughters. Dennie Bible, [facility], [facility], APRN  [facility] [address], [place],  [place] [address] [phone]

## 2017-03-11 ENCOUNTER — Encounter (INDEPENDENT_AMBULATORY_CARE_PROVIDER_SITE_OTHER): Payer: Self-pay

## 2017-03-11 ENCOUNTER — Encounter: Payer: Self-pay | Admitting: Nurse Practitioner

## 2017-03-11 ENCOUNTER — Ambulatory Visit (INDEPENDENT_AMBULATORY_CARE_PROVIDER_SITE_OTHER): Payer: Medicare Other | Admitting: Nurse Practitioner

## 2017-03-11 VITALS — BP 94/58 | HR 66 | Ht 62.0 in | Wt 148.8 lb

## 2017-03-11 DIAGNOSIS — G309 Alzheimer's disease, unspecified: Secondary | ICD-10-CM | POA: Diagnosis not present

## 2017-03-11 DIAGNOSIS — F0390 Unspecified dementia without behavioral disturbance: Secondary | ICD-10-CM | POA: Insufficient documentation

## 2017-03-11 DIAGNOSIS — G40209 Localization-related (focal) (partial) symptomatic epilepsy and epileptic syndromes with complex partial seizures, not intractable, without status epilepticus: Secondary | ICD-10-CM

## 2017-03-11 DIAGNOSIS — F0280 Dementia in other diseases classified elsewhere without behavioral disturbance: Secondary | ICD-10-CM

## 2017-03-11 MED ORDER — DONEPEZIL HCL 5 MG PO TABS: 5.0000 mg | ORAL_TABLET | Freq: Every day | ORAL | 6 refills | Status: DC

## 2017-03-11 NOTE — Patient Instructions (Addendum)
Continue Keppra 500mg  twice daily Add on Aricept 5mg  for memory  Continue with Memory stimulating exercises Follow-up in 6 months for repeat Mini-Mental Status exam Use cane or walker at all times for safe ambulation

## 2017-03-12 NOTE — Progress Notes (Signed)
I have reviewed and agreed above plan. 

## 2017-03-13 ENCOUNTER — Ambulatory Visit (INDEPENDENT_AMBULATORY_CARE_PROVIDER_SITE_OTHER): Payer: Medicare Other | Admitting: Cardiovascular Disease

## 2017-03-13 ENCOUNTER — Encounter: Payer: Self-pay | Admitting: Cardiovascular Disease

## 2017-03-13 VITALS — BP 98/63 | HR 64 | Ht 62.0 in | Wt 148.0 lb

## 2017-03-13 DIAGNOSIS — I5031 Acute diastolic (congestive) heart failure: Secondary | ICD-10-CM

## 2017-03-13 DIAGNOSIS — R600 Localized edema: Secondary | ICD-10-CM

## 2017-03-13 DIAGNOSIS — Z955 Presence of coronary angioplasty implant and graft: Secondary | ICD-10-CM

## 2017-03-13 DIAGNOSIS — I2510 Atherosclerotic heart disease of native coronary artery without angina pectoris: Secondary | ICD-10-CM

## 2017-03-13 DIAGNOSIS — I10 Essential (primary) hypertension: Secondary | ICD-10-CM

## 2017-03-13 DIAGNOSIS — I471 Supraventricular tachycardia: Secondary | ICD-10-CM

## 2017-03-13 NOTE — Patient Instructions (Signed)
Your physician recommends that you schedule a follow-up appointment in: [place]  Your physician recommends that you continue on your current medications as directed. Please refer to the Current Medication list given to you today.  Your physician recommends that you return for lab work BMP  Thank you for choosing [facility]!!

## 2017-03-13 NOTE — Progress Notes (Signed)
SUBJECTIVE: The patient presents for follow-up of coronary artery disease and bilateral leg edema.  I started her on Lasix 40 mg.  Echocardiogram 02/10/17 demonstrated normal left ventricular systolic function and regional wall motion, LVEF 73-22%, grade 2 diastolic dysfunction with elevated filling pressures, mild mitral stenosis and regurgitation.  She reportedly underwent 2 separate interventions to the RCA in August 2011 and has some residual moderate disease in the LAD. She is allergic to aspirin and intolerant of Plavix and therefore remains on Effient.  Labs 02/25/17: BUN 38, creatinine 1.39, sodium 134, potassium 3.1.  She is feeling much better. Leg swelling has resolved.  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 Her sister taught as well. She used to swim in masters competitions.   Review of Systems: As per "subjective", otherwise negative.  Allergies  Allergen Reactions  . Celecoxib Other (See Comments)    TACHYCARDIA  . Codeine Other (See Comments)    TACHYCARIDIA  . Hydrocodone Other (See Comments)    TACHYCARDIA  . Tartrazine Other (See Comments)    Causes Tachycardia  . Aspirin     ONLY TARTRAZINE!!! Causes Tachycardia    Current Outpatient Prescriptions  Medication Sig Dispense Refill  . acetaminophen (TYLENOL) 500 MG tablet Take 1,000 mg by mouth every 6 (six) hours as needed for mild pain.    Marland Kitchen albuterol (PROVENTIL) (2.5 MG/3ML) 0.083% nebulizer solution Take 2.5 mg by nebulization daily as needed.    . ALPRAZolam (XANAX) 0.5 MG tablet Take 0.25-0.5 mg by mouth daily as needed. TAKE HALF TO ONE  A TABLET IF NEEDED FOR ANXIETY    . amLODipine (NORVASC) 10 MG tablet Take 10 mg by mouth every morning.     Marland Kitchen BREO ELLIPTA 100-25 MCG/INH AEPB Inhale 1 puff into the lungs daily.    . cetirizine (ZYRTEC) 10 MG tablet Take 5 mg by mouth daily.     . citalopram (CELEXA) 10 MG tablet Take 2  tablets (20 mg total) by mouth daily. (Patient taking differently: Take 10 mg by mouth daily. ) 60 tablet 11  . donepezil (ARICEPT) 5 MG tablet Take 1 tablet (5 mg total) by mouth at bedtime. 30 tablet 6  . esomeprazole (NEXIUM) 40 MG capsule TAKE 1 CAPSULE (40 MG TOTAL)  BY MOUTH DAILY BEFORE BREAKFAST. 30 capsule 5  . fluticasone (FLONASE) 50 MCG/ACT nasal spray Place 1 spray into both nostrils daily as needed for allergies.     . fluticasone (FLOVENT HFA) 44 MCG/ACT inhaler Inhale 1 puff into the lungs 2 (two) times daily as needed (Shortness of Breath).     . furosemide (LASIX) 40 MG tablet Take 1 tablet (40 mg total) by mouth daily. 90 tablet 0  . levETIRAcetam (KEPPRA) 500 MG tablet Take 1 tablet (500 mg total) by mouth 2 (two) times daily. 180 tablet 4  . Levothyroxine Sodium 100 MCG CAPS Take 100 mcg by mouth daily before breakfast.     . losartan (COZAAR) 25 MG tablet Take 25 mg by mouth daily.     Marland Kitchen losartan-hydrochlorothiazide (HYZAAR) 50-12.5 MG tablet TAKE 1 TABLET ONCE A DAY    . metoprolol succinate (TOPROL-XL) 100 MG 24 hr tablet Take 100 mg by mouth 2 (two) times daily. Take with or immediately following a meal.     . mometasone (NASONEX) 50 MCG/ACT nasal spray Place 2 sprays into the nose daily as needed (Congestion).     . nitroGLYCERIN (  NITROSTAT) 0.4 MG SL tablet Place 1 tablet (0.4 mg total) under the tongue every 5 (five) minutes as needed for chest pain. 25 tablet 3  . polyethylene glycol (MIRALAX / GLYCOLAX) packet Take 17 g by mouth daily as needed for mild constipation.     . Polyvinyl Alcohol-Povidone (MURINE TEARS FOR DRY EYES) 5-6 MG/ML SOLN Place 1 drop into both eyes every morning. As needed    . potassium chloride SA (K-DUR,KLOR-CON) 20 MEQ tablet Take 40 meq daily for next three days. 10 tablet 0  . prasugrel (EFFIENT) 5 MG TABS tablet Take 1 tablet (5 mg total) by mouth daily. 30 tablet 6  . traMADol (ULTRAM) 50 MG tablet Take 1 tablet (50 mg total) by mouth every  12 (twelve) hours as needed. for pain 60 tablet 0   No current facility-administered medications for this visit.     Past Medical History:  Diagnosis Date  . Asthmatic bronchitis   . CAD (coronary artery disease)    bare metal stents to 2 separate RCA lesions August, 2011, residual 60% LAD, medical therapy  /  plan was for effient for one month followed by Plavix,, but patient does not tolerate Plavix... therefore patient placed back on effient  . Carotid artery disease ([place])   . Chronic back pain   . Chronic hip pain   . Dementia   . Dementia   . Diabetes mellitus    does not have  . Diverticulitis    remote hx  . Edema 03/29/2010   September, 2011  . Ejection fraction    EF 65 %... normal... catheterization 02/2010.  . Fracture of rib of left side 04/24/2011  . Gait abnormality   . GERD (gastroesophageal reflux disease)   . Hip fracture [facility])    October, 2012  . Hyperglycemia 04/24/2011  . Hypertension   . Hypothyroidism 04/24/2011  . Intolerance of drug    aspirin and plavix  . Mitral regurgitation    mild, echo, June, 2007  . Obesity   . Paroxysmal supraventricular tachycardia (HCC)    infrequent episodes over the years..palpitations.  . Sciatica     Past Surgical History:  Procedure Laterality Date  . ANKLE FRACTURE SURGERY  12/1993   [place]  . ARTHROPLASTY W/ ARTHROSCOPY MEDIAL / LATERAL COMPARTMENT [facility]     [facility], should [facility]roscopy not [facility]plasty  . CARDIAC CATHETERIZATION  02/22/2010   2 stents, [place]  . ESOPHAGOGASTRODUODENOSCOPY  12/06/11   fields: Negative H. pylori, hiatal hernia/moderate gastritis  . ileocolonscopy  12/06/11   fields:diverticula throughtout the colon/inertnal hemorrhoids/  . KNEE ARTHROSCOPY  11/2001   left knee  . KNEE ARTHROSCOPY  02/19/2011   Procedure: ARTHROSCOPY KNEE;  Surgeon: Sanjuana Kava;  Location: [facility];  Service: Orthopedics;  Laterality: Right;  Medial and Lateral Partial Menisectomy  . LAPAROSCOPIC CHOLECYSTECTOMY  04/29/1996     [facility]  . ORIF HIP FRACTURE  04/25/2011   Procedure: OPEN REDUCTION INTERNAL FIXATION HIP;  Surgeon: Sanjuana Kava;  Location: [facility];  Service: Orthopedics;  Laterality: Left;  . SP ARTHRO THUMB*R*  05/1989   [place]  . TOTAL ABDOMINAL HYSTERECTOMY  1983   [place]  . TUBAL LIGATION  09/1970    Social History   Social History  . Marital status: Widowed    Spouse name: N/A  . Number of children: 6  . Years of education: college   Occupational History  . [employer]     Education officer, museum   Social History Main  Topics  . Smoking status: Never Smoker  . Smokeless tobacco: Never Used  . Alcohol use No     Comment: rarely  . Drug use: No  . Sexual activity: No   Other Topics Concern  . 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   Right handed.   Caffeine. None   Patient lives at home with her sister Bartolo Darter)            Vitals:   03/13/17 1303  BP: 98/63  Pulse: 64  Weight: 148 lb (67.1 kg)  Height: 5\' 2"  (1.575 m)    Wt Readings from Last 3 Encounters:  03/13/17 148 lb (67.1 kg)  03/11/17 148 lb 12.8 oz (67.5 kg)  02/05/17 153 lb (69.4 kg)     PHYSICAL EXAM General: NAD HEENT: Normal. Neck: No JVD, no thyromegaly. Lungs: Clear to auscultation bilaterally with normal respiratory effort. CV: Nondisplaced PMI.  Regular rate and rhythm, normal S1/S2, no S3/S4, no murmur. No pretibial or periankle edema.     Abdomen: Soft, nontender, no distention.  Neurologic: Alert and oriented.  Psych: Normal affect. Skin: Normal. Musculoskeletal: No gross deformities.    ECG: Most recent ECG reviewed.   Labs: Lab Results  Component Value Date/Time   K 3.1 (L) 02/25/2017 09:31 AM   BUN 38 (H) 02/25/2017 09:31 AM   CREATININE 1.39 (H) 02/25/2017 09:31 AM   ALT 14 01/13/2016 07:58 AM   TSH 2.445 04/24/2011 07:58 AM   HGB 11.8 (L) 02/03/2017 12:05 PM     Lipids: Lab Results  Component Value Date/Time    [facility]  02/23/2010 05:24 AM    62        Total Cholesterol/HDL:CHD Risk Coronary Heart Disease Risk Table                     Men   Women  1/2 Average Risk   3.4   3.3  Average Risk       5.0   4.4  2 X Average Risk   9.6   7.1  3 X Average Risk  23.4   11.0        Use the calculated Patient Ratio above and the CHD Risk Table to determine the patient's CHD Risk.        ATP III CLASSIFICATION (LDL):  <100     mg/dL   Optimal  100-129  mg/dL   Near or Above                    Optimal  130-159  mg/dL   Borderline  160-189  mg/dL   High  >190     mg/dL   Very High   CHOL  02/23/2010 05:24 AM    132        ATP III CLASSIFICATION:  <200     mg/dL   Desirable  200-239  mg/dL   Borderline High  >=240    mg/dL   High          TRIG 184 (H) 02/23/2010 05:24 AM   HDL 33 (L) 02/23/2010 05:24 AM       ASSESSMENT AND PLAN:  1. CAD: Symptomatically stable. Continue Effient and metoprolol.   2. Essential HTN: Controlled. No changes.  3. PSVT: Symptomatically stable on metoprolol. No changes.  4. Acute diastolic heart failure/bilateral leg edema: Resolved. Wt down 5 lbs from 02/05/17. Continue  Lasix 40 mg daily. Will check BMET. She probably also has a component of venous insufficiency in which case leg elevation and compression stockings are warranted.     Disposition: Follow up 3 months.   Kate Sable, M.D., F.A.C.C.

## 2017-03-18 ENCOUNTER — Telehealth: Payer: Self-pay | Admitting: Nurse Practitioner

## 2017-03-18 NOTE — Telephone Encounter (Signed)
Patient's son calling stating since taking donepezil (ARICEPT) 5 MG tablet the patient stays nauseated, muscle cramps, loss of appetite with some diarrhea. Please call and discuss.

## 2017-03-18 NOTE — Telephone Encounter (Signed)
I called and spoke to son.  Pt has been taking the donepezil 5mg  po qhs. for the last 6 days.  She is having diarrhea, some nausea, decreased apptetite, decreased energy level.  I told her to stop and see if she has resolution of these sx.  I will forward to CM/NP to let her know.  If any other changes will call them back.

## 2017-03-18 NOTE — Telephone Encounter (Signed)
Call us back in a week to see if symptoms resolved. If continued not the med

## 2017-03-19 NOTE — Telephone Encounter (Signed)
Called and VM not set up yet

## 2017-03-19 NOTE — Telephone Encounter (Signed)
Spoke to son, pt is already doing better from being off the medications, donezepil.  I told him to call back in a week and let us know how she is doing.  He stated he will.  Pcp, cardiology is following her potassium , she is to have labs done in the next day or two.

## 2017-03-19 NOTE — Telephone Encounter (Signed)
Pt's son returned RN's call

## 2017-03-19 NOTE — Telephone Encounter (Addendum)
Pt's son [phone]) called said the medication was d/c yesterday as instructed. Today he is asking if the medication could effect her potassium levels. He said she drinks water and ensure and eats 1/2 banana daily. Please call to discuss. He will also call her cardiologist regarding potassium levels

## 2017-06-16 ENCOUNTER — Other Ambulatory Visit (HOSPITAL_COMMUNITY)
Admission: RE | Admit: 2017-06-16 | Discharge: 2017-06-16 | Disposition: A | Discharge status: Home / Self Care | Payer: Medicare Other | Source: Ambulatory Visit | Attending: Cardiovascular Disease | Admitting: Cardiovascular Disease

## 2017-06-16 DIAGNOSIS — I10 Essential (primary) hypertension: Secondary | ICD-10-CM | POA: Insufficient documentation

## 2017-06-16 LAB — BASIC METABOLIC PANEL
Anion gap: 10 (ref 5–15)
BUN: 33 mg/dL — AB (ref 6–20)
CHLORIDE: 97 mmol/L — AB (ref 101–111)
CO2: 28 mmol/L (ref 22–32)
CREATININE: 1.4 mg/dL — AB (ref 0.44–1.00)
Calcium: 9.1 mg/dL (ref 8.9–10.3)
GFR calc Af Amer: 39 mL/min — ABNORMAL LOW (ref >60)
GFR calc non Af Amer: 34 mL/min — ABNORMAL LOW (ref >60)
GLUCOSE: 116 mg/dL — AB (ref 65–99)
POTASSIUM: 2.9 mmol/L — AB (ref 3.5–5.1)
SODIUM: 135 mmol/L (ref 135–145)

## 2017-06-17 ENCOUNTER — Telehealth: Payer: Self-pay

## 2017-06-17 DIAGNOSIS — E876 Hypokalemia: Secondary | ICD-10-CM

## 2017-06-17 MED ORDER — POTASSIUM CHLORIDE CRYS ER 20 MEQ PO TBCR: 40.0000 meq | EXTENDED_RELEASE_TABLET | Freq: Every day | ORAL | 6 refills | Status: DC

## 2017-06-17 NOTE — Telephone Encounter (Signed)
Start 40 meq daily and repeat K in one week.

## 2017-06-17 NOTE — Telephone Encounter (Signed)
-----   Message from Laurine Blazer, LPN sent at 08/81/1031 10:50 AM EST -----   ----- Message ----- From: Massie Maroon, CMA Sent: 06/17/2017   9:24 AM To: Laurine Blazer, LPN    ----- Message ----- From: Herminio Commons, MD Sent: 06/17/2017   9:19 AM To: Massie Maroon, CMA  K is low. How much is she taking?

## 2017-06-17 NOTE — Telephone Encounter (Signed)
Son did not know if patient was taking potassium, will have his Aunt Flo call me back.She handles pt's medicine

## 2017-06-17 NOTE — Telephone Encounter (Signed)
E-scribed potassium to [facility], will keep lab slip at front desk for patient to pick up at [place] in 2 days.Son informed of potassium 40 meq daily dose

## 2017-06-17 NOTE — Telephone Encounter (Signed)
Patient last took prescription strength potassium in August.Son states she was put on OTC potassium.

## 2017-06-19 ENCOUNTER — Ambulatory Visit (INDEPENDENT_AMBULATORY_CARE_PROVIDER_SITE_OTHER): Payer: Medicare Other | Admitting: Cardiovascular Disease

## 2017-06-19 ENCOUNTER — Encounter: Payer: Self-pay | Admitting: Cardiovascular Disease

## 2017-06-19 VITALS — BP 104/64 | HR 79 | Ht 61.0 in | Wt 145.0 lb

## 2017-06-19 DIAGNOSIS — I471 Supraventricular tachycardia: Secondary | ICD-10-CM

## 2017-06-19 DIAGNOSIS — I10 Essential (primary) hypertension: Secondary | ICD-10-CM | POA: Diagnosis not present

## 2017-06-19 DIAGNOSIS — Z955 Presence of coronary angioplasty implant and graft: Secondary | ICD-10-CM | POA: Diagnosis not present

## 2017-06-19 DIAGNOSIS — R600 Localized edema: Secondary | ICD-10-CM | POA: Diagnosis not present

## 2017-06-19 DIAGNOSIS — E876 Hypokalemia: Secondary | ICD-10-CM

## 2017-06-19 DIAGNOSIS — I25118 Atherosclerotic heart disease of native coronary artery with other forms of angina pectoris: Secondary | ICD-10-CM | POA: Diagnosis not present

## 2017-06-19 DIAGNOSIS — I5032 Chronic diastolic (congestive) heart failure: Secondary | ICD-10-CM

## 2017-06-19 NOTE — Progress Notes (Signed)
SUBJECTIVE: The patient presents for follow-up of coronary artery disease and bilateral leg edema.  Echocardiogram 02/10/17 demonstrated normal left ventricular systolic function and regional wall motion, LVEF 82-99%, grade 2 diastolic dysfunction with elevated filling pressures, mild mitral stenosis and regurgitation.  She reportedly underwent 2 separate interventions to the RCA in August 2011 and has some residual moderate disease in the LAD. She is allergic to aspirin and intolerant of Plavix and therefore remains on Effient.  Labs 06/16/17: Potassium markedly low at 2.9, BUN 33, creatinine 1.4. I started her on potassium supplementation.  She denies chest pain, palpitations, shortness of breath, and leg swelling.    She got a new dog.  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 She used to swim in masters competitions.   Review of Systems: As per "subjective", otherwise negative.  Allergies  Allergen Reactions  . Aricept [Donepezil Hcl]     All side effects  . Celecoxib Other (See Comments)    TACHYCARDIA  . Codeine Other (See Comments)    TACHYCARIDIA  . Hydrocodone Other (See Comments)    TACHYCARDIA  . Tartrazine Other (See Comments)    Causes Tachycardia  . Aspirin     ONLY TARTRAZINE!!! Causes Tachycardia    Current Outpatient Medications  Medication Sig Dispense Refill  . acetaminophen (TYLENOL) 500 MG tablet Take 1,000 mg by mouth every 6 (six) hours as needed for mild pain.    Marland Kitchen albuterol (PROVENTIL) (2.5 MG/3ML) 0.083% nebulizer solution Take 2.5 mg by nebulization daily as needed.    . ALPRAZolam (XANAX) 0.5 MG tablet Take 0.25-0.5 mg by mouth daily as needed. TAKE HALF TO ONE  A TABLET IF NEEDED FOR ANXIETY    . amLODipine (NORVASC) 10 MG tablet Take 10 mg by mouth every morning.       Marland Kitchen BREO ELLIPTA 100-25 MCG/INH AEPB Inhale 1 puff into the lungs daily.    . cetirizine (ZYRTEC) 10 MG tablet Take 5 mg by mouth daily.     . citalopram (CELEXA) 10 MG tablet Take 2 tablets (20 mg total) by mouth daily. (Patient taking differently: Take 10 mg by mouth daily. ) 60 tablet 11  . esomeprazole (NEXIUM) 40 MG capsule TAKE 1 CAPSULE (40 MG TOTAL)  BY MOUTH DAILY BEFORE BREAKFAST. 30 capsule 5  . fluticasone (FLONASE) 50 MCG/ACT nasal spray Place 1 spray into both nostrils daily as needed for allergies.     . fluticasone (FLOVENT HFA) 44 MCG/ACT inhaler Inhale 1 puff into the lungs 2 (two) times daily as needed (Shortness of Breath).     . furosemide (LASIX) 40 MG tablet Take 1 tablet (40 mg total) by mouth daily. 90 tablet 0  . levETIRAcetam (KEPPRA) 500 MG tablet Take 1 tablet (500 mg total) by mouth 2 (two) times daily. 180 tablet 4  . Levothyroxine Sodium 100 MCG CAPS Take 100 mcg by mouth daily before breakfast.     . losartan (COZAAR) 25 MG tablet Take 25 mg by mouth daily.     Marland Kitchen losartan-hydrochlorothiazide (HYZAAR) 50-12.5 MG tablet TAKE 1 TABLET ONCE A DAY    . metoprolol succinate (TOPROL-XL) 100 MG 24 hr tablet Take 100 mg by mouth 2 (two) times daily. Take with or immediately following a meal.     . mometasone (  NASONEX) 50 MCG/ACT nasal spray Place 2 sprays into the nose daily as needed (Congestion).     . nitroGLYCERIN (NITROSTAT) 0.4 MG SL tablet Place 1 tablet (0.4 mg total) under the tongue every 5 (five) minutes as needed for chest pain. 25 tablet 3  . polyethylene glycol (MIRALAX / GLYCOLAX) packet Take 17 g by mouth daily as needed for mild constipation.     . Polyvinyl Alcohol-Povidone (MURINE TEARS FOR DRY EYES) 5-6 MG/ML SOLN Place 1 drop into both eyes every morning. As needed    . potassium chloride SA (K-DUR,KLOR-CON) 20 MEQ tablet Take 2 tablets (40 mEq total) by mouth daily. 60 tablet 6  . prasugrel (EFFIENT) 5 MG TABS tablet Take 1 tablet (5 mg total) by mouth  daily. 30 tablet 6  . traMADol (ULTRAM) 50 MG tablet Take 1 tablet (50 mg total) by mouth every 12 (twelve) hours as needed. for pain 60 tablet 0   No current facility-administered medications for this visit.     Past Medical History:  Diagnosis Date  . Asthmatic bronchitis   . CAD (coronary artery disease)    bare metal stents to 2 separate RCA lesions August, 2011, residual 60% LAD, medical therapy  /  plan was for effient for one month followed by Plavix,, but patient does not tolerate Plavix... therefore patient placed back on effient  . Carotid artery disease ([place])   . Chronic back pain   . Chronic hip pain   . Dementia   . Dementia   . Diabetes mellitus    does not have  . Diverticulitis    remote hx  . Edema 03/29/2010   September, 2011  . Ejection fraction    EF 65 %... normal... catheterization 02/2010.  . Fracture of rib of left side 04/24/2011  . Gait abnormality   . GERD (gastroesophageal reflux disease)   . Hip fracture [facility])    October, 2012  . Hyperglycemia 04/24/2011  . Hypertension   . Hypothyroidism 04/24/2011  . Intolerance of drug    aspirin and plavix  . Mitral regurgitation    mild, echo, June, 2007  . Obesity   . Paroxysmal supraventricular tachycardia (HCC)    infrequent episodes over the years..palpitations.  . Sciatica     Past Surgical History:  Procedure Laterality Date  . ANKLE FRACTURE SURGERY  12/1993   [place]  . ARTHROPLASTY W/ ARTHROSCOPY MEDIAL / LATERAL COMPARTMENT [facility]     [facility] should read [facility]roscopy not arthroplasty  . CARDIAC CATHETERIZATION  02/22/2010   2 stents, [place]  . ESOPHAGOGASTRODUODENOSCOPY  12/06/11   fields: Negative H. pylori, hiatal hernia/moderate gastritis  . ileocolonscopy  12/06/11   fields:diverticula throughtout the colon/inertnal hemorrhoids/  . KNEE ARTHROSCOPY  11/2001   left knee  . KNEE ARTHROSCOPY  02/19/2011   Procedure: ARTHROSCOPY KNEE;  Surgeon: Sanjuana Kava;  Location: [facility];  Service: Orthopedics;   Laterality: Right;  Medial and Lateral Partial Menisectomy  . LAPAROSCOPIC CHOLECYSTECTOMY  04/29/1996   [facility]  . ORIF HIP FRACTURE  04/25/2011   Procedure: OPEN REDUCTION INTERNAL FIXATION HIP;  Surgeon: Sanjuana Kava;  Location: [facility];  Service: Orthopedics;  Laterality: Left;  . SP ARTHRO THUMB*R*  05/1989   [place]  . TOTAL ABDOMINAL HYSTERECTOMY  1983   [place]  . TUBAL LIGATION  09/1970    Social History   Socioeconomic History  . Marital status: Widowed    Spouse name: Not on file  . Number of children: 6  .  Years of education: college  . Highest education level: Not on file  Social Needs  . Financial resource strain: Not on file  . Food insecurity - worry: Not on file  . Food insecurity - inability: Not on file  . Transportation needs - medical: Not on file  . Transportation needs - non-medical: Not on file  Occupational History  . Occupation: Reitred    CommentAdvertising copywriter  Tobacco Use  . Smoking status: Never Smoker  . Smokeless tobacco: Never Used  Substance and Sexual Activity  . Alcohol use: No    Alcohol/week: 0.6 oz    Types: 1 Glasses of wine per week    Comment: rarely  . Drug use: No  . Sexual activity: No  Other Topics Concern  . Not on file  Social History Narrative   .Retired Education officer, museum.    Patient has a Best boy.   Right handed.   Caffeine. None   Patient lives at home with her sister Bartolo Darter)            Vitals:   06/19/17 1101  BP: 104/64  Pulse: 79  SpO2: 97%  Weight: 145 lb (65.8 kg)  Height: 5\' 1"  (1.549 m)    Wt Readings from Last 3 Encounters:  06/19/17 145 lb (65.8 kg)  03/13/17 148 lb (67.1 kg)  03/11/17 148 lb 12.8 oz (67.5 kg)     PHYSICAL EXAM General: NAD HEENT: Normal. Neck: No JVD, no thyromegaly. Lungs: Clear to auscultation bilaterally with normal respiratory effort. CV: Regular rate and rhythm, normal S1/S2, no S3/S4, no murmur. No pretibial or periankle edema. Abdomen: Soft, nontender, no  distention.  Neurologic: Alert and oriented.  Psych: Normal affect. Skin: Normal. Musculoskeletal: No gross deformities.    ECG: Most recent ECG reviewed.   Labs: Lab Results  Component Value Date/Time   K 2.9 (L) 06/16/2017 09:33 AM   BUN 33 (H) 06/16/2017 09:33 AM   CREATININE 1.40 (H) 06/16/2017 09:33 AM   ALT 14 01/13/2016 07:58 AM   TSH 2.445 04/24/2011 07:58 AM   HGB 11.8 (L) 02/03/2017 12:05 PM     Lipids: Lab Results  Component Value Date/Time   [facility]  02/23/2010 05:24 AM    62        Total Cholesterol/HDL:CHD Risk Coronary Heart Disease Risk Table                     Men   Women  1/2 Average Risk   3.4   3.3  Average Risk       5.0   4.4  2 X Average Risk   9.6   7.1  3 X Average Risk  23.4   11.0        Use the calculated Patient Ratio above and the CHD Risk Table to determine the patient's CHD Risk.        ATP III CLASSIFICATION (LDL):  <100     mg/dL   Optimal  100-129  mg/dL   Near or Above                    Optimal  130-159  mg/dL   Borderline  160-189  mg/dL   High  >190     mg/dL   Very High   CHOL  02/23/2010 05:24 AM    132        ATP III CLASSIFICATION:  <200     mg/dL   Desirable  200-239  mg/dL   Borderline High  >=240    mg/dL   High          TRIG 184 (H) 02/23/2010 05:24 AM   HDL 33 (L) 02/23/2010 05:24 AM       ASSESSMENT AND PLAN: 1. CAD: Symptomatically stable. Continue Effient (she is allergic to aspirin and intolerant of Plavix and therefore remains on Effient) and metoprolol.   2. Essential HTN: Controlled. No changes.  3. PSVT: Symptomatically stable on metoprolol. No changes.  4. Chronic diastolic heart failure/bilateralleg edema: Resolved. Wt down 3 lbs from 02/2017. Continue Lasix 40 mg daily and KCl. She probably also has a component of venous insufficiency in which case leg elevation and compression stockings are warranted.  5.  Hypokalemia: I started her on potassium supplementation with a repeat basic  metabolic panel in the near future.    Disposition: Follow up 6 months.   Kate Sable, M.D., F.A.C.C.

## 2017-06-19 NOTE — Patient Instructions (Signed)
Your physician wants you to follow-up in: 6 months with Dr Koneswaran You will receive a reminder letter in the mail two months in advance. If you don't receive a letter, please call our office to schedule the follow-up appointment.    Your physician recommends that you continue on your current medications as directed. Please refer to the Current Medication list given to you today.    If you need a refill on your cardiac medications before your next appointment, please call your pharmacy.      No tests ordered today.       Thank you for choosing [facility] !        

## 2017-06-23 ENCOUNTER — Other Ambulatory Visit: Payer: Self-pay | Admitting: Cardiovascular Disease

## 2017-07-07 ENCOUNTER — Other Ambulatory Visit (HOSPITAL_COMMUNITY)
Admission: RE | Admit: 2017-07-07 | Discharge: 2017-07-07 | Disposition: A | Discharge status: Home / Self Care | Payer: Medicare Other | Source: Ambulatory Visit | Attending: Cardiovascular Disease | Admitting: Cardiovascular Disease

## 2017-07-07 DIAGNOSIS — E876 Hypokalemia: Secondary | ICD-10-CM | POA: Diagnosis present

## 2017-07-07 LAB — POTASSIUM: Potassium: 4.1 mmol/L (ref 3.5–5.1)

## 2017-09-11 ENCOUNTER — Ambulatory Visit: Payer: Medicare Other | Admitting: Nurse Practitioner

## 2017-09-27 ENCOUNTER — Other Ambulatory Visit: Payer: Self-pay | Admitting: Neurology

## 2017-10-20 ENCOUNTER — Other Ambulatory Visit: Payer: Self-pay | Admitting: Neurology

## 2017-10-20 NOTE — Progress Notes (Signed)
[facility]  PATIENT: Katie Ford DOB: 27-Mar-1932   REASON FOR VISIT: Follow-up for seizure disorder and memory loss  HISTORY FROM: patient and grandson Katie Ford    HISTORY OF PRESENT ILLNESS:Katie Ford is a 84  years old right-handed Caucasian female, accompanied by her sister, four daughters, referred by emergency room, and her primary care physician Dr. Deirdre Pippins for evaluation of recent onset confusion spells  She had a past medical history of anxiety, mitral valve regurgitation, coronary artery disease,cardiologist is Dr. Ron Parker, she is on effient, digoxin treatment.  She is a retired Careers information officer, remained to be physically active, including swimming regularly, she did has mild gait difficulty after her left hip fracture, and fixation surgery in  2012, she lives with her sister, prior her most recent hospital presentation in July 11 2013,she is very active, balance her checkbook,drive long distance without difficulty.  In July 11 2013, she was noticed to have difficulty sorted out her medications, got very frustrated, she complains of not feeling well, went to bed early that night, woke up around 1 AM in the morning, confused, she went to her sister's bedroom, on asking her where she is, later she went to bed, was noted by her family December 21st increased confusion, did not know where she is, she was taken to hospital, CAT scan of the brain showed previous old stroke in the left basal ganglion, chronic sinusitis, small vessel disease, no acute stroke,  Ultrasound of carotid arteries showed less than 50% stenosis of bilateral internal carotid artery  Bilateral lower extremity venous Doppler showed no DVT in bilateral lower extremity,  Bilateral hip X-ray demonstrated previous left hip surgery, no evidence of acute process,  She was treated with antibiotics Keflex for sinusitis, over the past 2 weeks, she has improved, but not back to her baseline,  she continued to have intermittent episodes of confusion, eye blinking spells, lasting for a few minutes, followed by extreme fatigue, tends to sleep afterwards  I have reviewed MRI together with patient and her family, MRI of the brain has demonstrated positive diffusion imaging shows a 5 mm focus of acute infarction in the midline body of the corpus callosum. No other acute infarction.  There chronic small-vessel changes affecting the pons.  There are moderate changes of chronic small vessel disease affecting the deep and subcortical cerebral hemispheric white matter. There was diffuse atrophy  She denies a previous history of seizure  UPDATE Sep 02, 2013:YY MRA of the brain was normal.    She fell, was taken to the emergency room, had MRI of pelvic showed no acute findings related to recent fall. Moderate right gluteus minimus tendinosis with adjacent bursal fluid. No tendon tear demonstrated. Left pelvic muscular atrophy related to remote left proximal femoral ORIF.  Lower lumbar spondylosis with chronic resulting foraminal  Stenosis.  MRI cervical in December 2014 Spondylosis at C5-6 contributes to mild central and moderate biforaminal stenosis.  Sometimes, urinary urgency since 2012, no change.   She was started on Keppra, 500mg  bid, she feels more alert, is also confirmed by her sister,  no more confusion episodes, no more spells  UPDATE Sep 02 2014:YY She is now taking Keppra 500mg  bid, no more seizure like event, no side effect, still swimming.  She is driving some, word finding difficulty, short memory trouble, she lives with her sister,   UPDATE Sep 11 2015:YY She still swim, her low lumbar spine compression fracture  I have personally reviewed MRI lumbar in  Dec 2016: Severe degenerative lumbar spondylosis with multilevel disc disease and facet disease, severe multifactorial spinal stenosis and bilateral recess stenosis at L3-4, L4-5, broad-based left foraminal  extraforaminal disc protrusion at L4-5 with significant left foraminal stenosis, Stable lower thoracic degenerative disc disease. Persistent spinal stenosis and mild malacia involving the lower thoracic cord at T11-12.  She has significant gait difficulty, low back pain, no recurrent seizure, tolerating Keppra 500 mg twice a day, continue has mild memory trouble  UPDATE Sep 11 2016:YY She is accompanied by her daughter and grandson at today's clinical visit, she had no recurrent seizure, but she had slight worsening memory loss, also becoming easily agitated, throw hot pan at her family members, chronic low back pain, hip pain, mild gait abnormality. UPDATE 08/21/2018CM Katie Ford, 84 year old female returns for follow-up with history of seizure disorder and memory loss. Her memory has continued to decline. She has not had further seizure activity. She was placed on Seroquel and Remeron at her last visit with Dr. Krista Blue. Those medications were not started. She had a daughter die recently at the age of 79 unexpectedly. She continues to have low back pain and gait abnormality.No recent falls. Ambulates with a quad cane. Family members assist patient in her home.  UPDATE 4/2/2019CM Katie Ford, 84 year old female returns for follow-up with history of seizure disorder and memory loss.  Her memory has continued to decline at last visit and she was placed on Aricept but her son called in and stated she was having diarrhea on the drug so it was stopped.  She has been tried on Seroquel and Remeron in the past by Dr. Krista Blue but never started the medication she has significant back pain and gait abnormality.  She is seated in a wheelchair today with stooped posture.  She is with her grandson Katie Ford with whom she lives.  Appetite is good and she denies any difficulty sleeping at night she has had several falls in the last 6 months she has not had any injury.  She continues to ambulate with a quad cane or rolling walker.   Anytime she tries to get up without assistive device and that is why she falls.  She returns for reevaluation REVIEW OF SYSTEMS: Full 14 system review of systems performed and notable only for those listed, all others are neg:  Constitutional: Fatigue   Cardiovascular: neg Ear/Nose/Throat: neg  Skin: neg Eyes: Blurred vision Respiratory: Chronic cough Gastroitestinal: neg  Hematology/Lymphatic: neg  Endocrine: neg Musculoskeletal:chronic back pain, gait disorder Allergy/Immunology: neg Neurological: memory loss, seizure disorder Psychiatric: neg Sleep : neg   ALLERGIES: Allergies  Allergen Reactions  . Aricept [Donepezil Hcl]     All side effects  . Celecoxib Other (See Comments)    TACHYCARDIA  . Codeine Other (See Comments)    TACHYCARIDIA  . Hydrocodone Other (See Comments)    TACHYCARDIA  . Tartrazine Other (See Comments)    Causes Tachycardia  . Aspirin     ONLY TARTRAZINE!!! Causes Tachycardia    HOME MEDICATIONS: Outpatient Medications Prior to Visit  Medication Sig Dispense Refill  . albuterol (PROVENTIL) (2.5 MG/3ML) 0.083% nebulizer solution Take 2.5 mg by nebulization daily as needed.    . ALPRAZolam (XANAX) 0.5 MG tablet Take 0.25-0.5 mg by mouth daily as needed. TAKE HALF TO ONE  A TABLET IF NEEDED FOR ANXIETY    . amLODipine (NORVASC) 10 MG tablet Take 10 mg by mouth every morning.     Marland Kitchen BREO ELLIPTA 100-25 MCG/INH AEPB Inhale  1 puff into the lungs daily.    . cetirizine (ZYRTEC) 10 MG tablet Take 5 mg by mouth daily.     . citalopram (CELEXA) 10 MG tablet Take 2 tablets (20 mg total) by mouth daily. 60 tablet 11  . esomeprazole (NEXIUM) 40 MG capsule TAKE 1 CAPSULE (40 MG TOTAL)  BY MOUTH DAILY BEFORE BREAKFAST. 30 capsule 5  . fluticasone (FLONASE) 50 MCG/ACT nasal spray Place 1 spray into both nostrils daily as needed for allergies.     . fluticasone (FLOVENT HFA) 44 MCG/ACT inhaler Inhale 1 puff into the lungs 2 (two) times daily as needed (Shortness of  Breath).     . furosemide (LASIX) 40 MG tablet TAKE 1 TABLET DAILY 90 tablet 3  . levETIRAcetam (KEPPRA) 500 MG tablet Take 1 tablet (500 mg total) by mouth 2 (two) times daily. 180 tablet 0  . losartan-hydrochlorothiazide (HYZAAR) 50-12.5 MG tablet TAKE 1 TABLET ONCE A DAY    . metoprolol succinate (TOPROL-XL) 100 MG 24 hr tablet Take 100 mg by mouth 2 (two) times daily. Take with or immediately following a meal.     . mometasone (NASONEX) 50 MCG/ACT nasal spray Place 2 sprays into the nose daily as needed (Congestion).     . nitroGLYCERIN (NITROSTAT) 0.4 MG SL tablet Place 1 tablet (0.4 mg total) under the tongue every 5 (five) minutes as needed for chest pain. 25 tablet 3  . polyethylene glycol (MIRALAX / GLYCOLAX) packet Take 17 g by mouth daily as needed for mild constipation.     . Polyvinyl Alcohol-Povidone (MURINE TEARS FOR DRY EYES) 5-6 MG/ML SOLN Place 1 drop into both eyes every morning. As needed    . potassium chloride SA (K-DUR,KLOR-CON) 20 MEQ tablet Take 2 tablets (40 mEq total) by mouth daily. 60 tablet 6  . prasugrel (EFFIENT) 5 MG TABS tablet Take 1 tablet (5 mg total) by mouth daily. 30 tablet 6  . traMADol (ULTRAM) 50 MG tablet Take 1 tablet (50 mg total) by mouth every 12 (twelve) hours as needed. for pain 60 tablet 0  . acetaminophen (TYLENOL) 500 MG tablet Take 1,000 mg by mouth every 6 (six) hours as needed for mild pain.    . Levothyroxine Sodium 100 MCG CAPS Take 100 mcg by mouth daily before breakfast.     . losartan (COZAAR) 25 MG tablet Take 25 mg by mouth daily.      No facility-administered medications prior to visit.     PAST MEDICAL HISTORY: Past Medical History:  Diagnosis Date  . Asthmatic bronchitis   . CAD (coronary artery disease)    bare metal stents to 2 separate RCA lesions August, 2011, residual 60% LAD, medical therapy  /  plan was for effient for one month followed by Plavix,, but patient does not tolerate Plavix... therefore patient placed back on  effient  . Carotid artery disease ([place])   . Chronic back pain   . Chronic hip pain   . Dementia   . Dementia   . Diabetes mellitus    does not have  . Diverticulitis    remote hx  . Edema 03/29/2010   September, 2011  . Ejection fraction    EF 65 %... normal... catheterization 02/2010.  . Fracture of rib of left side 04/24/2011  . Gait abnormality   . GERD (gastroesophageal reflux disease)   . Hip fracture [facility])    October, 2012  . Hyperglycemia 04/24/2011  . Hypertension   . Hypothyroidism 04/24/2011  .  Intolerance of drug    aspirin and plavix  . Mitral regurgitation    mild, echo, June, 2007  . Obesity   . Paroxysmal supraventricular tachycardia (HCC)    infrequent episodes over the years..palpitations.  . Sciatica     PAST SURGICAL HISTORY: Past Surgical History:  Procedure Laterality Date  . ANKLE FRACTURE SURGERY  12/1993   [place]  . ARTHROPLASTY W/ ARTHROSCOPY MEDIAL / LATERAL COMPARTMENT KNEE     [facility], should read arthroscopy not arthroplasty  . CARDIAC CATHETERIZATION  02/22/2010   2 stents, [place]  . ESOPHAGOGASTRODUODENOSCOPY  12/06/11   fields: Negative H. pylori, hiatal hernia/moderate gastritis  . ileocolonscopy  12/06/11   fields:diverticula throughtout the colon/inertnal hemorrhoids/  . KNEE ARTHROSCOPY  11/2001   left knee  . KNEE ARTHROSCOPY  02/19/2011   Procedure: ARTHROSCOPY KNEE;  Surgeon: Sanjuana Kava;  Location: [facility];  Service: Orthopedics;  Laterality: Right;  Medial and Lateral Partial Menisectomy  . LAPAROSCOPIC CHOLECYSTECTOMY  04/29/1996   [place] [facility]  . ORIF HIP FRACTURE  04/25/2011   Procedure: OPEN REDUCTION INTERNAL FIXATION HIP;  Surgeon: Sanjuana Kava;  Location: [facility];  Service: Orthopedics;  Laterality: Left;  . SP ARTHRO THUMB*R*  05/1989   [place]  . TOTAL ABDOMINAL HYSTERECTOMY  1983   [place]  . TUBAL LIGATION  09/1970    FAMILY HISTORY: Family History  Problem Relation Age of Onset  . Diabetes Mother   . Arthritis Mother   . Heart  attack Father   . Stroke Father   . Emphysema Father   . Cataracts Father   . Cancer Sister   . Heart disease Sister   . Arthritis Sister   . Arthritis Brother   . Allergies Brother   . Colon cancer Neg Hx     SOCIAL HISTORY: Social History   Socioeconomic History  . Marital status: Widowed    Spouse name: Not on file  . Number of children: 6  . Years of education: college  . Highest education level: Not on file  Occupational History  . 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 Financial resource strain: Not on file  . Food insecurity:    Worry: Not on file    Inability: Not on file  . Transportation needs:    Medical: Not on file    Non-medical: Not on file  Tobacco Use  . Smoking status: Never Smoker  . Smokeless tobacco: Never Used  Substance and Sexual Activity  . Alcohol use: No    Alcohol/week: 0.6 oz    Types: 1 Glasses of wine per week    Comment: rarely  . Drug use: No  . Sexual activity: Never  Lifestyle  . Physical activity:    Days per week: Not on file    Minutes per session: Not on file  . Stress: Not on file  Relationships  . Social connections:    Talks on phone: Not on file    Gets together: Not on file    Attends religious service: Not on file    Active member of club or organization: Not on file    Attends meetings of clubs or organizations: Not on file    Relationship status: Not on file  . Intimate partner violence:    Fear of current or ex partner: Not on file    Emotionally abused: Not on file    Physically abused: Not on file    Forced sexual activity: Not  on file  Other Topics Concern  . Not on file  Social History Narrative   .Retired Education officer, museum.    Patient has a Best boy.   Right handed.   Caffeine. None   Patient lives at home with her sister Bartolo Darter)            PHYSICAL EXAM  Vitals:   10/21/17 1415  BP: 93/60  Pulse: 68  Weight: 129 lb (58.5 kg)  Height: 5\' 1"  (1.549 m)    Body mass index is 24.37 kg/m.  Generalized: Well developed, in no acute distress  Head: normocephalic and atraumatic,. Oropharynx benign  Neck: Supple,  Musculoskeletal: No deformity   Neurological examination   Mentation: Alert provides some of history, and relies on grandson.  Follows commands speech and language fluent. MMSE not done  Cranial nerve II-XII: Pupils were equal round reactive to light extraocular movements were full, visual field were full on confrontational test. Facial sensation and strength were normal. hearing was intact to finger rubbing bilaterally. Uvula tongue midline. head turning and shoulder shrug were normal and symmetric.Tongue protrusion into cheek strength was normal. Motor: normal bulk and tone, full strength in the BUE, 4/5 BLE,  Sensory: decreased to light touch, pinprick, and  Vibration, to about ankles Coordination: finger-nose-finger,  no dysmetria Reflexes: Symmetric upper and lower, plantar responses were flexor bilaterally. Gait and Station: in wheelchair today and not ambulated due to safety concerns DIAGNOSTIC DATA (LABS, IMAGING, TESTING) - I reviewed patient records, labs, notes, testing and imaging myself where available.  Lab Results  Component Value Date   WBC 10.5 02/03/2017   HGB 11.8 (L) 02/03/2017   HCT 37.0 02/03/2017   MCV 85.3 02/03/2017   PLT 334 02/03/2017      Component Value Date/Time   NA 135 06/16/2017 0933   K 4.1 07/07/2017 1014   CL 97 (L) 06/16/2017 0933   CO2 28 06/16/2017 0933   GLUCOSE 116 (H) 06/16/2017 0933   BUN 33 (H) 06/16/2017 0933   CREATININE 1.40 (H) 06/16/2017 0933   CALCIUM 9.1 06/16/2017 0933   PROT 8.4 (H) 01/13/2016 0758   ALBUMIN 3.0 (L) 01/13/2016 0758   AST 15 01/13/2016 0758   ALT 14 01/13/2016 0758   ALKPHOS 103 01/13/2016 0758   BILITOT 0.4 01/13/2016 0758   GFRNONAA 34 (L) 06/16/2017 0933   GFRAA 39 (L) 06/16/2017 0933     ASSESSMENT AND PLAN 84  years old Caucasian female  with complex partial seizure disorder, and  memory loss episodes of confusion. MRI of the brain has demonstrates diffuse atrophy, moderate small vessel disease,   also one tiny acute stroke involving corpus callosum, which would not explain her current symptoms, her confusion episode has much improved with Keppra 500 twice a day, EEG was normal,        PLAN: Continue Keppra 500mg  twice daily will refill Continue Celexa 20 daily just refilled for 1 year Use cane or walker at all times for safe ambulation , Recommend some in-home help physical therapy for strengthening exercises so the patient has better endurance with walking  F/U in 8 months I spent 25 min in total face to face time with the patient /grandson more than 50% of which was spent counseling and coordination of care, reviewing test results reviewing medications and discussing and reviewing the diagnosis of seizure disorder and memory loss and gait disorder and benefit of PT followed by HEP  Answered multiple questions  Katie Ford, GNP, [facility], APRN  [facility] [address], [address] [place], [place] [address] [phone] 

## 2017-10-21 ENCOUNTER — Other Ambulatory Visit: Payer: Self-pay | Admitting: *Deleted

## 2017-10-21 ENCOUNTER — Ambulatory Visit: Payer: Medicare Other | Admitting: Nurse Practitioner

## 2017-10-21 ENCOUNTER — Encounter: Payer: Self-pay | Admitting: Nurse Practitioner

## 2017-10-21 VITALS — BP 93/60 | HR 68 | Ht 61.0 in | Wt 129.0 lb

## 2017-10-21 DIAGNOSIS — R269 Unspecified abnormalities of gait and mobility: Secondary | ICD-10-CM

## 2017-10-21 DIAGNOSIS — M48061 Spinal stenosis, lumbar region without neurogenic claudication: Secondary | ICD-10-CM

## 2017-10-21 DIAGNOSIS — G40209 Localization-related (focal) (partial) symptomatic epilepsy and epileptic syndromes with complex partial seizures, not intractable, without status epilepticus: Secondary | ICD-10-CM | POA: Diagnosis not present

## 2017-10-21 MED ORDER — LEVETIRACETAM 500 MG PO TABS: 500.0000 mg | ORAL_TABLET | Freq: Two times a day (BID) | ORAL | 3 refills | Status: DC

## 2017-10-21 NOTE — Patient Instructions (Signed)
Continue Keppra 500mg  twice daily Use cane or walker at all times for safe ambulation , Recommend some in-home help physical therapy for strengthening exercises so the patient has better endurance with walking  F/U in 8 months

## 2017-10-22 NOTE — Progress Notes (Signed)
I have reviewed and agreed above plan. 

## 2018-01-12 ENCOUNTER — Other Ambulatory Visit: Payer: Self-pay | Admitting: Cardiovascular Disease

## 2018-02-11 ENCOUNTER — Other Ambulatory Visit: Payer: Self-pay | Admitting: Cardiovascular Disease

## 2018-04-01 ENCOUNTER — Other Ambulatory Visit: Payer: Self-pay | Admitting: *Deleted

## 2018-04-27 ENCOUNTER — Other Ambulatory Visit: Payer: Self-pay | Admitting: Cardiovascular Disease

## 2018-05-06 ENCOUNTER — Encounter: Payer: Self-pay | Admitting: Student

## 2018-05-06 ENCOUNTER — Ambulatory Visit: Payer: Medicare Other | Admitting: Student

## 2018-05-06 ENCOUNTER — Other Ambulatory Visit (HOSPITAL_COMMUNITY)
Admission: RE | Admit: 2018-05-06 | Discharge: 2018-05-06 | Disposition: A | Discharge status: Home / Self Care | Payer: Medicare Other | Source: Ambulatory Visit | Attending: Student | Admitting: Student

## 2018-05-06 VITALS — BP 124/72 | HR 58 | Ht 62.0 in | Wt 123.0 lb

## 2018-05-06 DIAGNOSIS — I2510 Atherosclerotic heart disease of native coronary artery without angina pectoris: Secondary | ICD-10-CM | POA: Diagnosis present

## 2018-05-06 DIAGNOSIS — I471 Supraventricular tachycardia, unspecified: Secondary | ICD-10-CM

## 2018-05-06 DIAGNOSIS — I5032 Chronic diastolic (congestive) heart failure: Secondary | ICD-10-CM

## 2018-05-06 DIAGNOSIS — Z79899 Other long term (current) drug therapy: Secondary | ICD-10-CM

## 2018-05-06 DIAGNOSIS — I10 Essential (primary) hypertension: Secondary | ICD-10-CM

## 2018-05-06 LAB — CBC WITH DIFFERENTIAL/PLATELET
ABS IMMATURE GRANULOCYTES: 0.06 10*3/uL (ref 0.00–0.07)
Basophils Absolute: 0 10*3/uL (ref 0.0–0.1)
Basophils Relative: 0 %
Eosinophils Absolute: 0 10*3/uL (ref 0.0–0.5)
Eosinophils Relative: 0 %
HCT: 40.5 % (ref 36.0–46.0)
HEMOGLOBIN: 12.4 g/dL (ref 12.0–15.0)
IMMATURE GRANULOCYTES: 1 %
Lymphocytes Relative: 13 %
Lymphs Abs: 1.2 10*3/uL (ref 0.7–4.0)
MCH: 28 pg (ref 26.0–34.0)
MCHC: 30.6 g/dL (ref 30.0–36.0)
MCV: 91.4 fL (ref 80.0–100.0)
MONO ABS: 0.4 10*3/uL (ref 0.1–1.0)
MONOS PCT: 4 %
NEUTROS ABS: 7.8 10*3/uL — AB (ref 1.7–7.7)
NEUTROS PCT: 82 %
Platelets: 335 10*3/uL (ref 150–400)
RBC: 4.43 MIL/uL (ref 3.87–5.11)
RDW: 13.7 % (ref 11.5–15.5)
WBC: 9.6 10*3/uL (ref 4.0–10.5)
nRBC: 0 % (ref 0.0–0.2)

## 2018-05-06 LAB — BASIC METABOLIC PANEL
ANION GAP: 5 (ref 5–15)
BUN: 30 mg/dL — AB (ref 8–23)
CHLORIDE: 102 mmol/L (ref 98–111)
CO2: 27 mmol/L (ref 22–32)
Calcium: 8.8 mg/dL — ABNORMAL LOW (ref 8.9–10.3)
Creatinine, Ser: 1.05 mg/dL — ABNORMAL HIGH (ref 0.44–1.00)
GFR calc Af Amer: 55 mL/min — ABNORMAL LOW (ref >60)
GFR calc non Af Amer: 47 mL/min — ABNORMAL LOW (ref >60)
Glucose, Bld: 143 mg/dL — ABNORMAL HIGH (ref 70–99)
POTASSIUM: 4.7 mmol/L (ref 3.5–5.1)
Sodium: 134 mmol/L — ABNORMAL LOW (ref 135–145)

## 2018-05-06 MED ORDER — POTASSIUM CHLORIDE CRYS ER 20 MEQ PO TBCR: 40.0000 meq | EXTENDED_RELEASE_TABLET | Freq: Every day | ORAL | 3 refills | Status: DC

## 2018-05-06 NOTE — Progress Notes (Signed)
Cardiology Office Note    Date:  05/06/2018   ID:  [name], [name] [birth year], MRN [id number]  PCP:  Dione Housekeeper, MD  Cardiologist: Kate Sable, MD    Chief Complaint  Patient presents with  . Follow-up    Annual Visit; No current concerns    History of Present Illness:    Katie Ford is a 84 y.o. female with past medical history of CAD (s/p BMS x2 to RCA in 2011 with medical management recommended of residual LAD disease), chronic diastolic CHF, HTN, HLD, and pSVT who presents to the office today annual follow-up.   She was last examined by Dr. Bronson Ing in 05/2017 and denied any recent chest pain or dyspnea on exertion at that time. She was allergic to Aspirin and has been intolerant of Plavix in the past, therefore she was continued on Effient. Was continued on her current medication regimen and informed to follow-up in 6 months but has not been evaluated since.  In talking with the patient and her family today, most history is provided by the daughter as the patient has experienced progressive dementia since her last office visit. She is alert and oriented x3 but unable to provide much history surrounding recent events. She now lives with family and typically ambulates with a cane or walker.  She denies any recent chest pain or dyspnea on exertion and her daughter reports she has not mentioned any recent symptoms. No recent orthopnea, PND, palpitations, dizziness, or presyncope. She does experience occasional lower extremity edema and says that Lasix was titrated by her PCP several months ago with improvement in her symptoms.  She has remained on Effient and denies any recent melena, hematochezia, or hematuria.   Past Medical History:  Diagnosis Date  . Asthmatic bronchitis   . CAD (coronary artery disease)    bare metal stents to 2 separate RCA lesions August, 2011, residual 60% LAD, medical therapy  /  plan was for effient for one month followed by  Plavix,, but patient does not tolerate Plavix... therefore patient placed back on effient  . Carotid artery disease ([place])   . Chronic back pain   . Chronic hip pain   . Dementia ([place])   . Dementia ([place])   . Diabetes mellitus    does not have  . Diverticulitis    remote hx  . Edema 03/29/2010   September, 2011  . Ejection fraction    EF 65 %... normal... catheterization 02/2010.  . Fracture of rib of left side 04/24/2011  . Gait abnormality   . GERD (gastroesophageal reflux disease)   . Hip fracture [facility])    October, 2012  . Hyperglycemia 04/24/2011  . Hypertension   . Hypothyroidism 04/24/2011  . Intolerance of drug    aspirin and plavix  . Mitral regurgitation    mild, echo, June, 2007  . Obesity   . Paroxysmal supraventricular tachycardia (HCC)    infrequent episodes over the years..palpitations.  . Sciatica     Past Surgical History:  Procedure Laterality Date  . ANKLE FRACTURE SURGERY  12/1993   [place]  . ARTHROPLASTY W/ ARTHROSCOPY MEDIAL / LATERAL COMPARTMENT KNEE     [facility], should read arthroscopy not arthroplasty  . CARDIAC CATHETERIZATION  02/22/2010   2 stents, [place]  . ESOPHAGOGASTRODUODENOSCOPY  12/06/11   fields: Negative H. pylori, hiatal hernia/moderate gastritis  . ileocolonscopy  12/06/11   fields:diverticula throughtout the colon/inertnal hemorrhoids/  . KNEE ARTHROSCOPY  11/2001   left knee  .  KNEE ARTHROSCOPY  02/19/2011   Procedure: ARTHROSCOPY KNEE;  Surgeon: Sanjuana Kava;  Location: [facility];  Service: Orthopedics;  Laterality: Right;  Medial and Lateral Partial Menisectomy  . LAPAROSCOPIC CHOLECYSTECTOMY  04/29/1996   [place] [facility]  . ORIF HIP FRACTURE  04/25/2011   Procedure: OPEN REDUCTION INTERNAL FIXATION HIP;  Surgeon: Sanjuana Kava;  Location: [facility];  Service: Orthopedics;  Laterality: Left;  . SP ARTHRO THUMB*R*  05/1989   [place]  . TOTAL ABDOMINAL HYSTERECTOMY  1983   [place]  . TUBAL LIGATION  09/1970    Current Medications: Outpatient Medications  Prior to Visit  Medication Sig Dispense Refill  . acetaminophen (TYLENOL) 500 MG tablet Take 1,000 mg by mouth every 6 (six) hours as needed for mild pain.    Marland Kitchen albuterol (PROVENTIL) (2.5 MG/3ML) 0.083% nebulizer solution Take 2.5 mg by nebulization daily as needed.    . ALPRAZolam (XANAX) 0.5 MG tablet Take 0.25-0.5 mg by mouth daily as needed. TAKE HALF TO ONE  A TABLET IF NEEDED FOR ANXIETY    . amLODipine (NORVASC) 10 MG tablet Take 10 mg by mouth every morning.     Marland Kitchen BREO ELLIPTA 100-25 MCG/INH AEPB Inhale 1 puff into the lungs daily.    . cetirizine (ZYRTEC) 10 MG tablet Take 5 mg by mouth daily.     . citalopram (CELEXA) 10 MG tablet Take 2 tablets (20 mg total) by mouth daily. 60 tablet 11  . esomeprazole (NEXIUM) 40 MG capsule TAKE 1 CAPSULE (40 MG TOTAL)  BY MOUTH DAILY BEFORE BREAKFAST. 30 capsule 5  . fluticasone (FLONASE) 50 MCG/ACT nasal spray Place 1 spray into both nostrils daily as needed for allergies.     . fluticasone (FLOVENT HFA) 44 MCG/ACT inhaler Inhale 1 puff into the lungs 2 (two) times daily as needed (Shortness of Breath).     . furosemide (LASIX) 40 MG tablet TAKE 1 TABLET DAILY 90 tablet 3  . levETIRAcetam (KEPPRA) 500 MG tablet Take 1 tablet (500 mg total) by mouth 2 (two) times daily. 180 tablet 3  . losartan-hydrochlorothiazide (HYZAAR) 50-12.5 MG tablet TAKE 1 TABLET ONCE A DAY    . metoprolol succinate (TOPROL-XL) 100 MG 24 hr tablet Take 100 mg by mouth 2 (two) times daily. Take with or immediately following a meal.     . mometasone (NASONEX) 50 MCG/ACT nasal spray Place 2 sprays into the nose daily as needed (Congestion).     . nitroGLYCERIN (NITROSTAT) 0.4 MG SL tablet Place 1 tablet (0.4 mg total) under the tongue every 5 (five) minutes as needed for chest pain. 25 tablet 3  . polyethylene glycol (MIRALAX / GLYCOLAX) packet Take 17 g by mouth daily as needed for mild constipation.     . Polyvinyl Alcohol-Povidone (MURINE TEARS FOR DRY EYES) 5-6 MG/ML SOLN  Place 1 drop into both eyes every morning. As needed    . prasugrel (EFFIENT) 5 MG TABS tablet Take 1 tablet (5 mg total) by mouth daily. 30 tablet 6  . traMADol (ULTRAM) 50 MG tablet Take 1 tablet (50 mg total) by mouth every 12 (twelve) hours as needed. for pain 60 tablet 0  . potassium chloride SA (K-DUR,KLOR-CON) 20 MEQ tablet TAKE (2) TABLETS DAILY. NEEDS OFFICE VIST 60 tablet 3   No facility-administered medications prior to visit.      Allergies:   Aricept [donepezil hcl]; Celecoxib; Codeine; Hydrocodone; Tartrazine; and Aspirin   Social History   Socioeconomic History  . Marital status: Widowed  Spouse name: Not on file  . Number of children: 6  . Years of education: college  . Highest education level: Not on file  Occupational History  . 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 Financial resource strain: Not on file  . Food insecurity:    Worry: Not on file    Inability: Not on file  . Transportation needs:    Medical: Not on file    Non-medical: Not on file  Tobacco Use  . Smoking status: Never Smoker  . Smokeless tobacco: Never Used  Substance and Sexual Activity  . Alcohol use: No    Alcohol/week: 1.0 standard drinks    Types: 1 Glasses of wine per week    Comment: rarely  . Drug use: No  . Sexual activity: Never  Lifestyle  . Physical activity:    Days per week: Not on file    Minutes per session: Not on file  . Stress: Not on file  Relationships  . Social connections:    Talks on phone: Not on file    Gets together: Not on file    Attends religious service: Not on file    Active member of club or organization: Not on file    Attends meetings of clubs or organizations: Not on file    Relationship status: Not on file  Other Topics Concern  . Not on file  Social History Narrative   .Retired Education officer, museum.    Patient has a Best boy.   Right handed.   Caffeine. None   Patient lives at home with her sister Bartolo Darter)             Family History:  The patient's family history includes Allergies in her brother; Arthritis in her brother, mother, and sister; Cancer in her sister; Cataracts in her father; Diabetes in her mother; Emphysema in her father; Heart attack in her father; Heart disease in her sister; Stroke in her father.   Review of Systems:   Please see the history of present illness.     General:  No chills, fever, night sweats or weight changes.  Cardiovascular:  No chest pain, dyspnea on exertion, orthopnea, palpitations, paroxysmal nocturnal dyspnea. Positive for edema (improved).  Dermatological: No rash, lesions/masses Respiratory: No cough, dyspnea Urologic: No hematuria, dysuria Abdominal:   No nausea, vomiting, diarrhea, bright red blood per rectum, melena, or hematemesis Neurologic:  No visual changes or wkns. Positive for changes in mental status.  All other systems reviewed and are otherwise negative except as noted above.   Physical Exam:    VS:  BP 124/72   Pulse (!) 58   Ht 5\' 2"  (1.575 m)   Wt 123 lb (55.8 kg)   SpO2 96%   BMI 22.50 kg/m    General: Well developed, elderly Caucasian female appearing in no acute distress. Head: Normocephalic, atraumatic, sclera non-icteric, no xanthomas, nares are without discharge.  Neck: No carotid bruits. JVD not elevated.  Lungs: Respirations regular and unlabored, without wheezes or rales.  Heart: Regular rate and rhythm with occasional ectopic beats. No S3 or S4.  No murmur, no rubs, or gallops appreciated. Abdomen: Soft, non-tender, non-distended with normoactive bowel sounds. No hepatomegaly. No rebound/guarding. No obvious abdominal masses. Msk:  Strength and tone appear normal for age. No joint deformities or effusions. Extremities: No clubbing or cyanosis. Trace lower extremity edema.  Distal pedal pulses are 2+ bilaterally. Neuro: Alert and oriented X 3. Moves all extremities  spontaneously. No focal deficits noted. Psych:   Responds to questions appropriately with a normal affect. Skin: No rashes or lesions noted  Wt Readings from Last 3 Encounters:  05/06/18 123 lb (55.8 kg)  10/21/17 129 lb (58.5 kg)  06/19/17 145 lb (65.8 kg)     Studies/Labs Reviewed:   EKG:  EKG is ordered today. The ekg ordered today demonstrates normal sinus rhythm, heart rate 63, with PAC's.No acute ST changes when compared to prior tracings.  Recent Labs: 05/06/2018: BUN 30; Creatinine, Ser 1.05; Hemoglobin 12.4; Platelets 335; Potassium 4.7; Sodium 134   Lipid Panel    Component Value Date/Time   CHOL  02/23/2010 0524    132        ATP III CLASSIFICATION:  <200     mg/dL   Desirable  200-239  mg/dL   Borderline High  >=240    mg/dL   High          TRIG 184 (H) 02/23/2010 0524   HDL 33 (L) 02/23/2010 0524   CHOLHDL 4.0 02/23/2010 0524   VLDL 37 02/23/2010 0524   LDLCALC  02/23/2010 0524    62        Total Cholesterol/HDL:CHD Risk Coronary Heart Disease Risk Table                     Men   Women  1/2 Average Risk   3.4   3.3  Average Risk       5.0   4.4  2 X Average Risk   9.6   7.1  3 X Average Risk  23.4   11.0        Use the calculated Patient Ratio above and the CHD Risk Table to determine the patient's CHD Risk.        ATP III CLASSIFICATION (LDL):  <100     mg/dL   Optimal  100-129  mg/dL   Near or Above                    Optimal  130-159  mg/dL   Borderline  160-189  mg/dL   High  >190     mg/dL   Very High    Additional studies/ records that were reviewed today include:   Echocardiogram: 01/2017 Study Conclusions  - Left ventricle: The cavity size was normal. There was focal basal   hypertrophy. Systolic function was normal. The estimated ejection   fraction was in the range of 60% to 65%. Wall motion was normal;   there were no regional wall motion abnormalities. Features are   consistent with a pseudonormal left ventricular filling pattern,   with concomitant abnormal relaxation and  increased filling   pressure (grade 2 diastolic dysfunction). Doppler parameters are   consistent with high ventricular filling pressure. - Aortic valve: Moderately to severely calcified annulus.   Trileaflet; moderately thickened leaflets. Valve area (VTI): 2.13   cm^2. Valve area (Vmax): 2.01 cm^2. - Mitral valve: Moderately to severely calcified annulus. Mildly   thickened leaflets . The findings are consistent with mild   stenosis. There was mild regurgitation. Mean gradient (D): 3 mm   Hg. Valve area by pressure half-time: 2.22 cm^2. - Left atrium: The atrium was moderately dilated. - Right atrium: The atrium was mildly dilated. - Atrial septum: No defect or patent foramen ovale was identified. - Pulmonary arteries: Systolic pressure was moderately increased.   PA peak pressure: 42 mm Hg (S). - Technically adequate  study.  Assessment:    1. Coronary artery disease involving native coronary artery of native heart without angina pectoris   2. Chronic diastolic CHF (congestive heart failure) (HCC)   3. Paroxysmal supraventricular tachycardia ([place])   4. Essential hypertension   5. Medication management      Plan:   In order of problems listed above:  1. CAD  - she is s/p BMS x2 to RCA in 2011 with medical management recommended of residual LAD disease.  History is limited secondary to her dementia but she denies any recent chest pain or dyspnea on exertion.  Would continue with medical management of her CAD at this time given her dementia and no recent anginal symptoms. - Remains on Effient (allergic to ASA and intolerant to Plavix) and BB therapy. Will recheck CBC to assess Hgb and platelet count. Statin therapy previously discontinued by her PCP per family's report.   2. Chronic Diastolic CHF - She denies any recent orthopnea or PND and reports improvement in her lower extremity edema since being started on Lasix. She is currently taking both Lasix 40 mg daily and Hyzaar  50-12.5mg  daily. We typically prefer to avoid dual diuretic therapy but her daughter mentions she had swelling off of HCTZ in the past. Will obtain a repeat BMET today to assess kidney function and K+ levels.  If creatinine is elevated, would plan to discontinue HCTZ.    3. Paroxysmal SVT - She denies any recent palpitations and is in normal sinus rhythm today with EKG showing occasional PAC's.  - Continue Toprol-XL 100 mg twice daily.  4. HTN - BP is well controlled at 124/72 during today's visit. - Continue Amlodipine 10 mg daily, Hyzaar 50-12.5mg  daily, and Toprol-XL 100 mg twice daily.  5. Dementia - previously started on Aricept by her PCP but was unable to tolerate. She is now living with family who assist with ADL's and manage her medications.    Medication Adjustments/Labs and Tests Ordered: Current medicines are reviewed at length with the patient today.  Concerns regarding medicines are outlined above.  Medication changes, Labs and Tests ordered today are listed in the Patient Instructions below. Patient Instructions  Medication Instructions:  Your physician recommends that you continue on your current medications as directed. Please refer to the Current Medication list given to you today.  If you need a refill on your cardiac medications before your next appointment, please call your pharmacy.   Lab work: Your physician recommends that you return for lab work in: Today   If you have labs (blood work) drawn today and your tests are completely normal, you will receive your results only by: Marland Kitchen MyChart Message (if you have MyChart) OR . A paper copy in the mail If you have any lab test that is abnormal or we need to change your treatment, we will call you to review the results.  Testing/Procedures: NONE   Follow-Up: At [facility], you and your health needs are our priority.  As part of our continuing mission to provide you with exceptional heart care, we have created  designated Provider Care Teams.  These Care Teams include your primary Cardiologist (physician) and Advanced Practice Providers (APPs -  Physician Assistants and Nurse Practitioners) who all work together to provide you with the care you need, when you need it. You will need a follow up appointment in 1 years.  Please call our office 2 months in advance to schedule this appointment.  You may see Kate Sable, MD  or one of the following Advanced Practice Providers on your designated Care Team:   [place], PA-C [facility]) . Ermalinda Barrios, PA-C ([place])  Any Other Special Instructions Will Be Listed Below (If Applicable). Thank you for choosing [facility]!     Signed, Erma Heritage, PA-C  05/06/2018 5:05 PM    [place] [address] [place], [place] [address] Phone: [phone]

## 2018-05-06 NOTE — Patient Instructions (Signed)
Medication Instructions:  Your physician recommends that you continue on your current medications as directed. Please refer to the Current Medication list given to you today.  If you need a refill on your cardiac medications before your next appointment, please call your pharmacy.   Lab work: Your physician recommends that you return for lab work in: Today   If you have labs (blood work) drawn today and your tests are completely normal, you will receive your results only by: Marland Kitchen MyChart Message (if you have MyChart) OR . A paper copy in the mail If you have any lab test that is abnormal or we need to change your treatment, we will call you to review the results.  Testing/Procedures: NONE   Follow-Up: At [facility], you and your health needs are our priority.  As part of our continuing mission to provide you with exceptional heart care, we have created designated Provider Care Teams.  These Care Teams include your primary Cardiologist (physician) and Advanced Practice Providers (APPs -  Physician Assistants and Nurse Practitioners) who all work together to provide you with the care you need, when you need it. You will need a follow up appointment in 1 years.  Please call our office 2 months in advance to schedule this appointment.  You may see Kate Sable, MD or one of the following Advanced Practice Providers on your designated Care Team:   Bernerd Pho, PA-C [facility]) . Ermalinda Barrios, PA-C ([place])  Any Other Special Instructions Will Be Listed Below (If Applicable). Thank you for choosing [facility]!

## 2018-06-22 ENCOUNTER — Ambulatory Visit: Payer: Medicare Other | Admitting: Nurse Practitioner

## 2018-07-10 ENCOUNTER — Other Ambulatory Visit: Payer: Self-pay | Admitting: Cardiovascular Disease

## 2018-09-08 ENCOUNTER — Emergency Department (HOSPITAL_COMMUNITY)
Admission: EM | Admit: 2018-09-08 | Discharge: 2018-09-08 | Disposition: A | Discharge status: Home / Self Care | Payer: Medicare Other | Attending: Emergency Medicine | Admitting: Emergency Medicine

## 2018-09-08 ENCOUNTER — Encounter (HOSPITAL_COMMUNITY): Payer: Self-pay

## 2018-09-08 ENCOUNTER — Emergency Department (HOSPITAL_COMMUNITY): Payer: Medicare Other

## 2018-09-08 ENCOUNTER — Other Ambulatory Visit: Payer: Self-pay

## 2018-09-08 DIAGNOSIS — E039 Hypothyroidism, unspecified: Secondary | ICD-10-CM | POA: Diagnosis not present

## 2018-09-08 DIAGNOSIS — Z79899 Other long term (current) drug therapy: Secondary | ICD-10-CM | POA: Insufficient documentation

## 2018-09-08 DIAGNOSIS — W06XXXA Fall from bed, initial encounter: Secondary | ICD-10-CM | POA: Insufficient documentation

## 2018-09-08 DIAGNOSIS — W19XXXA Unspecified fall, initial encounter: Secondary | ICD-10-CM

## 2018-09-08 DIAGNOSIS — Y929 Unspecified place or not applicable: Secondary | ICD-10-CM | POA: Diagnosis not present

## 2018-09-08 DIAGNOSIS — I2510 Atherosclerotic heart disease of native coronary artery without angina pectoris: Secondary | ICD-10-CM | POA: Insufficient documentation

## 2018-09-08 DIAGNOSIS — Y939 Activity, unspecified: Secondary | ICD-10-CM | POA: Insufficient documentation

## 2018-09-08 DIAGNOSIS — I10 Essential (primary) hypertension: Secondary | ICD-10-CM | POA: Diagnosis not present

## 2018-09-08 DIAGNOSIS — S20302A Unspecified superficial injuries of left front wall of thorax, initial encounter: Secondary | ICD-10-CM | POA: Insufficient documentation

## 2018-09-08 DIAGNOSIS — J45909 Unspecified asthma, uncomplicated: Secondary | ICD-10-CM | POA: Insufficient documentation

## 2018-09-08 DIAGNOSIS — Y999 Unspecified external cause status: Secondary | ICD-10-CM | POA: Insufficient documentation

## 2018-09-08 DIAGNOSIS — E119 Type 2 diabetes mellitus without complications: Secondary | ICD-10-CM | POA: Insufficient documentation

## 2018-09-08 DIAGNOSIS — F0390 Unspecified dementia without behavioral disturbance: Secondary | ICD-10-CM | POA: Insufficient documentation

## 2018-09-08 NOTE — ED Provider Notes (Signed)
[facility] EMERGENCY DEPARTMENT Provider Note   CSN: [id number] Arrival date & time: 09/08/18  1705    History   Chief Complaint Chief Complaint  Patient presents with  . Fall    HPI Katie Ford is a 85 y.o. female.     Pt presents to the ED today with fall.  The pt has dementia and lives with family.  Sunday (2/16), pt slid out of bed.  She did not seem to be injured, so they got her back in bed.  Today, she started c/o left rib pain, so they wanted to make sure she did not break anything.  Family did give her a tramadol pta and she denies pain now.  Family said they have ordered bedrails which are arriving tomorrow.  The family said she's been acting normally before and after fall.  They don't think she hit anything.  She did not have a loc.     Past Medical History:  Diagnosis Date  . Asthmatic bronchitis   . CAD (coronary artery disease)    bare metal stents to 2 separate RCA lesions August, 2011, residual 60% LAD, medical therapy  /  plan was for effient for one month followed by Plavix,, but patient does not tolerate Plavix... therefore patient placed back on effient  . Carotid artery disease ([place])   . Chronic back pain   . Chronic hip pain   . Dementia ([place])   . Dementia ([place])   . Diabetes mellitus    does not have  . Diverticulitis    remote hx  . Edema 03/29/2010   September, 2011  . Ejection fraction    EF 65 %... normal... catheterization 02/2010.  . Fracture of rib of left side 04/24/2011  . Gait abnormality   . GERD (gastroesophageal reflux disease)   . Hip fracture [facility])    October, 2012  . Hyperglycemia 04/24/2011  . Hypertension   . Hypothyroidism 04/24/2011  . Intolerance of drug    aspirin and plavix  . Mitral regurgitation    mild, echo, June, 2007  . Obesity   . Paroxysmal supraventricular tachycardia (HCC)    infrequent episodes over the years..palpitations.  . Sciatica     Patient Active Problem List   Diagnosis Date Noted  .  Abnormality of gait 10/21/2017  . Dementia ([place]) 03/11/2017  . Dementia with behavioral problem ([place]) 09/11/2016  . Lumbar stenosis 09/11/2015  . Complex partial seizure ([place]) 09/02/2013  . Subacute confusional state 07/30/2013  . Carotid artery disease ([place])   . Diverticulosis 03/06/2012  . Abnormal CT of the abdomen 11/25/2011  . Abdominal pain, unspecified site 11/25/2011  . Ejection fraction   . Intolerance of drug   . Hip fracture ([place])   . UTI (urinary tract infection) 04/25/2011  . Leukocytosis 04/24/2011  . Hyponatremia 04/24/2011  . Fracture of left hip ([place]) 04/24/2011  . Asthmatic bronchitis 04/24/2011  . Anemia 04/24/2011  . Hypothyroidism 04/24/2011  . DM2 (diabetes mellitus, type 2) ([place]) 04/24/2011  . Fracture of rib of left side 04/24/2011  . Mitral regurgitation   . Paroxysmal supraventricular tachycardia ([place])   . Hypertension   . Asthmatic bronchitis   . Obesity   . GERD (gastroesophageal reflux disease)   . CAD (coronary artery disease)   . Edema 03/29/2010    Past Surgical History:  Procedure Laterality Date  . ANKLE FRACTURE SURGERY  12/1993   [place]  . ARTHROPLASTY W/ ARTHROSCOPY MEDIAL / LATERAL COMPARTMENT KNEE  [facility], should read arthroscopy not arthroplasty  . CARDIAC CATHETERIZATION  02/22/2010   2 stents, [place]  . ESOPHAGOGASTRODUODENOSCOPY  12/06/11   fields: Negative H. pylori, hiatal hernia/moderate gastritis  . ileocolonscopy  12/06/11   fields:diverticula throughtout the colon/inertnal hemorrhoids/  . KNEE ARTHROSCOPY  11/2001   left knee  . KNEE ARTHROSCOPY  02/19/2011   Procedure: ARTHROSCOPY KNEE;  Surgeon: Sanjuana Kava;  Location: [facility];  Service: Orthopedics;  Laterality: Right;  Medial and Lateral Partial Menisectomy  . LAPAROSCOPIC CHOLECYSTECTOMY  04/29/1996   [place] [facility]  . ORIF HIP FRACTURE  04/25/2011   Procedure: OPEN REDUCTION INTERNAL FIXATION HIP;  Surgeon: Sanjuana Kava;  Location: [facility];  Service: Orthopedics;  Laterality:  Left;  . SP ARTHRO THUMB*R*  05/1989   [place]  . TOTAL ABDOMINAL HYSTERECTOMY  1983   [place]  . TUBAL LIGATION  09/1970     OB History    Gravida  6   Para  6   Term  6   Preterm      AB      Living        SAB      TAB      Ectopic      Multiple      Live Births               Home Medications    Prior to Admission medications   Medication Sig Start Date End Date Taking? Authorizing Provider  albuterol (PROVENTIL) (2.5 MG/3ML) 0.083% nebulizer solution Take 2.5 mg by nebulization daily as needed for wheezing or shortness of breath.  12/12/15  Yes [provider]  ALPRAZolam (XANAX) 0.5 MG tablet Take 0.25-0.5 mg by mouth daily as needed for anxiety or sleep. TAKE HALF TO ONE  A TABLET IF NEEDED FOR ANXIETY   Yes [provider]  alum & mag hydroxide-simeth (MYLANTA) 245-809-98 MG/5ML suspension Take 15 mLs by mouth every 6 (six) hours as needed for indigestion or heartburn.   Yes [provider]  amLODipine (NORVASC) 10 MG tablet Take 10 mg by mouth every morning.    Yes [provider]  BREO ELLIPTA 100-25 MCG/INH AEPB Inhale 1 puff into the lungs daily. 12/04/15  Yes [provider]  cetirizine (ZYRTEC) 10 MG tablet Take 5 mg by mouth daily.    Yes [provider]  citalopram (CELEXA) 10 MG tablet Take 2 tablets (20 mg total) by mouth daily. Patient taking differently: Take 20 mg by mouth at bedtime.  10/20/17  Yes Marcial Pacas, MD  esomeprazole (NEXIUM) 40 MG capsule TAKE 1 CAPSULE (40 MG TOTAL)  BY MOUTH DAILY BEFORE BREAKFAST. Patient taking differently: Take 40 mg by mouth every morning.  01/24/14  Yes Fields, Sandi L, MD  fluticasone (FLONASE) 50 MCG/ACT nasal spray Place 1 spray into both nostrils daily as needed for allergies.  06/25/13  Yes [provider]  furosemide (LASIX) 40 MG tablet TAKE 1 TABLET DAILY Patient taking differently: Take 40 mg by mouth daily.  07/10/18  Yes Herminio Commons, MD    levETIRAcetam (KEPPRA) 500 MG tablet Take 1 tablet (500 mg total) by mouth 2 (two) times daily. 10/21/17  Yes Dennie Bible, NP  levothyroxine (SYNTHROID, LEVOTHROID) 88 MCG tablet Take 88 mcg by mouth every morning. 08/08/18  Yes [provider]  losartan-hydrochlorothiazide (HYZAAR) 50-12.5 MG tablet Take 1 tablet by mouth daily.  01/20/17  Yes [provider]  metoprolol succinate (TOPROL-XL) 100 MG 24 hr  tablet Take 100 mg by mouth 2 (two) times daily. Take with or immediately following a meal.    Yes [provider]  nitroGLYCERIN (NITROSTAT) 0.4 MG SL tablet Place 1 tablet (0.4 mg total) under the tongue every 5 (five) minutes as needed for chest pain. 12/26/15  Yes Herminio Commons, MD  polyethylene glycol (MIRALAX / GLYCOLAX) packet Take 17 g by mouth daily as needed for mild constipation.    Yes [provider]  Polyvinyl Alcohol-Povidone (MURINE TEARS FOR DRY EYES) 5-6 MG/ML SOLN Place 1 drop into both eyes every morning. As needed   Yes [provider]  potassium chloride SA (K-DUR,KLOR-CON) 20 MEQ tablet Take 2 tablets (40 mEq total) by mouth daily. 05/06/18  Yes Strader, [place] M, PA-C  prasugrel (EFFIENT) 5 MG TABS tablet Take 1 tablet (5 mg total) by mouth daily. 03/09/15  Yes Carlena Bjornstad, MD  traMADol (ULTRAM) 50 MG tablet Take 1 tablet (50 mg total) by mouth every 12 (twelve) hours as needed. for pain 04/28/16  Yes Sanjuana Kava, MD    Family History Family History  Problem Relation Age of Onset  . Diabetes Mother   . Arthritis Mother   . Heart attack Father   . Stroke Father   . Emphysema Father   . Cataracts Father   . Cancer Sister   . Heart disease Sister   . Arthritis Sister   . Arthritis Brother   . Allergies Brother   . Colon cancer Neg Hx     Social History Social History   Tobacco Use  . Smoking status: Never Smoker  . Smokeless tobacco: Never Used  Substance Use Topics  . Alcohol use: No     Alcohol/week: 1.0 standard drinks    Types: 1 Glasses of wine per week    Comment: rarely  . Drug use: No     Allergies   Aricept [donepezil hcl]; Celecoxib; Codeine; Hydrocodone; Tartrazine; and Aspirin   Review of Systems Review of Systems  Unable to perform ROS: Dementia     Physical Exam Updated Vital Signs BP (!) 81/51 Comment: Per pt's family, this is normal for her  Pulse 72   Temp 98.2 F (36.8 C) (Oral)   Resp 15   Wt 65.8 kg   SpO2 97%   BMI 26.52 kg/m   Physical Exam Vitals signs and nursing note reviewed.  Constitutional:      Appearance: Normal appearance. She is underweight.  HENT:     Head: Normocephalic and atraumatic.     Right Ear: External ear normal.     Left Ear: External ear normal.     Nose: Nose normal.     Mouth/Throat:     Mouth: Mucous membranes are moist.  Eyes:     Extraocular Movements: Extraocular movements intact.     Pupils: Pupils are equal, round, and reactive to light.  Neck:     Musculoskeletal: Normal range of motion and neck supple.  Cardiovascular:     Rate and Rhythm: Normal rate and regular rhythm.     Pulses: Normal pulses.     Heart sounds: Normal heart sounds.  Pulmonary:     Effort: Pulmonary effort is normal.     Breath sounds: Normal breath sounds.  Abdominal:     General: Abdomen is flat. Bowel sounds are normal.     Palpations: Abdomen is soft.  Musculoskeletal: Normal range of motion.  Skin:    General: Skin is warm and dry.  Capillary Refill: Capillary refill takes less than 2 seconds.  Neurological:     General: No focal deficit present.     Mental Status: She is alert and oriented to person, place, and time.  Psychiatric:        Mood and Affect: Mood normal.        Behavior: Behavior normal.      ED Treatments / Results  Labs (all labs ordered are listed, but only abnormal results are displayed) Labs Reviewed - No data to display  EKG None  Radiology Dg Chest 2 View  Result Date:  09/08/2018 CLINICAL DATA:  85 y/o F; fall with left shoulder, left arm, and left hip pain. EXAM: CHEST - 2 VIEW COMPARISON:  02/03/2017 chest radiograph FINDINGS: Stable cardiac silhouette given projection and technique. Aortic atherosclerosis with calcification. Clear lungs. No pleural effusion or pneumothorax. No acute osseous abnormality is evident. IMPRESSION: No acute process identified.  Aortic Atherosclerosis (ICD10-I70.0). Electronically Signed   By: Kristine Garbe M.D.   On: 09/08/2018 19:12   Dg Pelvis 1-2 Views  Result Date: 09/08/2018 CLINICAL DATA:  Fall EXAM: PELVIS - 1-2 VIEW COMPARISON:  07/13/2013 FINDINGS: Chronic fracture left femur with surgical fixation. Negative for acute fracture in the pelvis. Extensive degenerative change in the lumbar spine. Atherosclerotic calcification. IMPRESSION: Negative for acute pelvic fracture. Electronically Signed   By: Franchot Gallo M.D.   On: 09/08/2018 19:12    Procedures Procedures (including critical care time)  Medications Ordered in ED Medications - No data to display   Initial Impression / Assessment and Plan / ED Course  I have reviewed the triage vital signs and the nursing notes.  Pertinent labs & imaging results that were available during my care of the patient were reviewed by me and considered in my medical decision making (see chart for details).    Pt has no obvious fracture.  She is stable for d/c.  Return if worse.  Final Clinical Impressions(s) / ED Diagnoses   Final diagnoses:  Fall, initial encounter    ED Discharge Orders    None       Isla Pence, MD 09/08/18 1921

## 2018-09-08 NOTE — ED Triage Notes (Signed)
Pt rolled out of bed on Sunday. Had no complaints until today. Has left shoulder, arm, and hip pain. No deformities and no shortening. Non ambulatory.

## 2018-09-10 NOTE — Progress Notes (Deleted)
[facility]  PATIENT: Katie Ford DOB: 27-Mar-1932   REASON FOR VISIT: Follow-up for seizure disorder and memory loss  HISTORY FROM: patient and grandson [name]    HISTORY OF PRESENT ILLNESS:[name] is a 85  years old right-handed Caucasian female, accompanied by her sister, four daughters, referred by emergency room, and her primary care physician Dr. Deirdre Pippins for evaluation of recent onset confusion spells  She had a past medical history of anxiety, mitral valve regurgitation, coronary artery disease,cardiologist is Dr. Ron Parker, she is on effient, digoxin treatment.  She is a retired Careers information officer, remained to be physically active, including swimming regularly, she did has mild gait difficulty after her left hip fracture, and fixation surgery in  2012, she lives with her sister, prior her most recent hospital presentation in July 11 2013,she is very active, balance her checkbook,drive long distance without difficulty.  In July 11 2013, she was noticed to have difficulty sorted out her medications, got very frustrated, she complains of not feeling well, went to bed early that night, woke up around 1 AM in the morning, confused, she went to her sister's bedroom, on asking her where she is, later she went to bed, was noted by her family December 21st increased confusion, did not know where she is, she was taken to hospital, CAT scan of the brain showed previous old stroke in the left basal ganglion, chronic sinusitis, small vessel disease, no acute stroke,  Ultrasound of carotid arteries showed less than 50% stenosis of bilateral internal carotid artery  Bilateral lower extremity venous Doppler showed no DVT in bilateral lower extremity,  Bilateral hip X-ray demonstrated previous left hip surgery, no evidence of acute process,  She was treated with antibiotics Keflex for sinusitis, over the past 2 weeks, she has improved, but not back to her baseline,  she continued to have intermittent episodes of confusion, eye blinking spells, lasting for a few minutes, followed by extreme fatigue, tends to sleep afterwards  I have reviewed MRI together with patient and her family, MRI of the brain has demonstrated positive diffusion imaging shows a 5 mm focus of acute infarction in the midline body of the corpus callosum. No other acute infarction.  There chronic small-vessel changes affecting the pons.  There are moderate changes of chronic small vessel disease affecting the deep and subcortical cerebral hemispheric white matter. There was diffuse atrophy  She denies a previous history of seizure  UPDATE Sep 02, 2013:YY MRA of the brain was normal.    She fell, was taken to the emergency room, had MRI of pelvic showed no acute findings related to recent fall. Moderate right gluteus minimus tendinosis with adjacent bursal fluid. No tendon tear demonstrated. Left pelvic muscular atrophy related to remote left proximal femoral ORIF.  Lower lumbar spondylosis with chronic resulting foraminal  Stenosis.  MRI cervical in December 2014 Spondylosis at C5-6 contributes to mild central and moderate biforaminal stenosis.  Sometimes, urinary urgency since 2012, no change.   She was started on Keppra, 500mg  bid, she feels more alert, is also confirmed by her sister,  no more confusion episodes, no more spells  UPDATE Sep 02 2014:YY She is now taking Keppra 500mg  bid, no more seizure like event, no side effect, still swimming.  She is driving some, word finding difficulty, short memory trouble, she lives with her sister,   UPDATE Sep 11 2015:YY She still swim, her low lumbar spine compression fracture  I have personally reviewed MRI lumbar in  Dec 2016: Severe degenerative lumbar spondylosis with multilevel disc disease and facet disease, severe multifactorial spinal stenosis and bilateral recess stenosis at L3-4, L4-5, broad-based left foraminal  extraforaminal disc protrusion at L4-5 with significant left foraminal stenosis, Stable lower thoracic degenerative disc disease. Persistent spinal stenosis and mild malacia involving the lower thoracic cord at T11-12.  She has significant gait difficulty, low back pain, no recurrent seizure, tolerating Keppra 500 mg twice a day, continue has mild memory trouble  UPDATE Sep 11 2016:YY She is accompanied by her daughter and grandson at today's clinical visit, she had no recurrent seizure, but she had slight worsening memory loss, also becoming easily agitated, throw hot pan at her family members, chronic low back pain, hip pain, mild gait abnormality. UPDATE 08/21/2018CM Ms. Manual, 85 year old female returns for follow-up with history of seizure disorder and memory loss. Her memory has continued to decline. She has not had further seizure activity. She was placed on Seroquel and Remeron at her last visit with Dr. Krista Blue. Those medications were not started. She had a daughter die recently at the age of 36 unexpectedly. She continues to have low back pain and gait abnormality.No recent falls. Ambulates with a quad cane. Family members assist patient in her home.  UPDATE 4/2/2019CM Ms Towell, 85 year old female returns for follow-up with history of seizure disorder and memory loss.  Her memory has continued to decline at last visit and she was placed on Aricept but her son called in and stated she was having diarrhea on the drug so it was stopped.  She has been tried on Seroquel and Remeron in the past by Dr. Krista Blue but never started the medication she has significant back pain and gait abnormality.  She is seated in a wheelchair today with stooped posture.  She is with her grandson [name] with whom she lives.  Appetite is good and she denies any difficulty sleeping at night she has had several falls in the last 6 months she has not had any injury.  She continues to ambulate with a quad cane or rolling walker.   Anytime she tries to get up without assistive device and that is why she falls.  She returns for reevaluation REVIEW OF SYSTEMS: Full 14 system review of systems performed and notable only for those listed, all others are neg:  Constitutional: Fatigue   Cardiovascular: neg Ear/Nose/Throat: neg  Skin: neg Eyes: Blurred vision Respiratory: Chronic cough Gastroitestinal: neg  Hematology/Lymphatic: neg  Endocrine: neg Musculoskeletal:chronic back pain, gait disorder Allergy/Immunology: neg Neurological: memory loss, seizure disorder Psychiatric: neg Sleep : neg   ALLERGIES: Allergies  Allergen Reactions  . Aricept [Donepezil Hcl]     All side effects  . Celecoxib Other (See Comments)    TACHYCARDIA  . Codeine Other (See Comments)    TACHYCARIDIA  . Hydrocodone Other (See Comments)    TACHYCARDIA  . Tartrazine Other (See Comments)    Causes Tachycardia  . Aspirin     ONLY TARTRAZINE!!! Causes Tachycardia    HOME MEDICATIONS: Outpatient Medications Prior to Visit  Medication Sig Dispense Refill  . albuterol (PROVENTIL) (2.5 MG/3ML) 0.083% nebulizer solution Take 2.5 mg by nebulization daily as needed for wheezing or shortness of breath.     . ALPRAZolam (XANAX) 0.5 MG tablet Take 0.25-0.5 mg by mouth daily as needed for anxiety or sleep. TAKE HALF TO ONE  A TABLET IF NEEDED FOR ANXIETY    . alum & mag hydroxide-simeth (MYLANTA) 200-200-20 MG/5ML suspension Take 15 mLs by  mouth every 6 (six) hours as needed for indigestion or heartburn.    Marland Kitchen amLODipine (NORVASC) 10 MG tablet Take 10 mg by mouth every morning.     Marland Kitchen BREO ELLIPTA 100-25 MCG/INH AEPB Inhale 1 puff into the lungs daily.    . cetirizine (ZYRTEC) 10 MG tablet Take 5 mg by mouth daily.     . citalopram (CELEXA) 10 MG tablet Take 2 tablets (20 mg total) by mouth daily. (Patient taking differently: Take 20 mg by mouth at bedtime. ) 60 tablet 11  . esomeprazole (NEXIUM) 40 MG capsule TAKE 1 CAPSULE (40 MG TOTAL)  BY MOUTH  DAILY BEFORE BREAKFAST. (Patient taking differently: Take 40 mg by mouth every morning. ) 30 capsule 5  . fluticasone (FLONASE) 50 MCG/ACT nasal spray Place 1 spray into both nostrils daily as needed for allergies.     . furosemide (LASIX) 40 MG tablet TAKE 1 TABLET DAILY (Patient taking differently: Take 40 mg by mouth daily. ) 90 tablet 0  . levETIRAcetam (KEPPRA) 500 MG tablet Take 1 tablet (500 mg total) by mouth 2 (two) times daily. 180 tablet 3  . levothyroxine (SYNTHROID, LEVOTHROID) 88 MCG tablet Take 88 mcg by mouth every morning.    Marland Kitchen losartan-hydrochlorothiazide (HYZAAR) 50-12.5 MG tablet Take 1 tablet by mouth daily.     . metoprolol succinate (TOPROL-XL) 100 MG 24 hr tablet Take 100 mg by mouth 2 (two) times daily. Take with or immediately following a meal.     . nitroGLYCERIN (NITROSTAT) 0.4 MG SL tablet Place 1 tablet (0.4 mg total) under the tongue every 5 (five) minutes as needed for chest pain. 25 tablet 3  . polyethylene glycol (MIRALAX / GLYCOLAX) packet Take 17 g by mouth daily as needed for mild constipation.     . Polyvinyl Alcohol-Povidone (MURINE TEARS FOR DRY EYES) 5-6 MG/ML SOLN Place 1 drop into both eyes every morning. As needed    . potassium chloride SA (K-DUR,KLOR-CON) 20 MEQ tablet Take 2 tablets (40 mEq total) by mouth daily. 180 tablet 3  . prasugrel (EFFIENT) 5 MG TABS tablet Take 1 tablet (5 mg total) by mouth daily. 30 tablet 6  . traMADol (ULTRAM) 50 MG tablet Take 1 tablet (50 mg total) by mouth every 12 (twelve) hours as needed. for pain 60 tablet 0   No facility-administered medications prior to visit.     PAST MEDICAL HISTORY: Past Medical History:  Diagnosis Date  . Asthmatic bronchitis   . CAD (coronary artery disease)    bare metal stents to 2 separate RCA lesions August, 2011, residual 60% LAD, medical therapy  /  plan was for effient for one month followed by Plavix,, but patient does not tolerate Plavix... therefore patient placed back on effient   . Carotid artery disease ([place])   . Chronic back pain   . Chronic hip pain   . Dementia ([place])   . Dementia ([place])   . Diabetes mellitus    does not have  . Diverticulitis    remote hx  . Edema 03/29/2010   September, 2011  . Ejection fraction    EF 65 %... normal... catheterization 02/2010.  . Fracture of rib of left side 04/24/2011  . Gait abnormality   . GERD (gastroesophageal reflux disease)   . Hip fracture [facility])    October, 2012  . Hyperglycemia 04/24/2011  . Hypertension   . Hypothyroidism 04/24/2011  . Intolerance of drug    aspirin and plavix  . Mitral regurgitation  mild, echo, June, 2007  . Obesity   . Paroxysmal supraventricular tachycardia ([facility])    infrequent episodes over the years..palpitations.  . Sciatica     PAST SURGICAL HISTORY: Past Surgical History:  Procedure Laterality Date  . ANKLE FRACTURE SURGERY  12/1993   [place]  . ARTHROPLASTY W/ ARTHROSCOPY MEDIAL / LATERAL COMPARTMENT KNEE     [facility], should read arthroscopy not arthroplasty  . CARDIAC CATHETERIZATION  02/22/2010   2 stents, [place]  . ESOPHAGOGASTRODUODENOSCOPY  12/06/11   fields: Negative H. pylori, hiatal hernia/moderate gastritis  . ileocolonscopy  12/06/11   fields:diverticula throughtout the colon/inertnal hemorrhoids/  . KNEE ARTHROSCOPY  11/2001   left knee  . KNEE ARTHROSCOPY  02/19/2011   Procedure: ARTHROSCOPY KNEE;  Surgeon: Sanjuana Kava;  Location: [facility];  Service: Orthopedics;  Laterality: Right;  Medial and Lateral Partial Menisectomy  . LAPAROSCOPIC CHOLECYSTECTOMY  04/29/1996   [place] [facility]  . ORIF HIP FRACTURE  04/25/2011   Procedure: OPEN REDUCTION INTERNAL FIXATION HIP;  Surgeon: Sanjuana Kava;  Location: [facility];  Service: Orthopedics;  Laterality: Left;  . SP ARTHRO THUMB*R*  05/1989   [place]  . TOTAL ABDOMINAL HYSTERECTOMY  1983   [place]  . TUBAL LIGATION  09/1970    FAMILY HISTORY: Family History  Problem Relation Age of Onset  . Diabetes Mother   . Arthritis Mother   .  Heart attack Father   . Stroke Father   . Emphysema Father   . Cataracts Father   . Cancer Sister   . Heart disease Sister   . Arthritis Sister   . Arthritis Brother   . Allergies Brother   . Colon cancer Neg Hx     SOCIAL HISTORY: Social History   Socioeconomic History  . Marital status: Widowed    Spouse name: Not on file  . Number of children: 6  . Years of education: college  . Highest education level: Not on file  Occupational History  . 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 Financial resource strain: Not on file  . Food insecurity:    Worry: Not on file    Inability: Not on file  . Transportation needs:    Medical: Not on file    Non-medical: Not on file  Tobacco Use  . Smoking status: Never Smoker  . Smokeless tobacco: Never Used  Substance and Sexual Activity  . Alcohol use: No    Alcohol/week: 1.0 standard drinks    Types: 1 Glasses of wine per week    Comment: rarely  . Drug use: No  . Sexual activity: Never  Lifestyle  . Physical activity:    Days per week: Not on file    Minutes per session: Not on file  . Stress: Not on file  Relationships  . Social connections:    Talks on phone: Not on file    Gets together: Not on file    Attends religious service: Not on file    Active member of club or organization: Not on file    Attends meetings of clubs or organizations: Not on file    Relationship status: Not on file  . Intimate partner violence:    Fear of current or ex partner: Not on file    Emotionally abused: Not on file    Physically abused: Not on file    Forced sexual activity: Not on file  Other Topics Concern  . Not on file  Social History Narrative   .  Retired Education officer, museum.    Patient has a Best boy.   Right handed.   Caffeine. None   Patient lives at home with her sister Bartolo Darter)            PHYSICAL EXAM  There were no vitals filed for this visit. There is no height or weight on file to  calculate BMI.  Generalized: Well developed, in no acute distress  Head: normocephalic and atraumatic,. Oropharynx benign  Neck: Supple,  Musculoskeletal: No deformity   Neurological examination   Mentation: Alert provides some of history, and relies on grandson.  Follows commands speech and language fluent. MMSE not done  Cranial nerve II-XII: Pupils were equal round reactive to light extraocular movements were full, visual field were full on confrontational test. Facial sensation and strength were normal. hearing was intact to finger rubbing bilaterally. Uvula tongue midline. head turning and shoulder shrug were normal and symmetric.Tongue protrusion into cheek strength was normal. Motor: normal bulk and tone, full strength in the BUE, 4/5 BLE,  Sensory: decreased to light touch, pinprick, and  Vibration, to about ankles Coordination: finger-nose-finger,  no dysmetria Reflexes: Symmetric upper and lower, plantar responses were flexor bilaterally. Gait and Station: in wheelchair today and not ambulated due to safety concerns DIAGNOSTIC DATA (LABS, IMAGING, TESTING) - I reviewed patient records, labs, notes, testing and imaging myself where available.  Lab Results  Component Value Date   WBC 9.6 05/06/2018   HGB 12.4 05/06/2018   HCT 40.5 05/06/2018   MCV 91.4 05/06/2018   PLT 335 05/06/2018      Component Value Date/Time   NA 134 (L) 05/06/2018 1455   K 4.7 05/06/2018 1455   CL 102 05/06/2018 1455   CO2 27 05/06/2018 1455   GLUCOSE 143 (H) 05/06/2018 1455   BUN 30 (H) 05/06/2018 1455   CREATININE 1.05 (H) 05/06/2018 1455   CALCIUM 8.8 (L) 05/06/2018 1455   PROT 8.4 (H) 01/13/2016 0758   ALBUMIN 3.0 (L) 01/13/2016 0758   AST 15 01/13/2016 0758   ALT 14 01/13/2016 0758   ALKPHOS 103 01/13/2016 0758   BILITOT 0.4 01/13/2016 0758   GFRNONAA 47 (L) 05/06/2018 1455   GFRAA 55 (L) 05/06/2018 1455     ASSESSMENT AND PLAN 85  years old Caucasian female with complex partial  seizure disorder, and  memory loss episodes of confusion. MRI of the brain has demonstrates diffuse atrophy, moderate small vessel disease,   also one tiny acute stroke involving corpus callosum, which would not explain her current symptoms, her confusion episode has much improved with Keppra 500 twice a day, EEG was normal,        PLAN: Continue Keppra 500mg  twice daily will refill Continue Celexa 20 daily just refilled for 1 year Use cane or walker at all times for safe ambulation , Recommend some in-home help physical therapy for strengthening exercises so the patient has better endurance with walking  F/U in 8 months I spent 25 min in total face to face time with the patient /grandson more than 50% of which was spent counseling and coordination of care, reviewing test results reviewing medications and discussing and reviewing the diagnosis of seizure disorder and memory loss and gait disorder and benefit of PT followed by HEP  Answered multiple questions  Dennie Bible, [facility], [facility], APRN  [facility] [address] [place], [place] [address] [phone]

## 2018-09-14 ENCOUNTER — Ambulatory Visit: Payer: Medicare Other | Admitting: Nurse Practitioner

## 2018-09-15 ENCOUNTER — Encounter: Payer: Self-pay | Admitting: Nurse Practitioner

## 2018-10-26 ENCOUNTER — Ambulatory Visit: Payer: Medicare Other | Admitting: Adult Health

## 2018-11-26 ENCOUNTER — Other Ambulatory Visit: Payer: Self-pay | Admitting: Neurology

## 2018-11-28 ENCOUNTER — Emergency Department (HOSPITAL_COMMUNITY)
Admission: EM | Admit: 2018-11-28 | Discharge: 2018-11-28 | Disposition: A | Discharge status: Home / Self Care | Payer: Medicare Other | Attending: Emergency Medicine | Admitting: Emergency Medicine

## 2018-11-28 ENCOUNTER — Emergency Department (HOSPITAL_COMMUNITY): Payer: Medicare Other

## 2018-11-28 ENCOUNTER — Other Ambulatory Visit: Payer: Self-pay

## 2018-11-28 ENCOUNTER — Encounter (HOSPITAL_COMMUNITY): Payer: Self-pay | Admitting: Emergency Medicine

## 2018-11-28 DIAGNOSIS — F0391 Unspecified dementia with behavioral disturbance: Secondary | ICD-10-CM | POA: Diagnosis not present

## 2018-11-28 DIAGNOSIS — Z79899 Other long term (current) drug therapy: Secondary | ICD-10-CM | POA: Insufficient documentation

## 2018-11-28 DIAGNOSIS — W06XXXA Fall from bed, initial encounter: Secondary | ICD-10-CM | POA: Insufficient documentation

## 2018-11-28 DIAGNOSIS — R51 Headache: Secondary | ICD-10-CM | POA: Diagnosis not present

## 2018-11-28 DIAGNOSIS — Y92009 Unspecified place in unspecified non-institutional (private) residence as the place of occurrence of the external cause: Secondary | ICD-10-CM

## 2018-11-28 DIAGNOSIS — Y9389 Activity, other specified: Secondary | ICD-10-CM | POA: Insufficient documentation

## 2018-11-28 DIAGNOSIS — S60221A Contusion of right hand, initial encounter: Secondary | ICD-10-CM | POA: Insufficient documentation

## 2018-11-28 DIAGNOSIS — E119 Type 2 diabetes mellitus without complications: Secondary | ICD-10-CM | POA: Insufficient documentation

## 2018-11-28 DIAGNOSIS — Z23 Encounter for immunization: Secondary | ICD-10-CM | POA: Diagnosis not present

## 2018-11-28 DIAGNOSIS — I10 Essential (primary) hypertension: Secondary | ICD-10-CM | POA: Insufficient documentation

## 2018-11-28 DIAGNOSIS — Y92003 Bedroom of unspecified non-institutional (private) residence as the place of occurrence of the external cause: Secondary | ICD-10-CM | POA: Diagnosis not present

## 2018-11-28 DIAGNOSIS — W19XXXA Unspecified fall, initial encounter: Secondary | ICD-10-CM

## 2018-11-28 DIAGNOSIS — I259 Chronic ischemic heart disease, unspecified: Secondary | ICD-10-CM | POA: Diagnosis not present

## 2018-11-28 DIAGNOSIS — S0990XA Unspecified injury of head, initial encounter: Secondary | ICD-10-CM | POA: Diagnosis present

## 2018-11-28 DIAGNOSIS — S80812A Abrasion, left lower leg, initial encounter: Secondary | ICD-10-CM | POA: Insufficient documentation

## 2018-11-28 DIAGNOSIS — Y999 Unspecified external cause status: Secondary | ICD-10-CM | POA: Insufficient documentation

## 2018-11-28 DIAGNOSIS — M542 Cervicalgia: Secondary | ICD-10-CM | POA: Diagnosis not present

## 2018-11-28 MED ORDER — ACETAMINOPHEN 325 MG PO TABS
650.0000 mg | ORAL_TABLET | Freq: Once | ORAL | Status: AC
  Administered 2018-11-28: 650 mg via ORAL
  Filled 2018-11-28: qty 2

## 2018-11-28 MED ORDER — TETANUS-DIPHTH-ACELL PERTUSSIS 5-2.5-18.5 LF-MCG/0.5 IM SUSP
0.5000 mL | Freq: Once | INTRAMUSCULAR | Status: AC
  Administered 2018-11-28: 0.5 mL via INTRAMUSCULAR
  Filled 2018-11-28: qty 0.5

## 2018-11-28 NOTE — ED Triage Notes (Addendum)
Pt from home with daughter and son. Pt slid out of bed on Tuesday.  Pt now c/o of right hand pain and skin tear to her left leg. Pt on blood thinners.  Family stated to EMS concern regarding bleeding of skin tear

## 2018-11-28 NOTE — ED Provider Notes (Signed)
[facility] EMERGENCY DEPARTMENT Provider Note   CSN: [id number] Arrival date & time: 11/28/18  1210    History   Chief Complaint Chief Complaint  Patient presents with   Fall    HPI Katie Ford is a 85 y.o. female.     The history is provided by the EMS personnel, the patient and a relative. The history is limited by the condition of the patient (Hx dementia).  Fall     Pt was seen at 1220.  Per EMS, pt's family, and pt report:  Pt slid out of bed 4 days ago. Family states pt c/o right hand pain and skin tear to left anterior tibial area. Denies new falls. Denies cough, fevers, known COVID+ exposure. Denies LOC, no AMS from baseline dementia. Denies CP, no SOB, no abd pain, no vomiting/diarrhea, no neck or back pain, no focal motor weakness.    Past Medical History:  Diagnosis Date   Asthmatic bronchitis    CAD (coronary artery disease)    bare metal stents to 2 separate RCA lesions August, 2011, residual 60% LAD, medical therapy  /  plan was for effient for one month followed by Plavix,, but patient does not tolerate Plavix... therefore patient placed back on effient   Carotid artery disease ([place])    Chronic back pain    Chronic hip pain    Dementia ([place])    Dementia ([place])    Diabetes mellitus    does not have   Diverticulitis    remote hx   Edema 03/29/2010   September, 2011   Ejection fraction    EF 65 %... normal... catheterization 02/2010.   Fracture of rib of left side 04/24/2011   Gait abnormality    GERD (gastroesophageal reflux disease)    Hip fracture ([place])    October, 2012   Hyperglycemia 04/24/2011   Hypertension    Hypothyroidism 04/24/2011   Intolerance of drug    aspirin and plavix   Mitral regurgitation    mild, echo, June, 2007   Obesity    Paroxysmal supraventricular tachycardia (HCC)    infrequent episodes over the years..palpitations.   Sciatica     Patient Active Problem List   Diagnosis Date Noted    Abnormality of gait 10/21/2017   Dementia ([place]) 03/11/2017   Dementia with behavioral problem ([place]) 09/11/2016   Lumbar stenosis 09/11/2015   Complex partial seizure ([place]) 09/02/2013   Subacute confusional state 07/30/2013   Carotid artery disease ([place])    Diverticulosis 03/06/2012   Abnormal CT of the abdomen 11/25/2011   Abdominal pain, unspecified site 11/25/2011   Ejection fraction    Intolerance of drug    Hip fracture ([place])    UTI (urinary tract infection) 04/25/2011   Leukocytosis 04/24/2011   Hyponatremia 04/24/2011   Fracture of left hip ([place]) 04/24/2011   Asthmatic bronchitis 04/24/2011   Anemia 04/24/2011   Hypothyroidism 04/24/2011   DM2 (diabetes mellitus, type 2) ([place]) 04/24/2011   Fracture of rib of left side 04/24/2011   Mitral regurgitation    Paroxysmal supraventricular tachycardia (HCC)    Hypertension    Asthmatic bronchitis    Obesity    GERD (gastroesophageal reflux disease)    CAD (coronary artery disease)    Edema 03/29/2010    Past Surgical History:  Procedure Laterality Date   ANKLE FRACTURE SURGERY  12/1993   [place]   ARTHROPLASTY W/ ARTHROSCOPY MEDIAL / LATERAL COMPARTMENT KNEE     [facility], should read arthroscopy not arthroplasty  CARDIAC CATHETERIZATION  02/22/2010   2 stents, [place]   ESOPHAGOGASTRODUODENOSCOPY  12/06/11   fields: Negative H. pylori, hiatal hernia/moderate gastritis   ileocolonscopy  12/06/11   fields:diverticula throughtout the colon/inertnal hemorrhoids/   KNEE ARTHROSCOPY  11/2001   left knee   KNEE ARTHROSCOPY  02/19/2011   Procedure: ARTHROSCOPY KNEE;  Surgeon: Sanjuana Kava;  Location: [facility];  Service: Orthopedics;  Laterality: Right;  Medial and Lateral Partial Menisectomy   LAPAROSCOPIC CHOLECYSTECTOMY  04/29/1996   [facility] [facility]   ORIF HIP FRACTURE  04/25/2011   Procedure: OPEN REDUCTION INTERNAL FIXATION HIP;  Surgeon: Sanjuana Kava;  Location: [facility];  Service: Orthopedics;  Laterality:  Left;   SP ARTHRO THUMB*R*  05/1989   [place]   TOTAL ABDOMINAL HYSTERECTOMY  1983   [place]   TUBAL LIGATION  09/1970     OB History    Gravida  6   Para  6   Term  6   Preterm      AB      Living        SAB      TAB      Ectopic      Multiple      Live Births               Home Medications    Prior to Admission medications   Medication Sig Start Date End Date Taking? Authorizing Provider  albuterol (PROVENTIL) (2.5 MG/3ML) 0.083% nebulizer solution Take 2.5 mg by nebulization daily as needed for wheezing or shortness of breath.  12/12/15   [provider]  ALPRAZolam Duanne Moron) 0.5 MG tablet Take 0.25-0.5 mg by mouth daily as needed for anxiety or sleep. TAKE HALF TO ONE  A TABLET IF NEEDED FOR ANXIETY    [provider]  alum & mag hydroxide-simeth (MYLANTA) 200-200-20 MG/5ML suspension Take 15 mLs by mouth every 6 (six) hours as needed for indigestion or heartburn.    [provider]  amLODipine (NORVASC) 10 MG tablet Take 10 mg by mouth every morning.     [provider]  BREO ELLIPTA 100-25 MCG/INH AEPB Inhale 1 puff into the lungs daily. 12/04/15   [provider]  cetirizine (ZYRTEC) 10 MG tablet Take 5 mg by mouth daily.     [provider]  citalopram (CELEXA) 10 MG tablet Take 2 tablets (20 mg total) by mouth daily. 11/26/18   Marcial Pacas, MD  esomeprazole (NEXIUM) 40 MG capsule TAKE 1 CAPSULE (40 MG TOTAL)  BY MOUTH DAILY BEFORE BREAKFAST. Patient taking differently: Take 40 mg by mouth every morning.  01/24/14   Fields, Marga Melnick, MD  fluticasone (FLONASE) 50 MCG/ACT nasal spray Place 1 spray into both nostrils daily as needed for allergies.  06/25/13   [provider]  furosemide (LASIX) 40 MG tablet TAKE 1 TABLET DAILY Patient taking differently: Take 40 mg by mouth daily.  07/10/18   Herminio Commons, MD  levETIRAcetam (KEPPRA) 500 MG tablet Take 1 tablet (500 mg total) by mouth 2 (two) times daily.  10/21/17   Dennie Bible, NP  levothyroxine (SYNTHROID, LEVOTHROID) 88 MCG tablet Take 88 mcg by mouth every morning. 08/08/18   [provider]  losartan-hydrochlorothiazide (HYZAAR) 50-12.5 MG tablet Take 1 tablet by mouth daily.  01/20/17   [provider]  metoprolol succinate (TOPROL-XL) 100 MG 24 hr tablet Take 100 mg by mouth 2 (two) times daily. Take with or immediately following a meal.  [provider]  nitroGLYCERIN (NITROSTAT) 0.4 MG SL tablet Place 1 tablet (0.4 mg total) under the tongue every 5 (five) minutes as needed for chest pain. 12/26/15   Herminio Commons, MD  polyethylene glycol (MIRALAX / GLYCOLAX) packet Take 17 g by mouth daily as needed for mild constipation.     [provider]  Polyvinyl Alcohol-Povidone (MURINE TEARS FOR DRY EYES) 5-6 MG/ML SOLN Place 1 drop into both eyes every morning. As needed    [provider]  potassium chloride SA (K-DUR,KLOR-CON) 20 MEQ tablet Take 2 tablets (40 mEq total) by mouth daily. 05/06/18   Strader, Fransisco Hertz, PA-C  prasugrel (EFFIENT) 5 MG TABS tablet Take 1 tablet (5 mg total) by mouth daily. 03/09/15   Carlena Bjornstad, MD  traMADol (ULTRAM) 50 MG tablet Take 1 tablet (50 mg total) by mouth every 12 (twelve) hours as needed. for pain 04/28/16   Sanjuana Kava, MD    Family History Family History  Problem Relation Age of Onset   Diabetes Mother    Arthritis Mother    Heart attack Father    Stroke Father    Emphysema Father    Cataracts Father    Cancer Sister    Heart disease Sister    Arthritis Sister    Arthritis Brother    Allergies Brother    Colon cancer Neg Hx     Social History Social History   Tobacco Use   Smoking status: Never Smoker   Smokeless tobacco: Never Used  Substance Use Topics   Alcohol use: No    Alcohol/week: 1.0 standard drinks    Types: 1 Glasses of wine per week    Comment: rarely   Drug use: No     Allergies     Aricept [donepezil hcl]; Celecoxib; Codeine; Hydrocodone; Tartrazine; and Aspirin   Review of Systems Review of Systems  Unable to perform ROS: Dementia     Physical Exam Updated Vital Signs BP 127/71    Temp 98.1 F (36.7 C)    Resp 17    Ht 5\' 2"  (1.575 m)    SpO2 95%    BMI 26.52 kg/m   Physical Exam 1225: Physical examination: Vital signs and O2 SAT: Reviewed; Constitutional: Well developed, Well nourished, Well hydrated, In no acute distress; Head and Face: Normocephalic, Atraumatic; Eyes: EOMI, PERRL, No scleral icterus; ENMT: Mouth and pharynx normal, Mucous membranes moist; Neck: Immobilized in C-collar, Trachea midline. No abrasions or ecchymosis.; Spine: No midline CS, TS, LS tenderness.; Cardiovascular: Regular rate and rhythm, No gallop; Respiratory: Breath sounds clear & equal bilaterally, No wheezes, Normal respiratory effort/excursion; Chest: Nontender, No deformity, Movement normal, No crepitus, No abrasions or ecchymosis.; Abdomen: Soft, Nontender, Nondistended, Normal bowel sounds, No abrasions or ecchymosis.; Genitourinary: No CVA tenderness;; Extremities: +right dorsal hand with edema, TTP all metacarpals and fingers, and localized ecchymosis. No open wounds. No specific area of point tenderness. NT right wrist. No obvious deformity.  +very superficial abrasion left mid-anterior tibial area without active bleeding, no drainage. Otherwise full range of motion major/large joints of bilat UE's and LE's without pain or tenderness to palp, Neurovascularly intact, Pulses normal, No deformity.  No edema, Pelvis stable; Neuro: Awake, alert, confused per hx dementia. No facial droop. Speech clear.  Pt moves all extremities on stretcher without apparent gross focal motor deficits in extremities.; Skin: Color normal, Warm, Dry    ED Treatments / Results  Labs (all labs ordered are listed, but only abnormal results  are  displayed)   EKG None  Radiology   Procedures Procedures (including critical care time)  Medications Ordered in ED Medications - No data to display   Initial Impression / Assessment and Plan / ED Course  I have reviewed the triage vital signs and the nursing notes.  Pertinent labs & imaging results that were available during my care of the patient were reviewed by me and considered in my medical decision making (see chart for details).     MDM Reviewed: previous chart, nursing note and vitals Interpretation: x-ray and CT scan    Dg Wrist Complete Right Result Date: 11/28/2018 CLINICAL DATA:  Acute RIGHT wrist pain following fall. Initial encounter. EXAM: RIGHT WRIST - COMPLETE 3+ VIEW COMPARISON:  None. FINDINGS: No acute fracture, subluxation or dislocation identified. Degenerative changes at the 1st carpometacarpal joint noted. No suspicious focal bony lesions are present. IMPRESSION: No acute bony abnormality. Electronically Signed   By: Margarette Canada M.D.   On: 11/28/2018 14:12   Dg Tibia/fibula Left Result Date: 11/28/2018 CLINICAL DATA:  Acute LEFT LOWER leg pain following fall. Initial encounter. EXAM: LEFT TIBIA AND FIBULA - 2 VIEW COMPARISON:  None. FINDINGS: No acute fracture, subluxation or dislocation. Severe degenerative changes in the knee noted. No suspicious focal bony lesions are present. IMPRESSION: No acute bony abnormality. Electronically Signed   By: Margarette Canada M.D.   On: 11/28/2018 14:09   Ct Head Wo Contrast Result Date: 11/28/2018 CLINICAL DATA:  85 year old female with acute headache and neck pain following a fall several days ago. Initial encounter. EXAM: CT HEAD WITHOUT CONTRAST CT CERVICAL SPINE WITHOUT CONTRAST TECHNIQUE: Multidetector CT imaging of the head and cervical spine was performed following the standard protocol without intravenous contrast. Multiplanar CT image reconstructions of the cervical spine were also generated. COMPARISON:  12/09/2016  head CT and prior studies FINDINGS: CT HEAD FINDINGS Brain: No evidence of acute infarction, hemorrhage, hydrocephalus, extra-axial collection or mass lesion/mass effect. Atrophy, chronic small-vessel white matter ischemic changes and remote LEFT basal ganglia lacunar infarcts versus enlarged perivascular spaces again noted. Vascular: Carotid atherosclerotic calcifications again noted. Skull: Normal. Negative for fracture or focal lesion. Sinuses/Orbits: Mucosal thickening within bilateral ethmoid air cells and sphenoid sinuses noted. Other: None CT CERVICAL SPINE FINDINGS Alignment: Normal. Skull base and vertebrae: No acute fracture. No primary bone lesion or focal pathologic process. Soft tissues and spinal canal: No prevertebral fluid or swelling. No visible canal hematoma. Disc levels: Mild multilevel facet arthropathy and moderate degenerative disc disease at C5-6 and C6-7 noted. Upper chest: No acute abnormality Other: None IMPRESSION: 1. No evidence of acute intracranial abnormality. Atrophy and chronic small-vessel white matter ischemic changes. 2. No static evidence of acute injury to the cervical spine. Electronically Signed   By: Margarette Canada M.D.   On: 11/28/2018 13:51   Ct Cervical Spine Wo Contrast Result Date: 11/28/2018 CLINICAL DATA:  85 year old female with acute headache and neck pain following a fall several days ago. Initial encounter. EXAM: CT HEAD WITHOUT CONTRAST CT CERVICAL SPINE WITHOUT CONTRAST TECHNIQUE: Multidetector CT imaging of the head and cervical spine was performed following the standard protocol without intravenous contrast. Multiplanar CT image reconstructions of the cervical spine were also generated. COMPARISON:  12/09/2016 head CT and prior studies FINDINGS: CT HEAD FINDINGS Brain: No evidence of acute infarction, hemorrhage, hydrocephalus, extra-axial collection or mass lesion/mass effect. Atrophy, chronic small-vessel white matter ischemic changes and remote LEFT basal  ganglia lacunar infarcts versus enlarged perivascular spaces again noted.  Vascular: Carotid atherosclerotic calcifications again noted. Skull: Normal. Negative for fracture or focal lesion. Sinuses/Orbits: Mucosal thickening within bilateral ethmoid air cells and sphenoid sinuses noted. Other: None CT CERVICAL SPINE FINDINGS Alignment: Normal. Skull base and vertebrae: No acute fracture. No primary bone lesion or focal pathologic process. Soft tissues and spinal canal: No prevertebral fluid or swelling. No visible canal hematoma. Disc levels: Mild multilevel facet arthropathy and moderate degenerative disc disease at C5-6 and C6-7 noted. Upper chest: No acute abnormality Other: None IMPRESSION: 1. No evidence of acute intracranial abnormality. Atrophy and chronic small-vessel white matter ischemic changes. 2. No static evidence of acute injury to the cervical spine. Electronically Signed   By: Margarette Canada M.D.   On: 11/28/2018 13:51   Dg Knee Complete 4 Views Right Result Date: 11/28/2018 CLINICAL DATA:  Acute RIGHT knee pain following fall. Initial encounter. EXAM: RIGHT KNEE - COMPLETE 4+ VIEW COMPARISON:  None. FINDINGS: No definite acute fracture noted. Severe tricompartmental degenerative changes are noted. No dislocation. It is difficult to evaluate for knee effusion given degenerative changes. IMPRESSION: No definite acute bony injury. Severe tricompartmental degenerative changes Electronically Signed   By: Margarette Canada M.D.   On: 11/28/2018 14:20   Dg Hand Complete Right Result Date: 11/28/2018 CLINICAL DATA:  Acute RIGHT hand pain following fall. Initial encounter. EXAM: RIGHT HAND - COMPLETE 3+ VIEW COMPARISON:  None. FINDINGS: Slight overlap of bone at the 2nd MCP joint noted on the frontal view is not well evaluated on the LATERAL view and may be positional but correlate with 2nd MCP dislocation. No acute fracture identified. Degenerative changes in the PIP, DIP and 1st carpometacarpal joints noted.  IMPRESSION: Slight overlap of bone at the 2nd MCP joint-correlate with possible 2nd MCP dislocation as this is not evaluated on the LATERAL view. No acute fracture. Electronically Signed   By: Margarette Canada M.D.   On: 11/28/2018 14:16   Dg Hip Unilat With Pelvis 2-3 Views Right Result Date: 11/28/2018 CLINICAL DATA:  Acute RIGHT hip pain following fall. Initial encounter. EXAM: DG HIP (WITH OR WITHOUT PELVIS) 2-3V RIGHT COMPARISON:  None. FINDINGS: No definite fracture or dislocation noted but diffuse osteopenia slightly limits sensitivity for fracture. No focal bony lesions are identified. Surgical hardware within the proximal LEFT femur is noted. IMPRESSION: No evidence of acute bony abnormality. Electronically Signed   By: Margarette Canada M.D.   On: 11/28/2018 [place] was evaluated in Emergency Department on 11/28/2018 for the symptoms described in the history of present illness. She was evaluated in the context of the global COVID-19 pandemic, which necessitated consideration that the patient might be at risk for infection with the SARS-CoV-2 virus that causes COVID-19. Institutional protocols and algorithms that pertain to the evaluation of patients at risk for COVID-19 are in a state of rapid change based on information released by regulatory bodies including the CDC and federal and state organizations. These policies and algorithms were followed during the patient's care in the ED.     1500:  Pt's right hand re-examined:  There is no specific point tenderness to 2nd MCP, and is able to to move all her right fingers (including index finger) through flexion/extension (grips/closes fist then extends fingers). Doubt 2nd MCP dislocation and XR findings most likely positional. Will apply splint to right hand, as pt does say "ow" when her dorsal hand is palpated, though significantly less so when distracted. Wound care given and Td updated. Pt's family  states pt is mostly bedbound or  chairbound, slumped over and minimally stands/walks (this is c/w previous Neuro MD note from 10/2017). Pt appears at baseline now.  No clear indication for admission at this time. Will d/c back home with family, stable. Dx and testing, as well as incidental finding(s), d/w pt's family.  Questions answered.  Verb understanding, agreeable to d/c home with outpt f/u.        Final Clinical Impressions(s) / ED Diagnoses   Final diagnoses:  None    ED Discharge Orders    None       Francine Graven, DO 12/04/18 0732

## 2018-11-28 NOTE — ED Notes (Signed)
Patient transported to CT 

## 2018-11-28 NOTE — Discharge Instructions (Signed)
Take your usual prescriptions as previously directed.  Take over the counter tylenol, as directed on packaging, as needed for discomfort. Wash the abraded area gently with soap and water, and pat dry, at least twice a day, and cover with a clean/dry dressing.  Change the dressing whenever it becomes wet or soiled after washing the area with soap and water and patting dry. Wear the splint on your hand for comfort until you are seen in follow up. Call your regular medical doctor on Monday to schedule a follow up appointment within the next 2 to 3 days. Call the Orthopedic doctor on Monday to schedule a follow up appointment within the week. Return to the Emergency Department immediately if worsening.

## 2018-11-28 NOTE — ED Notes (Signed)
While putting hand brace on hand, pt denied pain.  Pt grabbed the railing with right hand and able to open and close right hand.  Per family, pt states " she walks doing very minimal walking and is usually slouched over in a bed or chair.  That's her normal".

## 2018-11-28 NOTE — ED Notes (Signed)
Pt was unable to sign for herself.

## 2018-12-10 ENCOUNTER — Other Ambulatory Visit: Payer: Self-pay | Admitting: *Deleted

## 2018-12-10 MED ORDER — LEVETIRACETAM 500 MG PO TABS: 500.0000 mg | ORAL_TABLET | Freq: Two times a day (BID) | ORAL | 0 refills | Status: DC

## 2019-01-07 ENCOUNTER — Telehealth: Payer: Self-pay

## 2019-01-07 NOTE — Telephone Encounter (Signed)
I called pts son ROd that due to the COVID 19 the visit will be change to mychart video. I text him the link to create account for his mom. He has a cell phone with a camera. HE gave verbal consent to do video and to file insurance for pt. He knows to log onto account and click appt. I updated meds, pharmacy and pcp. He verbalized understanding.

## 2019-01-13 ENCOUNTER — Other Ambulatory Visit: Payer: Self-pay | Admitting: Neurology

## 2019-01-13 ENCOUNTER — Other Ambulatory Visit: Payer: Self-pay | Admitting: Student

## 2019-01-13 ENCOUNTER — Telehealth: Payer: Self-pay | Admitting: Adult Health

## 2019-03-05 ENCOUNTER — Other Ambulatory Visit: Payer: Self-pay | Admitting: Neurology

## 2019-03-08 ENCOUNTER — Other Ambulatory Visit: Payer: Self-pay | Admitting: Neurology

## 2019-03-11 ENCOUNTER — Telehealth: Payer: Self-pay | Admitting: Adult Health

## 2019-03-11 NOTE — Telephone Encounter (Signed)
I called this patient regarding rescheduling her 9/24 appt due to NP time off. Patient previously was scheduled in June for a MyChart visit but that was rescheduled due to pt not feeling well. I spoke with patient's son, who agrees to do a virtual visit with patient on 8/24. Pt son verbalized understanding of the steps of initiating the virtual visit and I advised him to call back if needed.

## 2019-03-15 ENCOUNTER — Telehealth: Payer: Medicare Other | Admitting: Adult Health

## 2019-03-15 ENCOUNTER — Encounter

## 2019-04-05 ENCOUNTER — Telehealth: Payer: Self-pay

## 2019-04-05 ENCOUNTER — Telehealth: Payer: Medicare Other | Admitting: Adult Health

## 2019-04-05 NOTE — Telephone Encounter (Signed)
Patient was a no call/no show for their appointment today.   

## 2019-04-05 NOTE — Progress Notes (Deleted)
[facility]  PATIENT: Katie Ford DOB: [birth year]   REASON FOR VISIT: Follow-up for seizure disorder and memory loss  HISTORY FROM: patient and grandson [name]    HISTORY OF PRESENT ILLNESS:Katie Ford is a 85  years old right-handed Caucasian female, accompanied by her sister, four daughters, referred by emergency room, and her primary care physician Dr. Deirdre Pippins for evaluation of recent onset confusion spells  She had a past medical history of anxiety, mitral valve regurgitation, coronary artery disease,cardiologist is Dr. Ron Parker, she is on effient, digoxin treatment.  She is a retired Careers information officer, remained to be physically active, including swimming regularly, she did has mild gait difficulty after her left hip fracture, and fixation surgery in  2012, she lives with her sister, prior her most recent hospital presentation in July 11 2013,she is very active, balance her checkbook,drive long distance without difficulty.  In July 11 2013, she was noticed to have difficulty sorted out her medications, got very frustrated, she complains of not feeling well, went to bed early that night, woke up around 1 AM in the morning, confused, she went to her sister's bedroom, on asking her where she is, later she went to bed, was noted by her family December 21st increased confusion, did not know where she is, she was taken to hospital, CAT scan of the brain showed previous old stroke in the left basal ganglion, chronic sinusitis, small vessel disease, no acute stroke,  Ultrasound of carotid arteries showed less than 50% stenosis of bilateral internal carotid artery  Bilateral lower extremity venous Doppler showed no DVT in bilateral lower extremity,  Bilateral hip X-ray demonstrated previous left hip surgery, no evidence of acute process,  She was treated with antibiotics Keflex for sinusitis, over the past 2 weeks, she has improved, but not back to her baseline,  she continued to have intermittent episodes of confusion, eye blinking spells, lasting for a few minutes, followed by extreme fatigue, tends to sleep afterwards  I have reviewed MRI together with patient and her family, MRI of the brain has demonstrated positive diffusion imaging shows a 5 mm focus of acute infarction in the midline body of the corpus callosum. No other acute infarction.  There chronic small-vessel changes affecting the pons.  There are moderate changes of chronic small vessel disease affecting the deep and subcortical cerebral hemispheric white matter. There was diffuse atrophy  She denies a previous history of seizure  UPDATE Sep 02, 2013:YY MRA of the brain was normal.    She fell, was taken to the emergency room, had MRI of pelvic showed no acute findings related to recent fall. Moderate right gluteus minimus tendinosis with adjacent bursal fluid. No tendon tear demonstrated. Left pelvic muscular atrophy related to remote left proximal femoral ORIF.  Lower lumbar spondylosis with chronic resulting foraminal  Stenosis.  MRI cervical in December 2014 Spondylosis at C5-6 contributes to mild central and moderate biforaminal stenosis.  Sometimes, urinary urgency since 2012, no change.   She was started on Keppra, 500mg  bid, she feels more alert, is also confirmed by her sister,  no more confusion episodes, no more spells  UPDATE Sep 02 2014:YY She is now taking Keppra 500mg  bid, no more seizure like event, no side effect, still swimming.  She is driving some, word finding difficulty, short memory trouble, she lives with her sister,   UPDATE Sep 11 2015:YY She still swim, her low lumbar spine compression fracture  I have personally reviewed MRI lumbar in  Dec 2016: Severe degenerative lumbar spondylosis with multilevel disc disease and facet disease, severe multifactorial spinal stenosis and bilateral recess stenosis at L3-4, L4-5, broad-based left foraminal  extraforaminal disc protrusion at L4-5 with significant left foraminal stenosis, Stable lower thoracic degenerative disc disease. Persistent spinal stenosis and mild malacia involving the lower thoracic cord at T11-12.  She has significant gait difficulty, low back pain, no recurrent seizure, tolerating Keppra 500 mg twice a day, continue has mild memory trouble  UPDATE Sep 11 2016:YY She is accompanied by her daughter and grandson at today's clinical visit, she had no recurrent seizure, but she had slight worsening memory loss, also becoming easily agitated, throw hot pan at her family members, chronic low back pain, hip pain, mild gait abnormality. UPDATE 08/21/2018CM Ms. Manual, 85 year old female returns for follow-up with history of seizure disorder and memory loss. Her memory has continued to decline. She has not had further seizure activity. She was placed on Seroquel and Remeron at her last visit with Dr. Krista Blue. Those medications were not started. She had a daughter die recently at the age of 45 unexpectedly. She continues to have low back pain and gait abnormality.No recent falls. Ambulates with a quad cane. Family members assist patient in her home.  UPDATE 4/2/2019CM Ms Ford, 85 year old female returns for follow-up with history of seizure disorder and memory loss.  Her memory has continued to decline at last visit and she was placed on Aricept but her son called in and stated she was having diarrhea on the drug so it was stopped.  She has been tried on Seroquel and Remeron in the past by Dr. Krista Blue but never started the medication she has significant back pain and gait abnormality.  She is seated in a wheelchair today with stooped posture.  She is with her grandson [name] with whom she lives.  Appetite is good and she denies any difficulty sleeping at night she has had several falls in the last 6 months she has not had any injury.  She continues to ambulate with a quad cane or rolling walker.   Anytime she tries to get up without assistive device and that is why she falls.  She returns for reevaluation  Update 04/05/2019: Katie Ford is being seen today for seizure and memory follow-up.  Memory has been ***.  She has remained on Keppra 500 mg twice daily without recurrent seizure activity.  She is mostly bedbound or chair bound as previously having worsening gait with increased falls.    REVIEW OF SYSTEMS: Full 14 system review of systems performed and notable only for those listed, all others are neg:  Constitutional: Fatigue   Cardiovascular: neg Ear/Nose/Throat: neg  Skin: neg Eyes: Blurred vision Respiratory: Chronic cough Gastroitestinal: neg  Hematology/Lymphatic: neg  Endocrine: neg Musculoskeletal:chronic back pain, gait disorder Allergy/Immunology: neg Neurological: memory loss, seizure disorder Psychiatric: neg Sleep : neg   ALLERGIES: Allergies  Allergen Reactions   Aricept [Donepezil Hcl]     All side effects   Celecoxib Other (See Comments)    TACHYCARDIA   Codeine Other (See Comments)    TACHYCARIDIA   Hydrocodone Other (See Comments)    TACHYCARDIA   Tartrazine Other (See Comments)    Causes Tachycardia   Aspirin     ONLY TARTRAZINE!!! Causes Tachycardia   Influenza Vaccines Other (See Comments)    HOME MEDICATIONS: Outpatient Medications Prior to Visit  Medication Sig Dispense Refill   albuterol (PROVENTIL) (2.5 MG/3ML) 0.083% nebulizer solution Take 2.5 mg by  nebulization daily as needed for wheezing or shortness of breath.      ALPRAZolam (XANAX) 0.5 MG tablet Take 0.25-0.5 mg by mouth daily as needed for anxiety or sleep. TAKE HALF TO ONE  A TABLET IF NEEDED FOR ANXIETY     amLODipine (NORVASC) 10 MG tablet Take 10 mg by mouth every morning.      BREO ELLIPTA 100-25 MCG/INH AEPB Inhale 1 puff into the lungs daily.     cetirizine (ZYRTEC) 10 MG tablet Take 5 mg by mouth daily.      citalopram (CELEXA) 10 MG tablet TAKE 2 TABLETS  EVERY DAY 60 tablet 1   esomeprazole (NEXIUM) 40 MG capsule TAKE 1 CAPSULE (40 MG TOTAL)  BY MOUTH DAILY BEFORE BREAKFAST. (Patient taking differently: Take 40 mg by mouth every morning. ) 30 capsule 5   fluticasone (FLONASE) 50 MCG/ACT nasal spray Place 1 spray into both nostrils daily as needed for allergies.      furosemide (LASIX) 40 MG tablet TAKE 1 TABLET DAILY (Patient taking differently: Take 40 mg by mouth every other day. ) 90 tablet 0   levETIRAcetam (KEPPRA) 500 MG tablet Take 1 tablet (500 mg total) by mouth 2 (two) times daily. 180 tablet 0   levothyroxine (SYNTHROID, LEVOTHROID) 88 MCG tablet Take 88 mcg by mouth every morning.     losartan-hydrochlorothiazide (HYZAAR) 50-12.5 MG tablet Take 1 tablet by mouth daily.      metoprolol succinate (TOPROL-XL) 100 MG 24 hr tablet Take 100 mg by mouth 2 (two) times daily. Take with or immediately following a meal.      nitroGLYCERIN (NITROSTAT) 0.4 MG SL tablet Place 1 tablet (0.4 mg total) under the tongue every 5 (five) minutes as needed for chest pain. 25 tablet 3   polyethylene glycol (MIRALAX / GLYCOLAX) packet Take 17 g by mouth daily as needed for mild constipation.      Polyvinyl Alcohol-Povidone (MURINE TEARS FOR DRY EYES) 5-6 MG/ML SOLN Place 1 drop into both eyes every morning. As needed     potassium chloride SA (K-DUR) 20 MEQ tablet TAKE 2 TABLETS DAILY 180 tablet 0   prasugrel (EFFIENT) 5 MG TABS tablet Take 1 tablet (5 mg total) by mouth daily. 30 tablet 6   traMADol (ULTRAM) 50 MG tablet Take 1 tablet (50 mg total) by mouth every 12 (twelve) hours as needed. for pain 60 tablet 0   No facility-administered medications prior to visit.     PAST MEDICAL HISTORY: Past Medical History:  Diagnosis Date   Asthmatic bronchitis    CAD (coronary artery disease)    bare metal stents to 2 separate RCA lesions August, 2011, residual 60% LAD, medical therapy  /  plan was for effient for one month followed by Plavix,, but  patient does not tolerate Plavix... therefore patient placed back on effient   Carotid artery disease ([place])    Chronic back pain    Chronic hip pain    Dementia ([place])    Dementia ([place])    Diabetes mellitus    does not have   Diverticulitis    remote hx   Edema 03/29/2010   September, 2011   Ejection fraction    EF 65 %... normal... catheterization 02/2010.   Fracture of rib of left side 04/24/2011   Gait abnormality    GERD (gastroesophageal reflux disease)    Hip fracture ([place])    October, 2012   Hyperglycemia 04/24/2011   Hypertension    Hypothyroidism 04/24/2011  Intolerance of drug    aspirin and plavix   Mitral regurgitation    mild, echo, June, 2007   Obesity    Paroxysmal supraventricular tachycardia ([facility])    infrequent episodes over the years..palpitations.   Sciatica     PAST SURGICAL HISTORY: Past Surgical History:  Procedure Laterality Date   ANKLE FRACTURE SURGERY  12/1993   [place]   ARTHROPLASTY W/ ARTHROSCOPY MEDIAL / LATERAL COMPARTMENT KNEE     [facility], should read arthroscopy not arthroplasty   CARDIAC CATHETERIZATION  02/22/2010   2 stents, [place]   ESOPHAGOGASTRODUODENOSCOPY  12/06/11   fields: Negative H. pylori, hiatal hernia/moderate gastritis   ileocolonscopy  12/06/11   fields:diverticula throughtout the colon/inertnal hemorrhoids/   KNEE ARTHROSCOPY  11/2001   left knee   KNEE ARTHROSCOPY  02/19/2011   Procedure: ARTHROSCOPY KNEE;  Surgeon: Sanjuana Kava;  Location: [facility];  Service: Orthopedics;  Laterality: Right;  Medial and Lateral Partial Menisectomy   LAPAROSCOPIC CHOLECYSTECTOMY  04/29/1996   [facility]   ORIF HIP FRACTURE  04/25/2011   Procedure: OPEN REDUCTION INTERNAL FIXATION HIP;  Surgeon: Sanjuana Kava;  Location: [facility];  Service: Orthopedics;  Laterality: Left;   SP ARTHRO THUMB*R*  05/1989   [place]   TOTAL ABDOMINAL HYSTERECTOMY  1983   [place]   TUBAL LIGATION  09/1970    FAMILY HISTORY: Family History    Problem Relation Age of Onset   Diabetes Mother    Arthritis Mother    Heart attack Father    Stroke Father    Emphysema Father    Cataracts Father    Cancer Sister    Heart disease Sister    Arthritis Sister    Arthritis Brother    Allergies Brother    Colon cancer Neg Hx     SOCIAL HISTORY: Social History   Socioeconomic History   Marital status: Widowed    Spouse name: Not on file   Number of children: 6   Years of education: college   Highest education level: Not on file  Occupational History   Occupation: [employer]    Comment: Brewing technologist strain: Not on file   Food insecurity    Worry: Not on file    Inability: Not on file   Transportation needs    Medical: Not on file    Non-medical: Not on file  Tobacco Use   Smoking status: Never Smoker   Smokeless tobacco: Never Used  Substance and Sexual Activity   Alcohol use: No    Alcohol/week: 1.0 standard drinks    Types: 1 Glasses of wine per week    Comment: rarely   Drug use: No   Sexual activity: Never  Lifestyle   Physical activity    Days per week: Not on file    Minutes per session: Not on file   Stress: Not on file  Relationships   Social connections    Talks on phone: Not on file    Gets together: Not on file    Attends religious service: Not on file    Active member of club or organization: Not on file    Attends meetings of clubs or organizations: Not on file    Relationship status: Not on file   Intimate partner violence    Fear of current or ex partner: Not on file    Emotionally abused: Not on file    Physically abused: Not on file    Forced sexual  activity: Not on file  Other Topics Concern   Not on file  Social History Narrative   .Retired Education officer, museum.    Patient has a Best boy.   Right handed.   Caffeine. None   Patient lives at home with her sister Bartolo Darter)            PHYSICAL EXAM  There were  no vitals filed for this visit. There is no height or weight on file to calculate BMI.  Generalized: Well developed, in no acute distress  Head: normocephalic and atraumatic,. Oropharynx benign  Neck: Supple,  Musculoskeletal: No deformity   Neurological examination   Mentation: Alert provides some of history, and relies on grandson.  Follows commands speech and language fluent. MMSE not done  Cranial nerve II-XII: Pupils were equal round reactive to light extraocular movements were full, visual field were full on confrontational test. Facial sensation and strength were normal. hearing was intact to finger rubbing bilaterally. Uvula tongue midline. head turning and shoulder shrug were normal and symmetric.Tongue protrusion into cheek strength was normal. Motor: normal bulk and tone, full strength in the BUE, 4/5 BLE,  Sensory: decreased to light touch, pinprick, and  Vibration, to about ankles Coordination: finger-nose-finger,  no dysmetria Reflexes: Symmetric upper and lower, plantar responses were flexor bilaterally. Gait and Station: in wheelchair today and not ambulated due to safety concerns DIAGNOSTIC DATA (LABS, IMAGING, TESTING) - I reviewed patient records, labs, notes, testing and imaging myself where available.  Lab Results  Component Value Date   WBC 9.6 05/06/2018   HGB 12.4 05/06/2018   HCT 40.5 05/06/2018   MCV 91.4 05/06/2018   PLT 335 05/06/2018      Component Value Date/Time   NA 134 (L) 05/06/2018 1455   K 4.7 05/06/2018 1455   CL 102 05/06/2018 1455   CO2 27 05/06/2018 1455   GLUCOSE 143 (H) 05/06/2018 1455   BUN 30 (H) 05/06/2018 1455   CREATININE 1.05 (H) 05/06/2018 1455   CALCIUM 8.8 (L) 05/06/2018 1455   PROT 8.4 (H) 01/13/2016 0758   ALBUMIN 3.0 (L) 01/13/2016 0758   AST 15 01/13/2016 0758   ALT 14 01/13/2016 0758   ALKPHOS 103 01/13/2016 0758   BILITOT 0.4 01/13/2016 0758   GFRNONAA 47 (L) 05/06/2018 1455   GFRAA 55 (L) 05/06/2018 1455      ASSESSMENT AND PLAN 85  years old Caucasian female with complex partial seizure disorder, and  memory loss episodes of confusion. MRI of the brain has demonstrates diffuse atrophy, moderate small vessel disease,   also one tiny acute stroke involving corpus callosum, which would not explain her current symptoms, her confusion episode has much improved with Keppra 500 twice a day, EEG was normal,        PLAN:  Continue Keppra 500mg  twice daily will refill Continue Celexa 20 daily just refilled for 1 year Use cane or walker at all times for safe ambulation , Recommend some in-home help physical therapy for strengthening exercises so the patient has better endurance with walking  F/U in 8 months  I spent 25 min in total face to face time with the patient /grandson more than 50% of which was spent counseling and coordination of care, reviewing test results reviewing medications and discussing and reviewing the diagnosis of seizure disorder and memory loss and gait disorder and benefit of PT followed by HEP   Frann Rider, AGNP-BC  [facility] [address] [place], [place] [address]  Phone  [phone] Fax [phone] Note: This document was prepared with digital dictation and possible smart phrase technology. Any transcriptional errors that result from this process are unintentional.

## 2019-04-15 ENCOUNTER — Ambulatory Visit: Payer: Medicare Other | Admitting: Adult Health

## 2019-06-01 ENCOUNTER — Other Ambulatory Visit: Payer: Self-pay | Admitting: Neurology

## 2019-07-08 ENCOUNTER — Other Ambulatory Visit: Payer: Self-pay | Admitting: Cardiovascular Disease

## 2019-07-08 ENCOUNTER — Other Ambulatory Visit: Payer: Self-pay | Admitting: Neurology

## 2019-07-13 NOTE — Progress Notes (Deleted)
[facility]  PATIENT: Katie Ford DOB: 27-Mar-1932   REASON FOR VISIT: Follow-up for seizure disorder and memory loss  HISTORY FROM: patient and grandson Katie Ford    HISTORY OF PRESENT ILLNESS:Katie Ford is a 85  years old right-handed Caucasian female, accompanied by her sister, four daughters, referred by emergency room, and her primary care physician Dr. Deirdre Pippins for evaluation of recent onset confusion spells  She had a past medical history of anxiety, mitral valve regurgitation, coronary artery disease,cardiologist is Dr. Ron Parker, she is on effient, digoxin treatment.  She is a retired Careers information officer, remained to be physically active, including swimming regularly, she did has mild gait difficulty after her left hip fracture, and fixation surgery in  2012, she lives with her sister, prior her most recent hospital presentation in July 11 2013,she is very active, balance her checkbook,drive long distance without difficulty.  In July 11 2013, she was noticed to have difficulty sorted out her medications, got very frustrated, she complains of not feeling well, went to bed early that night, woke up around 1 AM in the morning, confused, she went to her sister's bedroom, on asking her where she is, later she went to bed, was noted by her family December 21st increased confusion, did not know where she is, she was taken to hospital, CAT scan of the brain showed previous old stroke in the left basal ganglion, chronic sinusitis, small vessel disease, no acute stroke,  Ultrasound of carotid arteries showed less than 50% stenosis of bilateral internal carotid artery  Bilateral lower extremity venous Doppler showed no DVT in bilateral lower extremity,  Bilateral hip X-ray demonstrated previous left hip surgery, no evidence of acute process,  She was treated with antibiotics Keflex for sinusitis, over the past 2 weeks, she has improved, but not back to her baseline,  she continued to have intermittent episodes of confusion, eye blinking spells, lasting for a few minutes, followed by extreme fatigue, tends to sleep afterwards  I have reviewed MRI together with patient and her family, MRI of the brain has demonstrated positive diffusion imaging shows a 5 mm focus of acute infarction in the midline body of the corpus callosum. No other acute infarction.  There chronic small-vessel changes affecting the pons.  There are moderate changes of chronic small vessel disease affecting the deep and subcortical cerebral hemispheric white matter. There was diffuse atrophy  She denies a previous history of seizure  UPDATE Sep 02, 2013:YY MRA of the brain was normal.    She fell, was taken to the emergency room, had MRI of pelvic showed no acute findings related to recent fall. Moderate right gluteus minimus tendinosis with adjacent bursal fluid. No tendon tear demonstrated. Left pelvic muscular atrophy related to remote left proximal femoral ORIF.  Lower lumbar spondylosis with chronic resulting foraminal  Stenosis.  MRI cervical in December 2014 Spondylosis at C5-6 contributes to mild central and moderate biforaminal stenosis.  Sometimes, urinary urgency since 2012, no change.   She was started on Keppra, 500mg  bid, she feels more alert, is also confirmed by her sister,  no more confusion episodes, no more spells  UPDATE Sep 02 2014:YY She is now taking Keppra 500mg  bid, no more seizure like event, no side effect, still swimming.  She is driving some, word finding difficulty, short memory trouble, she lives with her sister,   UPDATE Sep 11 2015:YY She still swim, her low lumbar spine compression fracture  I have personally reviewed MRI lumbar in  Dec 2016: Severe degenerative lumbar spondylosis with multilevel disc disease and facet disease, severe multifactorial spinal stenosis and bilateral recess stenosis at L3-4, L4-5, broad-based left foraminal  extraforaminal disc protrusion at L4-5 with significant left foraminal stenosis, Stable lower thoracic degenerative disc disease. Persistent spinal stenosis and mild malacia involving the lower thoracic cord at T11-12.  She has significant gait difficulty, low back pain, no recurrent seizure, tolerating Keppra 500 mg twice a day, continue has mild memory trouble  UPDATE Sep 11 2016:YY She is accompanied by her daughter and grandson at today's clinical visit, she had no recurrent seizure, but she had slight worsening memory loss, also becoming easily agitated, throw hot pan at her family members, chronic low back pain, hip pain, mild gait abnormality. UPDATE 08/21/2018CM Katie Ford, 85 year old female returns for follow-up with history of seizure disorder and memory loss. Her memory has continued to decline. She has not had further seizure activity. She was placed on Seroquel and Remeron at her last visit with Dr. Krista Blue. Those medications were not started. She had a daughter die recently at the age of 8 unexpectedly. She continues to have low back pain and gait abnormality.No recent falls. Ambulates with a quad cane. Family members assist patient in her home.  UPDATE 4/2/2019CM Katie Ford, 85 year old female returns for follow-up with history of seizure disorder and memory loss.  Her memory has continued to decline at last visit and she was placed on Aricept but her son called in and stated she was having diarrhea on the drug so it was stopped.  She has been tried on Seroquel and Remeron in the past by Dr. Krista Blue but never started the medication she has significant back pain and gait abnormality.  She is seated in a wheelchair today with stooped posture.  She is with her grandson Katie Ford with whom she lives.  Appetite is good and she denies any difficulty sleeping at night she has had several falls in the last 6 months she has not had any injury.  She continues to ambulate with a quad cane or rolling walker.   Anytime she tries to get up without assistive device and that is why she falls.  She returns for reevaluation REVIEW OF SYSTEMS: Full 14 system review of systems performed and notable only for those listed, all others are neg:  Constitutional: Fatigue   Cardiovascular: neg Ear/Nose/Throat: neg  Skin: neg Eyes: Blurred vision Respiratory: Chronic cough Gastroitestinal: neg  Hematology/Lymphatic: neg  Endocrine: neg Musculoskeletal:chronic back pain, gait disorder Allergy/Immunology: neg Neurological: memory loss, seizure disorder Psychiatric: neg Sleep : neg   ALLERGIES: Allergies  Allergen Reactions  . Aricept [Donepezil Hcl]     All side effects  . Celecoxib Other (See Comments)    TACHYCARDIA  . Codeine Other (See Comments)    TACHYCARIDIA  . Hydrocodone Other (See Comments)    TACHYCARDIA  . Tartrazine Other (See Comments)    Causes Tachycardia  . Aspirin     ONLY TARTRAZINE!!! Causes Tachycardia  . Influenza Vaccines Other (See Comments)    HOME MEDICATIONS: Outpatient Medications Prior to Visit  Medication Sig Dispense Refill  . albuterol (PROVENTIL) (2.5 MG/3ML) 0.083% nebulizer solution Take 2.5 mg by nebulization daily as needed for wheezing or shortness of breath.     . ALPRAZolam (XANAX) 0.5 MG tablet Take 0.25-0.5 mg by mouth daily as needed for anxiety or sleep. TAKE HALF TO ONE  A TABLET IF NEEDED FOR ANXIETY    . amLODipine (NORVASC) 10 MG tablet  Take 10 mg by mouth every morning.     Marland Kitchen BREO ELLIPTA 100-25 MCG/INH AEPB Inhale 1 puff into the lungs daily.    . cetirizine (ZYRTEC) 10 MG tablet Take 5 mg by mouth daily.     . citalopram (CELEXA) 10 MG tablet TAKE 2 TABLETS EVERY DAY 60 tablet 1  . esomeprazole (NEXIUM) 40 MG capsule TAKE 1 CAPSULE (40 MG TOTAL)  BY MOUTH DAILY BEFORE BREAKFAST. (Patient taking differently: Take 40 mg by mouth every morning. ) 30 capsule 5  . fluticasone (FLONASE) 50 MCG/ACT nasal spray Place 1 spray into both nostrils daily as  needed for allergies.     . furosemide (LASIX) 40 MG tablet TAKE 1 TABLET DAILY 90 tablet 0  . levETIRAcetam (KEPPRA) 500 MG tablet Take 1 tablet (500 mg total) by mouth 2 (two) times daily. Please call [phone] to schedule follow up appt. 180 tablet 0  . levothyroxine (SYNTHROID, LEVOTHROID) 88 MCG tablet Take 88 mcg by mouth every morning.    Marland Kitchen losartan-hydrochlorothiazide (HYZAAR) 50-12.5 MG tablet Take 1 tablet by mouth daily.     . metoprolol succinate (TOPROL-XL) 100 MG 24 hr tablet Take 100 mg by mouth 2 (two) times daily. Take with or immediately following a meal.     . nitroGLYCERIN (NITROSTAT) 0.4 MG SL tablet Place 1 tablet (0.4 mg total) under the tongue every 5 (five) minutes as needed for chest pain. 25 tablet 3  . polyethylene glycol (MIRALAX / GLYCOLAX) packet Take 17 g by mouth daily as needed for mild constipation.     . Polyvinyl Alcohol-Povidone (MURINE TEARS FOR DRY EYES) 5-6 MG/ML SOLN Place 1 drop into both eyes every morning. As needed    . potassium chloride SA (K-DUR) 20 MEQ tablet TAKE 2 TABLETS DAILY 180 tablet 0  . prasugrel (EFFIENT) 5 MG TABS tablet Take 1 tablet (5 mg total) by mouth daily. 30 tablet 6  . traMADol (ULTRAM) 50 MG tablet Take 1 tablet (50 mg total) by mouth every 12 (twelve) hours as needed. for pain 60 tablet 0   No facility-administered medications prior to visit.    PAST MEDICAL HISTORY: Past Medical History:  Diagnosis Date  . Asthmatic bronchitis   . CAD (coronary artery disease)    bare metal stents to 2 separate RCA lesions August, 2011, residual 60% LAD, medical therapy  /  plan was for effient for one month followed by Plavix,, but patient does not tolerate Plavix... therefore patient placed back on effient  . Carotid artery disease ([place])   . Chronic back pain   . Chronic hip pain   . Dementia ([place])   . Dementia ([place])   . Diabetes mellitus    does not have  . Diverticulitis    remote hx  . Edema 03/29/2010   September, 2011  .  Ejection fraction    EF 65 %... normal... catheterization 02/2010.  . Fracture of rib of left side 04/24/2011  . Gait abnormality   . GERD (gastroesophageal reflux disease)   . Hip fracture [facility])    October, 2012  . Hyperglycemia 04/24/2011  . Hypertension   . Hypothyroidism 04/24/2011  . Intolerance of drug    aspirin and plavix  . Mitral regurgitation    mild, echo, June, 2007  . Obesity   . Paroxysmal supraventricular tachycardia (H[facility])    infrequent episodes over the years..palpitations.  . Sciatica     PAST SURGICAL HISTORY: Past Surgical History:  Procedure Laterality Date  .  ANKLE FRACTURE SURGERY  12/1993   [place]  . ARTHROPLASTY W/ ARTHROSCOPY MEDIAL / LATERAL COMPARTMENT KNEE     [facility], should read arthroscopy not arthroplasty  . CARDIAC CATHETERIZATION  02/22/2010   2 stents, [place]  . ESOPHAGOGASTRODUODENOSCOPY  12/06/11   fields: Negative H. pylori, hiatal hernia/moderate gastritis  . ileocolonscopy  12/06/11   fields:diverticula throughtout the colon/inertnal hemorrhoids/  . KNEE ARTHROSCOPY  11/2001   left knee  . KNEE ARTHROSCOPY  02/19/2011   Procedure: ARTHROSCOPY KNEE;  Surgeon: Sanjuana Kava;  Location: [facility];  Service: Orthopedics;  Laterality: Right;  Medial and Lateral Partial Menisectomy  . LAPAROSCOPIC CHOLECYSTECTOMY  04/29/1996   [facility] [facility]  . ORIF HIP FRACTURE  04/25/2011   Procedure: OPEN REDUCTION INTERNAL FIXATION HIP;  Surgeon: Sanjuana Kava;  Location: [facility];  Service: Orthopedics;  Laterality: Left;  . SP ARTHRO THUMB*R*  05/1989   [place]  . TOTAL ABDOMINAL HYSTERECTOMY  1983   [place]  . TUBAL LIGATION  09/1970    FAMILY HISTORY: Family History  Problem Relation Age of Onset  . Diabetes Mother   . Arthritis Mother   . Heart attack Father   . Stroke Father   . Emphysema Father   . Cataracts Father   . Cancer Sister   . Heart disease Sister   . Arthritis Sister   . Arthritis Brother   . Allergies Brother   . Colon cancer Neg Hx     SOCIAL  HISTORY: Social History   Socioeconomic History  . Marital status: Widowed    Spouse name: Not on file  . Number of children: 6  . Years of education: college  . Highest education level: Not on file  Occupational History  . Occupation: Re[employer]    CommentAdvertising copywriter  Tobacco Use  . Smoking status: Never Smoker  . Smokeless tobacco: Never Used  Substance and Sexual Activity  . Alcohol use: No    Alcohol/week: 1.0 standard drinks    Types: 1 Glasses of wine per week    Comment: rarely  . Drug use: No  . Sexual activity: Never  Other Topics Concern  . Not on file  Social History Narrative   .Retired Education officer, museum.    Patient has a Best boy.   Right handed.   Caffeine. None   Patient lives at home with her sister Bartolo Darter)          Social Determinants of Health   Financial Resource Strain:   . Difficulty of Paying Living Expenses: Not on file  Food Insecurity:   . Worried About Charity fundraiser in the Last Year: Not on file  . Ran Out of Food in the Last Year: Not on file  Transportation Needs:   . Lack of Transportation (Medical): Not on file  . Lack of Transportation (Non-Medical): Not on file  Physical Activity:   . Days of Exercise per Week: Not on file  . Minutes of Exercise per Session: Not on file  Stress:   . Feeling of Stress : Not on file  Social Connections:   . Frequency of Communication with Friends and Family: Not on file  . Frequency of Social Gatherings with Friends and Family: Not on file  . Attends Religious Services: Not on file  . Active Member of Clubs or Organizations: Not on file  . Attends Archivist Meetings: Not on file  . Marital Status: Not on file  Intimate Partner Violence:   . Fear of  Current or Ex-Partner: Not on file  . Emotionally Abused: Not on file  . Physically Abused: Not on file  . Sexually Abused: Not on file     PHYSICAL EXAM  There were no vitals filed for this visit. There is no height  or weight on file to calculate BMI.  Generalized: Well developed, in no acute distress  Head: normocephalic and atraumatic,. Oropharynx benign  Neck: Supple,  Musculoskeletal: No deformity   Neurological examination   Mentation: Alert provides some of history, and relies on grandson.  Follows commands speech and language fluent. MMSE not done  Cranial nerve II-XII: Pupils were equal round reactive to light extraocular movements were full, visual field were full on confrontational test. Facial sensation and strength were normal. hearing was intact to finger rubbing bilaterally. Uvula tongue midline. head turning and shoulder shrug were normal and symmetric.Tongue protrusion into cheek strength was normal. Motor: normal bulk and tone, full strength in the BUE, 4/5 BLE,  Sensory: decreased to light touch, pinprick, and  Vibration, to about ankles Coordination: finger-nose-finger,  no dysmetria Reflexes: Symmetric upper and lower, plantar responses were flexor bilaterally. Gait and Station: in wheelchair today and not ambulated due to safety concerns DIAGNOSTIC DATA (LABS, IMAGING, TESTING) - I reviewed patient records, labs, notes, testing and imaging myself where available.  Lab Results  Component Value Date   WBC 9.6 05/06/2018   HGB 12.4 05/06/2018   HCT 40.5 05/06/2018   MCV 91.4 05/06/2018   PLT 335 05/06/2018      Component Value Date/Time   NA 134 (L) 05/06/2018 1455   K 4.7 05/06/2018 1455   CL 102 05/06/2018 1455   CO2 27 05/06/2018 1455   GLUCOSE 143 (H) 05/06/2018 1455   BUN 30 (H) 05/06/2018 1455   CREATININE 1.05 (H) 05/06/2018 1455   CALCIUM 8.8 (L) 05/06/2018 1455   PROT 8.4 (H) 01/13/2016 0758   ALBUMIN 3.0 (L) 01/13/2016 0758   AST 15 01/13/2016 0758   ALT 14 01/13/2016 0758   ALKPHOS 103 01/13/2016 0758   BILITOT 0.4 01/13/2016 0758   GFRNONAA 47 (L) 05/06/2018 1455   GFRAA 55 (L) 05/06/2018 1455     ASSESSMENT AND PLAN 85  years old Caucasian female  with complex partial seizure disorder, and  memory loss episodes of confusion. MRI of the brain has demonstrates diffuse atrophy, moderate small vessel disease,   also one tiny acute stroke involving corpus callosum, which would not explain her current symptoms, her confusion episode has much improved with Keppra 500 twice a day, EEG was normal,        PLAN: Continue Keppra 500mg  twice daily will refill Continue Celexa 20 daily just refilled for 1 year Use cane or walker at all times for safe ambulation , Recommend some in-home help physical therapy for strengthening exercises so the patient has better endurance with walking  F/U in 8 months I spent 25 min in total face to face time with the patient /grandson more than 50% of which was spent counseling and coordination of care, reviewing test results reviewing medications and discussing and reviewing the diagnosis of seizure disorder and memory loss and gait disorder and benefit of PT followed by HEP  Answered multiple questions  Dennie Bible, [facility], [facility], APRN  [facility] [address] [place], [place] [address] [phone]

## 2019-07-19 ENCOUNTER — Encounter: Payer: Self-pay | Admitting: Neurology

## 2019-07-19 ENCOUNTER — Telehealth (INDEPENDENT_AMBULATORY_CARE_PROVIDER_SITE_OTHER): Payer: Medicare Other | Admitting: Neurology

## 2019-07-19 ENCOUNTER — Telehealth: Payer: Medicare Other | Admitting: Adult Health

## 2019-07-19 DIAGNOSIS — G40209 Localization-related (focal) (partial) symptomatic epilepsy and epileptic syndromes with complex partial seizures, not intractable, without status epilepticus: Secondary | ICD-10-CM | POA: Diagnosis not present

## 2019-07-19 MED ORDER — LEVETIRACETAM 500 MG PO TABS: 500.0000 mg | ORAL_TABLET | Freq: Two times a day (BID) | ORAL | 3 refills | Status: AC

## 2019-07-19 MED ORDER — CITALOPRAM HYDROBROMIDE 10 MG PO TABS: 20.0000 mg | ORAL_TABLET | Freq: Every day | ORAL | 3 refills | Status: AC
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
## 2019-08-02 ENCOUNTER — Other Ambulatory Visit: Payer: Self-pay | Admitting: Student

## 2019-08-19 ENCOUNTER — Encounter (HOSPITAL_BASED_OUTPATIENT_CLINIC_OR_DEPARTMENT_OTHER): Payer: Medicare PPO | Attending: Internal Medicine | Admitting: Internal Medicine

## 2019-08-19 ENCOUNTER — Other Ambulatory Visit: Payer: Self-pay

## 2019-08-19 DIAGNOSIS — J45909 Unspecified asthma, uncomplicated: Secondary | ICD-10-CM | POA: Diagnosis not present

## 2019-08-19 DIAGNOSIS — I2510 Atherosclerotic heart disease of native coronary artery without angina pectoris: Secondary | ICD-10-CM | POA: Insufficient documentation

## 2019-08-19 DIAGNOSIS — Z87891 Personal history of nicotine dependence: Secondary | ICD-10-CM | POA: Diagnosis not present

## 2019-08-19 DIAGNOSIS — M868X8 Other osteomyelitis, other site: Secondary | ICD-10-CM | POA: Diagnosis not present

## 2019-08-19 DIAGNOSIS — I252 Old myocardial infarction: Secondary | ICD-10-CM | POA: Diagnosis not present

## 2019-08-19 DIAGNOSIS — E860 Dehydration: Secondary | ICD-10-CM | POA: Diagnosis not present

## 2019-08-19 DIAGNOSIS — K219 Gastro-esophageal reflux disease without esophagitis: Secondary | ICD-10-CM | POA: Diagnosis not present

## 2019-08-19 DIAGNOSIS — M069 Rheumatoid arthritis, unspecified: Secondary | ICD-10-CM | POA: Insufficient documentation

## 2019-08-19 DIAGNOSIS — J449 Chronic obstructive pulmonary disease, unspecified: Secondary | ICD-10-CM | POA: Insufficient documentation

## 2019-08-19 DIAGNOSIS — L89224 Pressure ulcer of left hip, stage 4: Secondary | ICD-10-CM | POA: Diagnosis present

## 2019-08-19 DIAGNOSIS — D649 Anemia, unspecified: Secondary | ICD-10-CM | POA: Insufficient documentation

## 2019-08-19 DIAGNOSIS — E039 Hypothyroidism, unspecified: Secondary | ICD-10-CM | POA: Insufficient documentation

## 2019-08-19 DIAGNOSIS — E785 Hyperlipidemia, unspecified: Secondary | ICD-10-CM | POA: Insufficient documentation

## 2019-08-19 DIAGNOSIS — G40909 Epilepsy, unspecified, not intractable, without status epilepticus: Secondary | ICD-10-CM | POA: Diagnosis not present

## 2019-08-19 DIAGNOSIS — F0390 Unspecified dementia without behavioral disturbance: Secondary | ICD-10-CM | POA: Diagnosis not present

## 2019-08-19 DIAGNOSIS — I10 Essential (primary) hypertension: Secondary | ICD-10-CM | POA: Insufficient documentation

## 2019-08-19 DIAGNOSIS — Z955 Presence of coronary angioplasty implant and graft: Secondary | ICD-10-CM | POA: Diagnosis not present

## 2019-08-19 DIAGNOSIS — Z8673 Personal history of transient ischemic attack (TIA), and cerebral infarction without residual deficits: Secondary | ICD-10-CM | POA: Insufficient documentation

## 2019-08-19 NOTE — Progress Notes (Addendum)
Katie Ford, Katie Ford ([id number]) Visit Report for 08/19/2019 Allergy List Details Patient Name: Date of Service: Katie Ford, Katie Ford 08/19/2019 2:45 PM Medical Record [id number] Patient Account Number: [id number] Date of Birth/Sex: Treating RN: 11/08/1931 (86 y.o. Female) Kela Millin Primary Care Anna-Marie Coller: Margarita Rana Other Clinician: Referring Makinzi Prieur: Treating Neera Teng/Extender:Robson, Stefan Church, Elon Alas in Treatment: 0 Allergies Active Allergies Aricept celecoxib Reaction: tachycardia codeine Reaction: tachycardia FD and C no.5 (tartrazine) Reaction: tachycardia aspirin Reaction: tachycardia when given with tartrazine Allergy Notes Electronic Signature(s) Signed: 08/19/2019 5:20:11 PM By: Kela Millin Entered By: Kela Millin on 08/19/2019 15:03:28 -------------------------------------------------------------------------------- Arrival Information Details Patient Name: Date of Service: Katie Ford 08/19/2019 2:45 PM Medical Record [id number] Patient Account Number: [id number] Date of Birth/Sex: Treating RN: 1932/01/26 (86 y.o. Female) Kela Millin Primary Care Aqeel Norgaard: Margarita Rana Other Clinician: Referring Tyrika Newman: Treating Lavayah Vita/Extender:Robson, Stefan Church, Elon Alas in Treatment: 0 Visit Information Patient Arrived: Wheel Chair Arrival Time: 14:58 Accompanied By: grandson Transfer Assistance: Manual Patient Identification Verified: Yes Secondary Verification Process Yes Completed: Patient Requires Transmission- No Based Precautions: Patient Has Alerts: Yes Patient Alerts: Patient on Blood Thinner Electronic Signature(s) Signed: 08/19/2019 5:20:11 PM By: Kela Millin Entered By: Kela Millin on 08/19/2019 14:58:57 -------------------------------------------------------------------------------- Clinic Level of Care Assessment Details Patient Name: Date of  Service: Katie Ford, Katie Ford 08/19/2019 2:45 PM Medical Record [id number] Patient Account Number: [id number] Date of Birth/Sex: Treating RN: 1931-12-24 (86 y.o. Female) Deon Pilling Primary Care Jo-Anne Kluth: Margarita Rana Other Clinician: Referring Nadalyn Deringer: Treating Hilary Milks/Extender:Robson, Stefan Church, Elon Alas in Treatment: 0 Clinic Level of Care Assessment Items TOOL 2 Quantity Score X - Use when only an EandM is performed on the INITIAL visit 1 0 ASSESSMENTS - Nursing Assessment / Reassessment X - General Physical Exam (combine w/ comprehensive assessment (listed just below) 1 20 when performed on new pt. evals) X - Comprehensive Assessment (HX, ROS, Risk Assessments, Wounds Hx, etc.) 1 25 ASSESSMENTS - Wound and Skin Assessment / Reassessment []  - Simple Wound Assessment / Reassessment - one wound 0 X - Complex Wound Assessment / Reassessment - multiple wounds 2 5 X - Dermatologic / Skin Assessment (not related to wound area) 1 10 ASSESSMENTS - Ostomy and/or Continence Assessment and Care []  - Incontinence Assessment and Management 0 []  - Ostomy Care Assessment and Management (repouching, etc.) 0 PROCESS - Coordination of Care []  - Simple Patient / Family Education for ongoing care 0 X - Complex (extensive) Patient / Family Education for ongoing care 1 20 X - Staff obtains Programmer, systems, Records, Test Results / Process Orders 1 10 X - Staff telephones HHA, Nursing Homes / Clarify orders / etc 1 10 []  - Routine Transfer to another Facility (non-emergent condition) 0 []  - Routine Hospital Admission (non-emergent condition) 0 X - New Admissions / Biomedical engineer / Ordering NPWT, Apligraf, etc. 1 15 []  - Emergency Hospital Admission (emergent condition) 0 []  - Simple Discharge Coordination 0 X - Complex (extensive) Discharge Coordination 1 15 PROCESS - Special Needs []  - Pediatric / Minor Patient Management 0 []  - Isolation Patient Management 0 []  -  Hearing / Language / Visual special needs 0 X - Assessment of Community assistance (transportation, D/C planning, etc.) 1 15 []  - Additional assistance / Altered mentation 0 []  - Support Surface(s) Assessment (bed, cushion, seat, etc.) 0 INTERVENTIONS - Wound Cleansing / Measurement X - Wound Imaging (photographs - any number of wounds) 1 5 []  - Wound Tracing (instead of photographs) 0 []  - Simple Wound  Measurement - one wound 0 X - Complex Wound Measurement - multiple wounds 2 5 []  - Simple Wound Cleansing - one wound 0 X - Complex Wound Cleansing - multiple wounds 2 5 INTERVENTIONS - Wound Dressings X - Small Wound Dressing one or multiple wounds 1 10 []  - Medium Wound Dressing one or multiple wounds 0 X - Large Wound Dressing one or multiple wounds 1 20 []  - Application of Medications - injection 0 INTERVENTIONS - Miscellaneous []  - External ear exam 0 []  - Specimen Collection (cultures, biopsies, blood, body fluids, etc.) 0 []  - Specimen(s) / Culture(s) sent or taken to Lab for analysis 0 []  - Patient Transfer (multiple staff / Harrel Lemon Lift / Similar devices) 0 []  - Simple Staple / Suture removal (25 or less) 0 []  - Complex Staple / Suture removal (26 or more) 0 []  - Hypo / Hyperglycemic Management (close monitor of Blood Glucose) 0 []  - Ankle / Brachial Index (ABI) - do not check if billed separately 0 Has the patient been seen at the hospital within the last three years: Yes Total Score: 205 Level Of Care: New/Established - Level 5 Electronic Signature(s) Signed: 08/19/2019 6:05:55 PM By: Deon Pilling Entered By: Deon Pilling on 08/19/2019 17:35:21 -------------------------------------------------------------------------------- Encounter Discharge Information Details Patient Name: Date of Service: Katie Ford 08/19/2019 2:45 PM Medical Record [id number] Patient Account Number: [id number] Date of Birth/Sex: Treating RN: 05-11-1932 (86 y.o. Female) Deon Pilling Primary Care Deaja Rizo: Margarita Rana Other Clinician: Referring Jarmal Lewelling: Treating Caius Silbernagel/Extender:Robson, Stefan Church, Elon Alas in Treatment: 0 Encounter Discharge Information Items Discharge Condition: Stable Ambulatory Status: Wheelchair Discharge Destination: Home Transportation: Private Auto Accompanied By: grandson Schedule Follow-up Appointment: No Clinical Summary of Care: Notes Phoned [facility] and spoke with [place]. Per Lanae Boast to fax over clinical notes and would get patient admitted to hospice tomorrow. MD and grandson made aware. Electronic Signature(s) Signed: 08/19/2019 6:05:55 PM By: Deon Pilling Entered By: Deon Pilling on 08/19/2019 17:37:36 -------------------------------------------------------------------------------- Lower Extremity Assessment Details Patient Name: Date of Service: Katie Ford, Katie Ford 08/19/2019 2:45 PM Medical Record [id number] Patient Account Number: [id number] Date of Birth/Sex: Treating RN: 1932-06-02 (86 y.o. Female) Kela Millin Primary Care Shakila Mak: Margarita Rana Other Clinician: Referring Sona Nations: Treating Savita Runner/Extender:Robson, Stefan Church, Elon Alas in Treatment: 0 Electronic Signature(s) Signed: 08/19/2019 5:20:11 PM By: Kela Millin Entered By: Kela Millin on 08/19/2019 15:12:43 -------------------------------------------------------------------------------- Multi Wound Chart Details Patient Name: Date of Service: Katie Ford, Katie Ford 08/19/2019 2:45 PM Medical Record [id number] Patient Account Number: [id number] Date of Birth/Sex: Treating RN: 1932/06/11 (86 y.o. Female) Primary Care Margaurite Salido: Margarita Rana Other Clinician: Referring Milica Gully: Treating Christabell Loseke/Extender:Robson, Stefan Church, Elon Alas in Treatment: 0 Vital Signs Height(in): 84 Pulse(bpm): 84 Weight(lbs): 110 Blood Pressure(mmHg): 36/47 Body Mass Index(BMI):  18 Temperature(F): 98 Respiratory 20 Rate(breaths/min): Photos: [1:No Photos] [2:No Photos] [N/A:N/A] Wound Location: [1:Left Ear] [2:Left Trochanter] [N/A:N/A] Wounding Event: [1:Gradually Appeared] [2:Gradually Appeared] [N/A:N/A] Primary Etiology: [1:Pressure Ulcer] [2:Pressure Ulcer] [N/A:N/A] Comorbid History: [1:Cataracts, Anemia, Asthma, Cataracts, Anemia, Asthma, N/A Arrhythmia, Coronary Artery Arrhythmia, Coronary Artery Disease, Hypertension, Myocardial Infarction, Rheumatoid Arthritis, Dementia] [2:Disease, Hypertension, Myocardial  Infarction, Rheumatoid Arthritis, Dementia] Date Acquired: [1:08/16/2019] [2:08/05/2019] [N/A:N/A] Weeks of Treatment: [1:0] [2:0] [N/A:N/A] Wound Status: [1:Open] [2:Open] [N/A:N/A] Measurements L x W x D 1.3x0.7x0.1 [2:15.4x10x3.7] [N/A:N/A] (cm) Area (cm) : [1:0.715] [2:120.951] [N/A:N/A] Volume (cm) : [1:0.071] [2:447.52] [N/A:N/A] Starting Position 1 [2:3] (o'clock): Ending Position 1 [2:8] (o'clock): Maximum Distance 1 [2:4.7] (cm): Undermining: [1:No] [2:Yes] [N/A:N/A] Classification: [1:Unstageable/Unclassified] [2:Category/Stage  IV] [N/A:N/A] Exudate Amount: [1:None Present] [2:Large] [N/A:N/A] Exudate Type: [1:N/A] [2:Purulent] [N/A:N/A] Exudate Color: [1:N/A] [2:yellow, brown, green] [N/A:N/A] Foul Odor After Cleansing:No [2:Yes] [N/A:N/A] Odor Anticipated Due to N/A [2:No] [N/A:N/A] Product Use: Wound Margin: [1:Distinct, outline attached] [2:Well defined, not attached] [N/A:N/A] Granulation Amount: [1:None Present (0%)] [2:Medium (34-66%)] [N/A:N/A] Granulation Quality: [1:N/A] [2:Pink] [N/A:N/A] Necrotic Amount: [1:Large (67-100%)] [2:Medium (34-66%)] [N/A:N/A] Necrotic Tissue: [1:Eschar, Adherent Slough] [2:Adherent Slough] [N/A:N/A] Exposed Structures: [1:Fascia: No Fat Layer (Subcutaneous Tissue) Exposed: No Tendon: No Muscle: No Joint: No Bone: No None] [2:Fascia: Yes Fat Layer (Subcutaneous Tissue) Exposed: Yes Muscle:  Yes Bone: Yes Tendon: No Joint: No None] [N/A:N/A N/A] Treatment Notes Electronic Signature(s) Signed: 08/19/2019 5:54:40 PM By: Linton Ham MD Entered By: Linton Ham on 08/19/2019 16:23:42 -------------------------------------------------------------------------------- Multi-Disciplinary Care Plan Details Patient Name: Date of Service: Katie Ford 08/19/2019 2:45 PM Medical Record [id number] Patient Account Number: [id number] Date of Birth/Sex: Treating RN: 01-10-1932 (86 y.o. Female) Deon Pilling Primary Care Fleeta Kunde: Margarita Rana Other Clinician: Referring Makael Stein: Treating Remedy Corporan/Extender:Robson, Stefan Church, Elon Alas in Treatment: 0 Active Inactive Electronic Signature(s) Signed: 08/19/2019 6:05:55 PM By: Deon Pilling Entered By: Deon Pilling on 08/19/2019 17:54:40 -------------------------------------------------------------------------------- Patient/Caregiver Education Details Patient Name: Katie Ford 1/28/2021andnbsp2:45 Date of Service: PM Medical Record [id number] Number: Patient Account Number: [id number] Treating RN: Date of Birth/Gender: 08/23/1931 (87 y.o. Deon Pilling Female) Other Clinician: Primary Care Treating Nyland, Karmen Bongo Physician: Physician/Extender: Referring Physician: Phoebe Perch in Treatment: 0 Education Assessment Education Provided To: Patient Education Topics Provided Safety: Welcome To [facility]: Handouts: Welcome To [facility] Methods: Explain/Verbal, Printed Responses: Reinforcements needed Electronic Signature(s) Signed: 08/19/2019 6:05:55 PM By: Deon Pilling Entered By: Deon Pilling on 08/19/2019 16:05:34 -------------------------------------------------------------------------------- Wound Assessment Details Patient Name: Date of Service: Katie Ford, Katie Ford 08/19/2019 2:45 PM Medical Record [id number] Patient  Account Number: [id number] Date of Birth/Sex: Treating RN: February 10, 1932 (86 y.o. Female) Primary Care Dianna Deshler: Margarita Rana Other Clinician: Referring Fruma [place]: Treating Oberia Beaudoin/Extender:Robson, Stefan Church, Elon Alas in Treatment: 0 Wound Status Wound Number: 1 Primary Pressure Ulcer Etiology: Wound Location: Left Ear Wound Not Healed Wounding Event: Gradually Appeared Status: Date Acquired: 08/16/2019 Comorbid Cataracts, Anemia, Asthma, Arrhythmia, Weeks Of Treatment: 0 History: Coronary Artery Disease, Hypertension, Clustered Wound: No Myocardial Infarction, Rheumatoid Arthritis, Dementia Photos Wound Measurements Length: (cm) 1.3 % Reductio Width: (cm) 0.7 % Reductio Depth: (cm) 0.1 Epithelial Area: (cm) 0.715 Tunneling Volume: (cm) 0.071 Undermini Wound Description Classification: Unstageable/Unclassified Wound Margin: Distinct, outline attached Exudate Amount: None Present Wound Bed Granulation Amount: None Present (0%) Necrotic Amount: Large (67-100%) Necrotic Quality: Eschar, Adherent Slough Foul Odor After Cleansing: No Slough/Fibrino Yes Exposed Structure Fascia Exposed: No Fat Layer (Subcutaneous Tissue) Exposed: No Tendon Exposed: No Muscle Exposed: No Joint Exposed: No Bone Exposed: No n in Area: 0% n in Volume: 0% ization: None : No ng: No Treatment Notes Wound #1 (Left Ear) 1. Cleanse With Wound Cleanser 3. Primary Dressing Applied Calcium Alginate Ag 4. Secondary Dressing Other secondary dressing (specify in notes) Notes bandaid Electronic Signature(s) Signed: 08/20/2019 4:26:31 PM By: Mikeal Hawthorne EMT/HBOT Previous Signature: 08/19/2019 5:20:11 PM Version By: Kela Millin Entered By: Mikeal Hawthorne on 08/20/2019 14:39:48 -------------------------------------------------------------------------------- Wound Assessment Details Patient Name: Date of Service: Katie Ford, Katie Ford 08/19/2019 2:45 PM Medical Record  [id number] Patient Account Number: [id number] Date of Birth/Sex: 1932/07/10 (87 y.o. Female) Treating RN: Primary Care Markie Heffernan: Other Clinician: Margarita Rana Referring Mitzi Lilja: Treating Michaelah Credeur/Extender:Robson, Stefan Church, Hollice Espy  R Weeks in Treatment: 0 Wound Status Wound Number: 2 Primary Pressure Ulcer Etiology: Wound Location: Left Trochanter Wound Not Healed Wounding Event: Gradually Appeared Status: Date Acquired: 08/05/2019 Comorbid Cataracts, Anemia, Asthma, Arrhythmia, Weeks Of Treatment: 0 History: Coronary Artery Disease, Hypertension, Clustered Wound: No Myocardial Infarction, Rheumatoid Arthritis, Dementia Photos Wound Measurements Length: (cm) 15.4 % Reduction in Width: (cm) 10 % Reduction in Depth: (cm) 3.7 Epithelializati Area: (cm) 120.951 Tunneling: Volume: (cm) 447.52 Undermining: Starting Pos Ending Posit Maximum Dist Area: 0% Volume: 0% on: None No Yes ition (o'clock): 3 ion (o'clock): 8 ance: (cm) 4.7 Wound Description Classification: Category/Stage IV Foul Odor Afte Wound Margin: Well defined, not attached Due to Product Exudate Amount: Large Slough/Fibrino Exudate Type: Purulent Exudate Color: yellow, brown, green Wound Bed Granulation Amount: Medium (34-66%) Granulation Quality: Pink Fascia Exposed Necrotic Amount: Medium (34-66%) Fat Layer (Sub Necrotic Quality: Adherent Slough Tendon Exposed Muscle Exposed Necrosis Joint Exposed: Bone Exposed: r Cleansing: Yes Use: No Yes Exposed Structure : Yes cutaneous Tissue) Exposed: Yes : No : Yes of Muscle: No No Yes Treatment Notes Wound #2 (Left Trochanter) 1. Cleanse With Wound Cleanser 2. Periwound Care Skin Prep 3. Primary Dressing Applied Calcium Alginate Ag 4. Secondary Dressing ABD Pad 5. Secured With [employer]) Signed: 08/20/2019 4:26:31 PM By: Mikeal Hawthorne EMT/HBOT Previous Signature: 08/19/2019 5:20:11 PM Version  By: Kela Millin Entered By: Mikeal Hawthorne on 08/20/2019 14:41:07 -------------------------------------------------------------------------------- Vitals Details Patient Name: Date of Service: Katie Ford 08/19/2019 2:45 PM Medical Record [id number] Patient Account Number: [id number] Date of Birth/Sex: Treating RN: 10-14-1931 (86 y.o. Female) Kela Millin Primary Care Saleah Rishel: Margarita Rana Other Clinician: Referring Kofi Murrell: Treating Dontai Pember/Extender:Robson, Stefan Church, Elon Alas in Treatment: 0 Vital Signs Time Taken: 15:00 Temperature (F): 98 Height (in): 66 Pulse (bpm): 66 Source: Stated Respiratory Rate (breaths/min): 20 Weight (lbs): 110 Blood Pressure (mmHg): 93/47 Source: Stated Reference Range: 80 - 120 mg / dl Body Mass Index (BMI): 17.8 Electronic Signature(s) Signed: 08/19/2019 5:20:11 PM By: Kela Millin Entered By: Kela Millin on 08/19/2019 15:01:30

## 2019-08-19 NOTE — Progress Notes (Signed)
Katie Ford ([id number]) Visit Report for 08/19/2019 Abuse/Suicide Risk Screen Details Patient Name: Date of Service: Katie Ford, Katie Ford 08/19/2019 2:45 PM Medical Record [id number] Patient Account Number: [id number] Date of Birth/Sex: Treating RN: 05-31-1932 (86 y.o. Clearnce Sorrel Primary Care Zilda No: Margarita Rana Other Clinician: Referring Jontay Maston: Treating Nawaal Alling/Extender:Robson, Stefan Church, Elon Alas in Treatment: 0 Abuse/Suicide Risk Screen Items Answer ABUSE RISK SCREEN: Has anyone close to you tried to hurt or harm you recentlyo No Do you feel uncomfortable with anyone in your familyo No Has anyone forced you do things that you didnt want to doo No Electronic Signature(s) Signed: 08/19/2019 5:20:11 PM By: Kela Millin Entered By: Kela Millin on 08/19/2019 15:10:27 -------------------------------------------------------------------------------- Activities of Daily Living Details Patient Name: Date of Service: Katie Ford 08/19/2019 2:45 PM Medical Record [id number] Patient Account Number: [id number] Date of Birth/Sex: Treating RN: 1931/09/13 (86 y.o. Clearnce Sorrel Primary Care Sequita Wise: Margarita Rana Other Clinician: Referring Markiya Keefe: Treating Charell Faulk/Extender:Robson, Stefan Church, Elon Alas in Treatment: 0 Activities of Daily Living Items Answer Activities of Daily Living (Please select one for each item) Drive Automobile Not Able Take Medications Not Able Use Telephone Not Able Care for Appearance Not Able Use Toilet Not Able Manus Rudd / Shower Not Able Dress Self Not Able Feed Self Not Able Walk Not Able Get In / Out Bed Not Able Housework Not Able Prepare Meals Not Able Handle Money Not Able Shop for Self Not Able Electronic Signature(s) Signed: 08/19/2019 5:20:11 PM By: Kela Millin Entered By: Kela Millin on 08/19/2019  15:10:47 -------------------------------------------------------------------------------- Education Screening Details Patient Name: Date of Service: Katie Ford 08/19/2019 2:45 PM Medical Record [id number] Patient Account Number: [id number] Date of Birth/Sex: Treating RN: [birth year] (86 y.o. Clearnce Sorrel Primary Care Mayreli Alden: Margarita Rana Other Clinician: Referring Starr Urias: Treating Jaylean Buenaventura/Extender:Robson, Stefan Church, Elon Alas in Treatment: 0 Primary Learner Assessed: Caregiver patient does not Reason Patient is not Primary Learner: communicate, dementia Learning Preferences/Education Level/Primary Language Learning Preference: Explanation Preferred Language: English Cognitive Barrier Language Barrier: No Translator Needed: No Memory Deficit: No Emotional Barrier: No Cultural/Religious Beliefs Affecting Medical Care: No Physical Barrier Impaired Vision: No Impaired Hearing: No Decreased Hand dexterity: No Knowledge/Comprehension Knowledge Level: Medium Comprehension Level: Medium Ability to understand written Medium instructions: Ability to understand verbal Medium instructions: Motivation Anxiety Level: Calm Cooperation: Cooperative Education Importance: Acknowledges Need Interest in Health Problems: Asks Questions Perception: Coherent Willingness to Engage in Self- High Management Activities: Readiness to Engage in Self- High Management Activities: Electronic Signature(s) Signed: 08/19/2019 5:20:11 PM By: Kela Millin Entered By: Kela Millin on 08/19/2019 15:11:27 -------------------------------------------------------------------------------- Fall Risk Assessment Details Patient Name: Date of Service: Katie Ford 08/19/2019 2:45 PM Medical Record [id number] Patient Account Number: [id number] Date of Birth/Sex: Treating RN: [birth year] (86 y.o. Clearnce Sorrel Primary Care Torina Ey:  Margarita Rana Other Clinician: Referring Teesha Ohm: Treating Yonah Tangeman/Extender:Robson, Stefan Church, Elon Alas in Treatment: 0 Fall Risk Assessment Items Have you had 2 or more falls in the last 12 monthso 0 Yes Have you had any fall that resulted in injury in the last 12 monthso 0 No FALLS RISK SCREEN History of falling - immediate or within 3 months 25 Yes Secondary diagnosis (Do you have 2 or more medical diagnoseso) 15 Yes Ambulatory aid None/bed rest/wheelchair/nurse 0 Yes Crutches/cane/walker 0 No Furniture 0 No Intravenous therapy Access/Saline/Heparin Lock 0 No Weak (short steps with or without shuffle, stooped but able to lift head 0 No while walking, may seek  support from furniture) Impaired (short steps with shuffle, may have difficulty arising from chair, 0 No head down, impaired balance) Mental Status Oriented to own ability 0 No Overestimates or forgets limitations 15 Yes Risk Level: High Risk Score: 55 Electronic Signature(s) Signed: 08/19/2019 5:20:11 PM By: Kela Millin Entered By: Kela Millin on 08/19/2019 15:11:52 -------------------------------------------------------------------------------- Foot Assessment Details Patient Name: Date of Service: Katie Ford 08/19/2019 2:45 PM Medical Record [id number] Patient Account Number: [id number] Date of Birth/Sex: Treating RN: May 27, 1932 (86 y.o. Clearnce Sorrel Primary Care Corbyn Steedman: Margarita Rana Other Clinician: Referring Roy Tokarz: Treating Myalee Stengel/Extender:Robson, Stefan Church, Elon Alas in Treatment: 0 Foot Assessment Items Site Locations + = Sensation present, - = Sensation absent, C = Callus, U = Ulcer R = Redness, W = Warmth, M = Maceration, PU = Pre-ulcerative lesion F = Fissure, S = Swelling, D = Dryness Assessment Right: Left: Other Deformity: No No Prior Foot Ulcer: No No Prior Amputation: No No Charcot Joint: No No Ambulatory  Status: Gait: Electronic Signature(s) Signed: 08/19/2019 5:20:11 PM By: Kela Millin Entered By: Kela Millin on 08/19/2019 15:12:37 -------------------------------------------------------------------------------- Nutrition Risk Screening Details Patient Name: Date of Service: Katie Ford 08/19/2019 2:45 PM Medical Record [id number] Patient Account Number: [id number] Date of Birth/Sex: Treating RN: [birth year] (86 y.o. Clearnce Sorrel Primary Care Chenoa Luddy: Margarita Rana Other Clinician: Referring Sacheen Arrasmith: Treating Iren Whipp/Extender:Robson, Stefan Church, Elon Alas in Treatment: 0 Height (in): 66 Weight (lbs): 110 Body Mass Index (BMI): 17.8 Nutrition Risk Screening Items Score Screening NUTRITION RISK SCREEN: I have an illness or condition that made me change the kind and/or 2 Yes amount of food I eat I eat fewer than two meals per day 0 No I eat few fruits and vegetables, or milk products 0 No I have three or more drinks of beer, liquor or wine almost every day 0 No I have tooth or mouth problems that make it hard for me to eat 0 No I don't always have enough money to buy the food I need 0 No I eat alone most of the time 0 No I take three or more different prescribed or over-the-counter drugs a day 1 Yes 2 Yes Without wanting to, I have lost or gained 10 pounds in the last six months I am not always physically able to shop, cook and/or feed myself 2 Yes Nutrition Protocols Good Risk Protocol Moderate Risk Protocol Provide education on High Risk Proctocol 0 nutrition Risk Level: High Risk Score: 7 Electronic Signature(s) Signed: 08/19/2019 5:20:11 PM By: Kela Millin Entered By: Kela Millin on 08/19/2019 15:12:32

## 2019-08-19 NOTE — Progress Notes (Signed)
Katie Ford ([id number]) Visit Report for 08/19/2019 Chief Complaint Document Details Patient Name: Date of Service: Katie Ford, Katie Ford 08/19/2019 2:45 PM Medical Record [id number] Patient Account Number: [id number] Date of Birth/Sex: Treating RN: [birth year] (86 y.o. F) Primary Care Provider: Margarita Rana Other Clinician: Referring Provider: Treating Provider/Extender:Adric Wrede, Stefan Church, Elon Alas in Treatment: 0 Information Obtained from: Patient Chief Complaint 08/19/2019; patient is here for review of a large wound in the left pelvic area as well as smaller areas on the left ear and other related pressure injury Electronic Signature(s) Signed: 08/19/2019 5:54:40 PM By: Linton Ham MD Entered By: Linton Ham on 08/19/2019 16:24:24 -------------------------------------------------------------------------------- HPI Details Patient Name: Date of Service: Katie Ford 08/19/2019 2:45 PM Medical Record [id number] Patient Account Number: [id number] Date of Birth/Sex: Treating RN: 02-Dec-1931 (86 y.o. F) Primary Care Provider: Margarita Rana Other Clinician: Referring Provider: Treating Provider/Extender:Spiros Greenfeld, Stefan Church, Elon Alas in Treatment: 0 History of Present Illness HPI Description: Admission 08/19/2019 This is a very frail 86 year old woman who is actually the mother of the clinic patient. She arrived with a 2-week history of a left hip ulcer with increasing drainage. They have been using drawtex on this and changing every 2. Historically the patient apparently had a stroke in the past. She is nonambulatory and total care. She apparently has been eating minimally and not drinking very well. She also has essentially been nonverbal for a prolonged period of time Past medical history includes seizure disorder, gastroesophageal reflux disease, hypertension, hyperlipidemia, hypothyroidism, COPD, coronary artery disease  status post stents and remote CVA. Electronic Signature(s) Signed: 08/19/2019 5:54:40 PM By: Linton Ham MD Entered By: Linton Ham on 08/19/2019 17:19:16 -------------------------------------------------------------------------------- Physical Exam Details Patient Name: Date of Service: Katie Ford, Katie Ford 08/19/2019 2:45 PM Medical Record [id number] Patient Account Number: [id number] Date of Birth/Sex: Treating RN: May 22, 1932 (86 y.o. F) Primary Care Provider: Margarita Rana Other Clinician: Referring Provider: Treating Provider/Extender:Burnett Spray, Stefan Church, Elon Alas in Treatment: 0 Constitutional Patient is hypotensive.. Pulse regular and within target range for patient.Marland Kitchen Respirations regular, non-labored and within target range.. Temperature is normal and within the target range for the patient.. Patient is awake but nonverbal. Does not engage. Eyes Conjunctivae clear. No discharge.no icterus. Respiratory work of breathing is normal. Bilateral breath sounds are clear and equal in all lobes with no wheezes, rales or rhonchi.. Cardiovascular Heart sounds are distant. She appears to be dehydrated. Very poor skin turgor dry lips. Gastrointestinal (GI) Abdomen is soft and non-distended without masses or tenderness.. Integumentary (Hair, Skin) Aside from the large wounds on the brim of her left pelvis. There is what looks to be reniform purpura across several areas of her back.. Psychiatric Nonverbal. Does not engage me in any meaningful way. Notes Wound exam The major area is actually not on her hip is on the brim of her pelvis on the left. A wide area of exposed bone. There is purulent drainage very tender. She also has several purpuric areas stretched across her upper buttock. These look like angular purpura but I suppose they could be deep tissue injuries. She has a fairly deep pressure ulcer in the helix of her left ear Electronic  Signature(s) Signed: 08/19/2019 5:54:40 PM By: Linton Ham MD Entered By: Linton Ham on 08/19/2019 17:22:13 -------------------------------------------------------------------------------- Physician Orders Details Patient Name: Date of Service: CRESCENT, ARVIZO 08/19/2019 2:45 PM Medical Record [id number] Patient Account Number: [id number] Date of Birth/Sex: Treating RN: [birth year] (86 y.o. Debby Bud Primary Care Provider: Edrick Oh,  Marlow Baars Other Clinician: Referring Provider: Treating Provider/Extender:Avenly Roberge, Stefan Church, Elon Alas in Treatment: 0 Verbal / Phone Orders: No Diagnosis Coding Dressing Change Frequency Change Dressing every other day. Skin Barriers/Peri-Wound Care Skin Prep Wound Cleansing Clean wound with Wound Cleanser Primary Wound Dressing Wound #1 Left Ear Calcium Alginate with Silver Wound #2 Left Trochanter Calcium Alginate with Silver Secondary Dressing Wound #1 Left Ear Other: - bandaid Wound #2 Left Trochanter ABD pad Off-Loading Turn and reposition every 2 hours Additional Orders / Instructions Other: - family to make decisions regarding aggressive care verses hospice care. Home Health Admit to Home Health for [facility]. for wound care, palliative and hospice care. Electronic Signature(s) Signed: 08/19/2019 5:54:40 PM By: Linton Ham MD Signed: 08/19/2019 6:05:55 PM By: Deon Pilling Entered By: Deon Pilling on 08/19/2019 16:20:16 -------------------------------------------------------------------------------- Problem List Details Patient Name: Date of Service: Katie Ford, Katie Ford 08/19/2019 2:45 PM Medical Record [id number] Patient Account Number: [id number] Date of Birth/Sex: Treating RN: 06-07-1932 (86 y.o. F) Primary Care Provider: Margarita Rana Other Clinician: Referring Provider: Treating Provider/Extender:Nashley Cordoba, Stefan Church, Elon Alas in  Treatment: 0 Active Problems ICD-10 Evaluated Encounter Code Description Active Date Today Diagnosis L89.224 Pressure ulcer of left hip, stage 4 08/19/2019 No Yes M86.8X8 Other osteomyelitis, other site 08/19/2019 No Yes E86.0 Dehydration 08/19/2019 No Yes Inactive Problems Resolved Problems Electronic Signature(s) Signed: 08/19/2019 5:54:40 PM By: Linton Ham MD Entered By: Linton Ham on 08/19/2019 16:22:07 -------------------------------------------------------------------------------- Progress Note Details Patient Name: Date of Service: Katie Ford 08/19/2019 2:45 PM Medical Record [id number] Patient Account Number: [id number] Date of Birth/Sex: Treating RN: July 31, 1931 (86 y.o. F) Primary Care Provider: Margarita Rana Other Clinician: Referring Provider: Treating Provider/Extender:Vola Beneke, Stefan Church, Elon Alas in Treatment: 0 Subjective Chief Complaint Information obtained from Patient 08/19/2019; patient is here for review of a large wound in the left pelvic area as well as smaller areas on the left ear and other related pressure injury History of Present Illness (HPI) Admission 08/19/2019 This is a very frail 86 year old woman who is actually the mother of the clinic patient. She arrived with a 2-week history of a left hip ulcer with increasing drainage. They have been using drawtex on this and changing every 2. Historically the patient apparently had a stroke in the past. She is nonambulatory and total care. She apparently has been eating minimally and not drinking very well. She also has essentially been nonverbal for a prolonged period of time Past medical history includes seizure disorder, gastroesophageal reflux disease, hypertension, hyperlipidemia, hypothyroidism, COPD, coronary artery disease status post stents and remote CVA. Patient History Unable to Obtain Patient History due to Dementia. Allergies Aricept, celecoxib (Reaction:  tachycardia), codeine (Reaction: tachycardia), FD and C no.5 (tartrazine) (Reaction: tachycardia), aspirin (Reaction: tachycardia when given with tartrazine) Family History Cancer - Siblings, Diabetes - Siblings, Heart Disease - Siblings, Hypertension - Siblings, Kidney Disease - Father, Lung Disease - Father, Seizures - Mother,Father,Siblings, Stroke - Mother,Father,Siblings, No family history of Hereditary Spherocytosis, Thyroid Problems, Tuberculosis. Social History Former smoker, Marital Status - Widowed, Alcohol Use - Never, Drug Use - No History, Caffeine Use - Never. Medical History Eyes Patient has history of Cataracts Hematologic/Lymphatic Patient has history of Anemia Respiratory Patient has history of Asthma Cardiovascular Patient has history of Arrhythmia, Coronary Artery Disease, Hypertension, Myocardial Infarction Integumentary (Skin) Denies history of History of Burn Musculoskeletal Patient has history of Rheumatoid Arthritis Neurologic Patient has history of Dementia Hospitalization/Surgery History - knee surgery. -  gallbladder removal. - hysterectomy. - stenets from [place]. Medical And Surgical History Notes Gastrointestinal gallbladder removed Integumentary (Skin) scleroderma Review of Systems (ROS) Constitutional Symptoms (General Health) Denies complaints or symptoms of Fatigue, Fever, Chills, Marked Weight Change. Eyes Complains or has symptoms of Glasses / Contacts - glasses. Ear/Nose/Mouth/Throat Denies complaints or symptoms of Chronic sinus problems or rhinitis. Respiratory Denies complaints or symptoms of Chronic or frequent coughs, Shortness of Breath. Cardiovascular Denies complaints or symptoms of Chest pain. Gastrointestinal Denies complaints or symptoms of Frequent diarrhea, Nausea, Vomiting. Endocrine Denies complaints or symptoms of Heat/cold intolerance. Genitourinary Denies complaints or symptoms of Frequent urination. Integumentary  (Skin) Complains or has symptoms of Wounds, wound to left hip and right ear Musculoskeletal Denies complaints or symptoms of Muscle Pain, Muscle Weakness. Neurologic Denies complaints or symptoms of Numbness/parasthesias. Psychiatric Denies complaints or symptoms of Claustrophobia, Suicidal. Objective Constitutional Patient is hypotensive.. Pulse regular and within target range for patient.Marland Kitchen Respirations regular, non-labored and within target range.. Temperature is normal and within the target range for the patient.. Patient is awake but nonverbal. Does not engage. Vitals Time Taken: 3:00 PM, Height: 66 in, Source: Stated, Weight: 110 lbs, Source: Stated, BMI: 17.8, Temperature: 98 F, Pulse: 66 bpm, Respiratory Rate: 20 breaths/min, Blood Pressure: 93/47 mmHg. Eyes Conjunctivae clear. No discharge.no icterus. Respiratory work of breathing is normal. Bilateral breath sounds are clear and equal in all lobes with no wheezes, rales or rhonchi.. Cardiovascular Heart sounds are distant. She appears to be dehydrated. Very poor skin turgor dry lips. Gastrointestinal (GI) Abdomen is soft and non-distended without masses or tenderness.Marland Kitchen Psychiatric Nonverbal. Does not engage me in any meaningful way. General Notes: Wound exam ooThe major area is actually not on her hip is on the brim of her pelvis on the left. A wide area of exposed bone. There is purulent drainage very tender. ooShe also has several purpuric areas stretched across her upper buttock. These look like angular purpura but I suppose they could be deep tissue injuries. ooShe has a fairly deep pressure ulcer in the helix of her left ear Integumentary (Hair, Skin) Aside from the large wounds on the brim of her left pelvis. There is what looks to be reniform purpura across several areas of her back.. Wound #1 status is Open. Original cause of wound was Gradually Appeared. The wound is located on the Left Ear. The wound measures  1.3cm length x 0.7cm width x 0.1cm depth; 0.715cm^2 area and 0.071cm^3 volume. There is no tunneling or undermining noted. There is a none present amount of drainage noted. The wound margin is distinct with the outline attached to the wound base. There is no granulation within the wound bed. There is a large (67- 100%) amount of necrotic tissue within the wound bed including Eschar and Adherent Slough. Wound #2 status is Open. Original cause of wound was Gradually Appeared. The wound is located on the Left Trochanter. The wound measures 15.4cm length x 10cm width x 3.7cm depth; 120.951cm^2 area and 447.52cm^3 volume. There is bone, muscle, Fat Layer (Subcutaneous Tissue) Exposed, and fascia exposed. There is no tunneling noted, however, there is undermining starting at 3:00 and ending at 8:00 with a maximum distance of 4.7cm. There is a large amount of purulent drainage noted. Foul odor after cleansing was noted. The wound margin is well defined and not attached to the wound base. There is medium (34-66%) pink granulation within the wound bed. There is a medium (34-66%) amount of necrotic tissue within the wound bed  including [place]. Assessment Active Problems ICD-10 Pressure ulcer of left hip, stage 4 Other osteomyelitis, other site Dehydration Plan Dressing Change Frequency: Change Dressing every other day. Skin Barriers/Peri-Wound Care: Skin Prep Wound Cleansing: Clean wound with Wound Cleanser Primary Wound Dressing: Wound #1 Left Ear: Calcium Alginate with Silver Wound #2 Left Trochanter: Calcium Alginate with Silver Secondary Dressing: Wound #1 Left Ear: Other: - bandaid Wound #2 Left Trochanter: ABD pad Off-Loading: Turn and reposition every 2 hours Additional Orders / Instructions: Other: - family to make decisions regarding aggressive care verses hospice care. Home Health: Admit to Home Health for [place]. for wound care,  palliative and hospice care. 1. Huge pressure ulcer which is actually in the left pelvic rim. Large swath of exposed bone. Purulent drainage odor and surrounding tenderness. 2. I think this woman is probably 5 to 10% dehydrated. She has not been eating 3. Picture of probably advanced dementia although I did not verify the history. 4. I painted a picture for this family including the son who is my patient of either an aggressive hospitalization that would probably include IVs, IV antibiotics, surgery to debride the wound and possibly nutritional support for I felt it would be reasonable to bring hospice in the home to keep this patient comfortable. I think she is probably entering the terminal stages of probably dementia. 5. We had an intense discussion with the patient's grandnephew who I also know and the patient's son on the phone. They spoke to other family members they agreed to hospice care at home as would be in keeping with the patient's wishes. Towards that end I called the patient's primary office to tell them of the situation and we will refer them to hospice of [facility] as they live in [place]) Signed: 08/19/2019 5:54:40 PM By: Linton Ham MD Entered By: Linton Ham on 08/19/2019 17:25:03 -------------------------------------------------------------------------------- HxROS Details Patient Name: Date of Service: Katie Ford 08/19/2019 2:45 PM Medical Record [id number] Patient Account Number: [id number] Date of Birth/Sex: Treating RN: [birth year] (86 y.o. Clearnce Sorrel Primary Care Provider: Margarita Rana Other Clinician: Referring Provider: Treating Provider/Extender:Valeska Haislip, Stefan Church, Elon Alas in Treatment: 0 Unable to Obtain Patient History due to Dementia Constitutional Symptoms (General Health) Complaints and Symptoms: Negative for: Fatigue; Fever; Chills; Marked Weight Change Eyes Complaints  and Symptoms: Positive for: Glasses / Contacts - glasses Medical History: Positive for: Cataracts Ear/Nose/Mouth/Throat Complaints and Symptoms: Negative for: Chronic sinus problems or rhinitis Respiratory Complaints and Symptoms: Negative for: Chronic or frequent coughs; Shortness of Breath Medical History: Positive for: Asthma Cardiovascular Complaints and Symptoms: Negative for: Chest pain Medical History: Positive for: Arrhythmia; Coronary Artery Disease; Hypertension; Myocardial Infarction Gastrointestinal Complaints and Symptoms: Negative for: Frequent diarrhea; Nausea; Vomiting Medical History: Past Medical History Notes: gallbladder removed Endocrine Complaints and Symptoms: Negative for: Heat/cold intolerance Genitourinary Complaints and Symptoms: Negative for: Frequent urination Integumentary (Skin) Complaints and Symptoms: Positive for: Wounds Review of System Notes: wound to left hip and right ear Medical History: Negative for: History of Burn Past Medical History Notes: scleroderma Musculoskeletal Complaints and Symptoms: Negative for: Muscle Pain; Muscle Weakness Medical History: Positive for: Rheumatoid Arthritis Neurologic Complaints and Symptoms: Negative for: Numbness/parasthesias Medical History: Positive for: Dementia Psychiatric Complaints and Symptoms: Negative for: Claustrophobia; Suicidal Hematologic/Lymphatic Medical History: Positive for: Anemia Immunological Oncologic HBO Extended History Items Eyes: Cataracts Immunizations Pneumococcal Vaccine: Received Pneumococcal Vaccination: Yes Implantable Devices None Hospitalization / Surgery History Type of Hospitalization/Surgery knee  surgery gallbladder removal hysterectomy stenets from [place] Family and Social History Cancer: Yes - Siblings; Diabetes: Yes - Siblings; Heart Disease: Yes - Siblings; Hereditary Spherocytosis: No; Hypertension: Yes - Siblings; Kidney Disease: Yes -  Father; Lung Disease: Yes - Father; Seizures: Yes - Mother,Father,Siblings; Stroke: Yes - Mother,Father,Siblings; Thyroid Problems: No; Tuberculosis: No; Former smoker; Marital Status - Widowed; Alcohol Use: Never; Drug Use: No History; Caffeine Use: Never; Financial Concerns: No; Food, Clothing or Shelter Needs: No; Support System Lacking: No; Transportation Concerns: No Electronic Signature(s) Signed: 08/19/2019 5:20:11 PM By: Kela Millin Signed: 08/19/2019 5:54:40 PM By: Linton Ham MD Entered By: Kela Millin on 08/19/2019 15:09:44 -------------------------------------------------------------------------------- SuperBill Details Patient Name: Date of Service: Katie Ford 08/19/2019 Medical Record [id number] Patient Account Number: [id number] Date of Birth/Sex: Treating RN: 11-Aug-1931 (86 y.o. F) Primary Care Provider: Margarita Rana Other Clinician: Referring Provider: Treating Provider/Extender:Aerica Rincon, Stefan Church, Elon Alas in Treatment: 0 Diagnosis Coding ICD-10 Codes Code Description [phone] Pressure ulcer of left hip, stage 4 M86.8X8 Other osteomyelitis, other site E86.0 Dehydration Facility Procedures The patient participates with Medicare or their insurance follows the Medicare Facility Guidelines: CPT4 Code Description Modifier Quantity [id number] [facility] VISIT-LEV 5 EST PT 1 Physician Procedures CPT4 Code: [id number] Description: [id number] - WC PHYS LEVEL 5 - NEW PT ICD-10 Diagnosis Description L89.224 Pressure ulcer of left hip, stage 4 M86.8X8 Other osteomyelitis, other site E86.0 Dehydration Modifier: Quantity: 1 Electronic Signature(s) Signed: 08/19/2019 5:54:40 PM By: Linton Ham MD Signed: 08/19/2019 6:05:55 PM By: Deon Pilling Entered By: Deon Pilling on 08/19/2019 17:35:31

## 2019-09-22 NOTE — Progress Notes (Signed)
I have reviewed and agreed above plan. 

## 2019-10-21 DEATH — deceased

## 2020-03-22 ENCOUNTER — Ambulatory Visit: Payer: Medicare Other | Admitting: Neurology
